# Patient Record
Sex: Female | Born: 1940
Health system: Southern US, Community
[De-identification: ages and names within clinical notes are randomized; demographics above are authoritative.]

## PROBLEM LIST (undated history)

## (undated) DIAGNOSIS — I499 Cardiac arrhythmia, unspecified: Secondary | ICD-10-CM

## (undated) DIAGNOSIS — R0600 Dyspnea, unspecified: Secondary | ICD-10-CM

## (undated) DIAGNOSIS — M109 Gout, unspecified: Secondary | ICD-10-CM

## (undated) DIAGNOSIS — D0510 Intraductal carcinoma in situ of unspecified breast: Secondary | ICD-10-CM

## (undated) DIAGNOSIS — Z923 Personal history of irradiation: Secondary | ICD-10-CM

## (undated) DIAGNOSIS — I2510 Atherosclerotic heart disease of native coronary artery without angina pectoris: Secondary | ICD-10-CM

## (undated) DIAGNOSIS — I358 Other nonrheumatic aortic valve disorders: Secondary | ICD-10-CM

## (undated) DIAGNOSIS — I4891 Unspecified atrial fibrillation: Secondary | ICD-10-CM

## (undated) DIAGNOSIS — I5030 Unspecified diastolic (congestive) heart failure: Secondary | ICD-10-CM

## (undated) DIAGNOSIS — E236 Other disorders of pituitary gland: Secondary | ICD-10-CM

## (undated) DIAGNOSIS — I10 Essential (primary) hypertension: Secondary | ICD-10-CM

## (undated) DIAGNOSIS — E785 Hyperlipidemia, unspecified: Secondary | ICD-10-CM

## (undated) DIAGNOSIS — I480 Paroxysmal atrial fibrillation: Secondary | ICD-10-CM

## (undated) DIAGNOSIS — K219 Gastro-esophageal reflux disease without esophagitis: Secondary | ICD-10-CM

## (undated) DIAGNOSIS — C50919 Malignant neoplasm of unspecified site of unspecified female breast: Secondary | ICD-10-CM

## (undated) DIAGNOSIS — R7303 Prediabetes: Secondary | ICD-10-CM

## (undated) DIAGNOSIS — C4490 Unspecified malignant neoplasm of skin, unspecified: Secondary | ICD-10-CM

## (undated) HISTORY — DX: Intraductal carcinoma in situ of unspecified breast: D05.10

## (undated) HISTORY — DX: Other disorders of pituitary gland: E23.6

## (undated) HISTORY — PX: CHOLECYSTECTOMY: SHX55

## (undated) HISTORY — PX: CATARACT EXTRACTION: SUR2

## (undated) HISTORY — DX: Other nonrheumatic aortic valve disorders: I35.8

## (undated) HISTORY — DX: Hyperlipidemia, unspecified: E78.5

## (undated) HISTORY — DX: Paroxysmal atrial fibrillation: I48.0

## (undated) HISTORY — DX: Atherosclerotic heart disease of native coronary artery without angina pectoris: I25.10

## (undated) HISTORY — PX: ELECTROPHYSIOLOGIC STUDY: SHX172A

## (undated) HISTORY — DX: Gout, unspecified: M10.9

## (undated) HISTORY — PX: OTHER SURGICAL HISTORY: SHX169

## (undated) HISTORY — DX: Prediabetes: R73.03

## (undated) HISTORY — DX: Essential (primary) hypertension: I10

## (undated) HISTORY — PX: ABDOMINAL HYSTERECTOMY: SHX81

## (undated) HISTORY — PX: GASTRIC FUNDOPLICATION: SHX226

---

## 2004-05-17 ENCOUNTER — Ambulatory Visit: Payer: Self-pay | Admitting: Unknown Physician Specialty

## 2004-06-14 ENCOUNTER — Ambulatory Visit: Payer: Self-pay | Admitting: Unknown Physician Specialty

## 2004-06-16 ENCOUNTER — Ambulatory Visit: Payer: Self-pay | Admitting: Unknown Physician Specialty

## 2004-07-28 ENCOUNTER — Ambulatory Visit: Payer: Self-pay | Admitting: Surgery

## 2005-03-28 ENCOUNTER — Ambulatory Visit: Payer: Self-pay | Admitting: Internal Medicine

## 2005-04-14 ENCOUNTER — Ambulatory Visit: Payer: Self-pay | Admitting: Surgery

## 2005-08-29 ENCOUNTER — Ambulatory Visit: Payer: Self-pay | Admitting: Rheumatology

## 2006-05-04 ENCOUNTER — Ambulatory Visit: Payer: Self-pay | Admitting: Internal Medicine

## 2007-05-09 ENCOUNTER — Ambulatory Visit: Payer: Self-pay | Admitting: Internal Medicine

## 2007-10-24 ENCOUNTER — Ambulatory Visit: Payer: Self-pay | Admitting: Cardiology

## 2008-05-21 ENCOUNTER — Ambulatory Visit: Payer: Self-pay | Admitting: Internal Medicine

## 2009-04-08 ENCOUNTER — Ambulatory Visit: Payer: Self-pay | Admitting: Podiatry

## 2009-07-15 ENCOUNTER — Ambulatory Visit: Payer: Self-pay | Admitting: Internal Medicine

## 2009-07-23 ENCOUNTER — Ambulatory Visit: Payer: Self-pay | Admitting: Internal Medicine

## 2009-08-01 DIAGNOSIS — C4490 Unspecified malignant neoplasm of skin, unspecified: Secondary | ICD-10-CM

## 2009-08-01 HISTORY — DX: Unspecified malignant neoplasm of skin, unspecified: C44.90

## 2009-08-18 ENCOUNTER — Ambulatory Visit: Payer: Self-pay | Admitting: Unknown Physician Specialty

## 2010-01-12 ENCOUNTER — Ambulatory Visit: Payer: Self-pay | Admitting: Internal Medicine

## 2010-07-19 ENCOUNTER — Ambulatory Visit: Payer: Self-pay | Admitting: Internal Medicine

## 2011-07-27 ENCOUNTER — Ambulatory Visit: Payer: Self-pay | Admitting: Family Medicine

## 2011-08-04 ENCOUNTER — Ambulatory Visit: Payer: Self-pay | Admitting: Family Medicine

## 2012-03-22 ENCOUNTER — Ambulatory Visit: Payer: Self-pay | Admitting: Family Medicine

## 2012-08-02 ENCOUNTER — Ambulatory Visit: Payer: Self-pay | Admitting: Family Medicine

## 2013-08-01 DIAGNOSIS — C50919 Malignant neoplasm of unspecified site of unspecified female breast: Secondary | ICD-10-CM

## 2013-08-01 HISTORY — DX: Malignant neoplasm of unspecified site of unspecified female breast: C50.919

## 2013-08-01 HISTORY — PX: BREAST BIOPSY: SHX20

## 2013-08-16 ENCOUNTER — Ambulatory Visit: Payer: Self-pay | Admitting: Family Medicine

## 2013-08-19 ENCOUNTER — Ambulatory Visit: Payer: Self-pay | Admitting: Family Medicine

## 2013-08-26 ENCOUNTER — Ambulatory Visit: Payer: Self-pay | Admitting: Surgery

## 2013-08-27 LAB — PATHOLOGY REPORT

## 2013-09-05 ENCOUNTER — Ambulatory Visit: Payer: Self-pay | Admitting: Surgery

## 2013-09-05 LAB — CBC
HCT: 40.7 % (ref 35.0–47.0)
HGB: 13.6 g/dL (ref 12.0–16.0)
MCH: 31.5 pg (ref 26.0–34.0)
MCHC: 33.4 g/dL (ref 32.0–36.0)
MCV: 94 fL (ref 80–100)
Platelet: 248 10*3/uL (ref 150–440)
RBC: 4.32 10*6/uL (ref 3.80–5.20)
RDW: 15.3 % — ABNORMAL HIGH (ref 11.5–14.5)
WBC: 7.9 10*3/uL (ref 3.6–11.0)

## 2013-09-12 ENCOUNTER — Ambulatory Visit: Payer: Self-pay | Admitting: Surgery

## 2013-09-23 HISTORY — PX: BREAST LUMPECTOMY: SHX2

## 2013-09-23 LAB — PATHOLOGY REPORT

## 2013-09-24 ENCOUNTER — Ambulatory Visit: Payer: Self-pay | Admitting: Oncology

## 2013-09-29 ENCOUNTER — Ambulatory Visit: Payer: Self-pay | Admitting: Oncology

## 2013-10-01 ENCOUNTER — Ambulatory Visit: Payer: Self-pay | Admitting: Oncology

## 2013-10-23 LAB — CBC CANCER CENTER
BASOS PCT: 0.8 %
Basophil #: 0 x10 3/mm (ref 0.0–0.1)
Eosinophil #: 0.2 x10 3/mm (ref 0.0–0.7)
Eosinophil %: 3.7 %
HCT: 40.6 % (ref 35.0–47.0)
HGB: 13.1 g/dL (ref 12.0–16.0)
LYMPHS ABS: 1.6 x10 3/mm (ref 1.0–3.6)
Lymphocyte %: 26.7 %
MCH: 30.3 pg (ref 26.0–34.0)
MCHC: 32.2 g/dL (ref 32.0–36.0)
MCV: 94 fL (ref 80–100)
MONOS PCT: 9.1 %
Monocyte #: 0.5 x10 3/mm (ref 0.2–0.9)
NEUTROS ABS: 3.6 x10 3/mm (ref 1.4–6.5)
NEUTROS PCT: 59.7 %
PLATELETS: 254 x10 3/mm (ref 150–440)
RBC: 4.31 10*6/uL (ref 3.80–5.20)
RDW: 15.4 % — ABNORMAL HIGH (ref 11.5–14.5)
WBC: 6 x10 3/mm (ref 3.6–11.0)

## 2013-10-30 ENCOUNTER — Ambulatory Visit: Payer: Self-pay | Admitting: Oncology

## 2013-10-30 LAB — CBC CANCER CENTER
BASOS PCT: 0.6 %
Basophil #: 0 x10 3/mm (ref 0.0–0.1)
EOS PCT: 3.7 %
Eosinophil #: 0.2 x10 3/mm (ref 0.0–0.7)
HCT: 39.6 % (ref 35.0–47.0)
HGB: 12.9 g/dL (ref 12.0–16.0)
LYMPHS PCT: 27.3 %
Lymphocyte #: 1.7 x10 3/mm (ref 1.0–3.6)
MCH: 30.8 pg (ref 26.0–34.0)
MCHC: 32.7 g/dL (ref 32.0–36.0)
MCV: 94 fL (ref 80–100)
MONO ABS: 0.6 x10 3/mm (ref 0.2–0.9)
MONOS PCT: 9.9 %
NEUTROS ABS: 3.7 x10 3/mm (ref 1.4–6.5)
Neutrophil %: 58.5 %
PLATELETS: 230 x10 3/mm (ref 150–440)
RBC: 4.2 10*6/uL (ref 3.80–5.20)
RDW: 15.7 % — ABNORMAL HIGH (ref 11.5–14.5)
WBC: 6.3 x10 3/mm (ref 3.6–11.0)

## 2013-11-06 LAB — CBC CANCER CENTER
Basophil #: 0 x10 3/mm (ref 0.0–0.1)
Basophil %: 0.8 %
EOS PCT: 4.2 %
Eosinophil #: 0.2 x10 3/mm (ref 0.0–0.7)
HCT: 39 % (ref 35.0–47.0)
HGB: 12.7 g/dL (ref 12.0–16.0)
Lymphocyte #: 1 x10 3/mm (ref 1.0–3.6)
Lymphocyte %: 19.9 %
MCH: 30.7 pg (ref 26.0–34.0)
MCHC: 32.6 g/dL (ref 32.0–36.0)
MCV: 94 fL (ref 80–100)
MONO ABS: 0.6 x10 3/mm (ref 0.2–0.9)
Monocyte %: 12.4 %
NEUTROS ABS: 3.3 x10 3/mm (ref 1.4–6.5)
Neutrophil %: 62.7 %
Platelet: 229 x10 3/mm (ref 150–440)
RBC: 4.15 10*6/uL (ref 3.80–5.20)
RDW: 15.1 % — AB (ref 11.5–14.5)
WBC: 5.2 x10 3/mm (ref 3.6–11.0)

## 2013-11-13 LAB — CBC CANCER CENTER
Basophil #: 0.1 x10 3/mm (ref 0.0–0.1)
Basophil %: 0.8 %
EOS ABS: 0.3 x10 3/mm (ref 0.0–0.7)
EOS PCT: 4.1 %
HCT: 39.4 % (ref 35.0–47.0)
HGB: 13 g/dL (ref 12.0–16.0)
LYMPHS PCT: 18.8 %
Lymphocyte #: 1.2 x10 3/mm (ref 1.0–3.6)
MCH: 31.1 pg (ref 26.0–34.0)
MCHC: 32.9 g/dL (ref 32.0–36.0)
MCV: 95 fL (ref 80–100)
MONOS PCT: 9 %
Monocyte #: 0.6 x10 3/mm (ref 0.2–0.9)
NEUTROS ABS: 4.5 x10 3/mm (ref 1.4–6.5)
NEUTROS PCT: 67.3 %
PLATELETS: 242 x10 3/mm (ref 150–440)
RBC: 4.17 10*6/uL (ref 3.80–5.20)
RDW: 15.3 % — ABNORMAL HIGH (ref 11.5–14.5)
WBC: 6.6 x10 3/mm (ref 3.6–11.0)

## 2013-11-20 LAB — CBC CANCER CENTER
Basophil #: 0.1 x10 3/mm (ref 0.0–0.1)
Basophil %: 1 %
EOS PCT: 3.3 %
Eosinophil #: 0.2 x10 3/mm (ref 0.0–0.7)
HCT: 40.4 % (ref 35.0–47.0)
HGB: 13.2 g/dL (ref 12.0–16.0)
LYMPHS PCT: 15.6 %
Lymphocyte #: 0.9 x10 3/mm — ABNORMAL LOW (ref 1.0–3.6)
MCH: 30.6 pg (ref 26.0–34.0)
MCHC: 32.6 g/dL (ref 32.0–36.0)
MCV: 94 fL (ref 80–100)
MONO ABS: 0.7 x10 3/mm (ref 0.2–0.9)
Monocyte %: 12.5 %
NEUTROS PCT: 67.6 %
Neutrophil #: 3.9 x10 3/mm (ref 1.4–6.5)
PLATELETS: 219 x10 3/mm (ref 150–440)
RBC: 4.3 10*6/uL (ref 3.80–5.20)
RDW: 15.5 % — ABNORMAL HIGH (ref 11.5–14.5)
WBC: 5.8 x10 3/mm (ref 3.6–11.0)

## 2013-11-27 LAB — CBC CANCER CENTER
Basophil #: 0 x10 3/mm (ref 0.0–0.1)
Basophil %: 0.9 %
Eosinophil #: 0.2 x10 3/mm (ref 0.0–0.7)
Eosinophil %: 4.1 %
HCT: 37.3 % (ref 35.0–47.0)
HGB: 12.7 g/dL (ref 12.0–16.0)
Lymphocyte #: 0.8 x10 3/mm — ABNORMAL LOW (ref 1.0–3.6)
Lymphocyte %: 14.8 %
MCH: 31.4 pg (ref 26.0–34.0)
MCHC: 34.1 g/dL (ref 32.0–36.0)
MCV: 92 fL (ref 80–100)
Monocyte #: 0.6 x10 3/mm (ref 0.2–0.9)
Monocyte %: 10.8 %
Neutrophil #: 3.9 x10 3/mm (ref 1.4–6.5)
Neutrophil %: 69.4 %
Platelet: 221 x10 3/mm (ref 150–440)
RBC: 4.06 10*6/uL (ref 3.80–5.20)
RDW: 14.9 % — AB (ref 11.5–14.5)
WBC: 5.6 x10 3/mm (ref 3.6–11.0)

## 2013-11-29 ENCOUNTER — Ambulatory Visit: Payer: Self-pay | Admitting: Oncology

## 2013-12-30 ENCOUNTER — Ambulatory Visit: Payer: Self-pay | Admitting: Oncology

## 2014-03-04 ENCOUNTER — Ambulatory Visit: Payer: Self-pay | Admitting: Oncology

## 2014-03-14 DIAGNOSIS — Z9889 Other specified postprocedural states: Secondary | ICD-10-CM | POA: Insufficient documentation

## 2014-03-14 DIAGNOSIS — C50919 Malignant neoplasm of unspecified site of unspecified female breast: Secondary | ICD-10-CM | POA: Insufficient documentation

## 2014-03-14 DIAGNOSIS — K219 Gastro-esophageal reflux disease without esophagitis: Secondary | ICD-10-CM | POA: Insufficient documentation

## 2014-04-01 ENCOUNTER — Ambulatory Visit: Payer: Self-pay | Admitting: Oncology

## 2014-06-06 ENCOUNTER — Ambulatory Visit: Payer: Self-pay | Admitting: Oncology

## 2014-07-01 ENCOUNTER — Ambulatory Visit: Payer: Self-pay | Admitting: Oncology

## 2014-07-16 DIAGNOSIS — R1310 Dysphagia, unspecified: Secondary | ICD-10-CM | POA: Insufficient documentation

## 2014-07-16 DIAGNOSIS — Z8371 Family history of colonic polyps: Secondary | ICD-10-CM | POA: Insufficient documentation

## 2014-08-18 ENCOUNTER — Ambulatory Visit: Payer: Self-pay | Admitting: Oncology

## 2014-08-29 ENCOUNTER — Ambulatory Visit: Payer: Self-pay | Admitting: Unknown Physician Specialty

## 2014-11-22 NOTE — Consult Note (Signed)
Reason for Visit: This 74 year old Female patient presents to the clinic for initial evaluation of  breast cancer .   Referred by Dr. Tamala Julian.  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 to receive whole breast radiation followed by tamoxifen therapy  Pathology Report pathology report reviewed   Imaging Report mammograms reviewed   Referral Report clinical notes reviewed   Planned Treatment Regimen whole breast radiation followed by tamoxifen therapy   HPI   patient is a pleasant 74 year old female who presents with an abnormal mammogram of left breast showing approximately 3 mm focus of slightly heterogeneous calcifications in the area quadrant of left breast for which stereotactic guided biopsy was recommended.she underwent biopsy which was positive for ductal carcinoma in situ. One aunt had a wide local excision for a 1.9 cm region of grade 3 ductal carcinoma in situ ER PR positive. Margins were clear at less than 1 mm. Patient tolerated her surgery well. She's been seen by medical oncology and recommendation for tamoxifen therapy has been made. She specifically denies breast tenderness cough or bone pain.  Past Hx:    HTN:    Breast Cancer:   Past, Family and Social History:  Past Medical History positive   Cardiovascular coronary artery disease; hyperlipidemia; hypertension   Endocrine borderline diabetes   Past Surgical History cholecystectomy; hysterectomy, fundoplication   Family History noncontributory   Social History noncontributory   Additional Past Medical and Surgical History accompanied by husband today   Allergies:   No Known Allergies:   Home Meds:  Home Medications: Medication Instructions Status  hydrochlorothiazide 25 mg oral tablet 1 tab(s) orally once a day Active  losartan 50 mg oral tablet 1 tab(s) orally once a day (in the morning) Active   allopurinol 300 mg oral tablet 1 tab(s) orally once a day Active  atorvastatin 20 mg oral tablet 1 tab(s) orally once a day (at bedtime) Active  Metoprolol Succinate ER 50 mg oral tablet, extended release 1 tab(s) orally once a day (in the morning) Active  Aspir 81 81 mg oral tablet 1 tab(s) orally once a day Active   Review of Systems:  General negative   Performance Status (ECOG) 0   Skin negative   Breast see HPI   Ophthalmologic negative   ENMT negative   Respiratory and Thorax negative   Cardiovascular negative   Gastrointestinal negative   Genitourinary negative   Musculoskeletal negative   Neurological negative   Psychiatric negative   Hematology/Lymphatics negative   Endocrine negative   Allergic/Immunologic negative   Review of Systems   review of systems obtained from nurses notes.  Nursing Notes:  Nursing Vital Signs and Chemo Nursing Nursing Notes: *CC Vital Signs Flowsheet:   04-Mar-15 13:11  Temp Temperature 96.5  Pulse Pulse 59  Respirations Respirations 20  SBP SBP 162  DBP DBP 92  Pain Scale (0-10)  0  Current Weight (kg) (kg) 84.9  Height (cm) centimeters 156  BSA (m2) 1.8   Physical Exam:  General/Skin/HEENT:  General normal   Skin normal   Eyes normal   ENMT normal   Head and Neck normal   Additional PE well-developed slightly obese female in NAD. She status post wide local excision of left breast in the lateral portion of the breast which is well healed. No dominant mass or nodularity is noted in either breast into position examined. No axillary or  supraclavicular adenopathy is appreciated. Lungs are clear to A&P cardiac examination shows regular rate and rhythm.   Breasts/Resp/CV/GI/GU:  Respiratory and Thorax normal   Cardiovascular normal   Gastrointestinal normal   Genitourinary normal   MS/Neuro/Psych/Lymph:  Musculoskeletal normal   Neurological normal   Lymphatics normal   Other Results:  Radiology  Results: LabUnknown:    19-Jan-15 08:42, Digital Additional Views Lt Breast [facility])  PACS Image   [facility]:  Digital Additional Views Lt Breast (SCR)   REASON FOR EXAM:    AV LT CALCIFICATION  COMMENTS:       PROCEDURE: MAM - MAM DGTL ADD VW LT  SCR  - Aug 19 2013  8:42AM     CLINICAL DATA:  74 year old female with developing left breast  calcifications on screening mammogram.    EXAM:  DIGITAL DIAGNOSTIC  LEFT MAMMOGRAM    COMPARISON:  08/16/2013 and prior mammograms dating back to  02/12/2004    ACR Breast Density Category c: The breast tissue is heterogeneously  dense, which may obscure small masses.    FINDINGS:  Magnification views of the left breast demonstrate a 3 x 6 x 4 mm  group of slightly heterogeneous calcifications within the outer left  breast, middle third. There are a few linear forms present. No  associated mass or distortion identified.     IMPRESSION:  Suspicious left breast calcifications - stereotactic guided biopsy  is recommended. Marland Kitchen    RECOMMENDATION:  Stereotactic guided left breast biopsy, which will be arranged by  our department and the patient informed.  I have discussed the findings and recommendations with the patient.  Results were also provided in writing at the conclusion of the  visit. If applicable, a reminder letter will be sent to the patient  regarding the next appointment.    BI-RADS CATEGORY  4: Suspicious abnormality - biopsy should be  considered.      Electronically Signed    By: Hassan Rowan M.D.    On: 08/19/2013 15:18         Verified By: Lura Em, M.D.,   Relevent Results:   Relevant Scans and Labs mammograms reviewed.   Assessment and Plan: Impression:   ductal carcinoma in situ high-grade status post wide local excision of the left breast in 74 year old female tumor is ER/PR positive. Plan:   at this time I recommended whole breast radiation therapy. We'll plan on delivering 5000 cGy over 5 weeks and  boosting her scar another 1600 cGy based on the close margin. Risks and benefits of treatment including skin reaction, fatigue, inclusion of possible superficial lung, possible alteration of blood counts all were explained in detail to the patient. She seems to comprehend my treatment plan well. I have set her up for CT simulation later this week. Patient will also be candidate for tamoxifen therapy after completion of radiation.  I would like to take this opportunity for allowing me to participate in the care of your patient..  CC Referral:  cc: Dr. Sammie Bench, Richarda Overlie   Electronic Signatures: Baruch Gouty, Roda Shutters (MD)  (Signed 04-Mar-15 14:44)  Authored: HPI, Diagnosis, Past Hx, PFSH, Allergies, Home Meds, ROS, Nursing Notes, Physical Exam, Other Results, Relevent Results, Encounter Assessment and Plan, CC Referring Physician   Last Updated: 04-Mar-15 14:44 by Armstead Peaks (MD)

## 2014-11-22 NOTE — Op Note (Signed)
PATIENT NAME:  Charlene Coleman, Charlene Coleman MR#:  [id number] DATE OF BIRTH:  [birth year]  DATE OF PROCEDURE:  09/12/2013  PREOPERATIVE DIAGNOSIS: Ductal carcinoma in situ of the left breast.   POSTOPERATIVE DIAGNOSIS: Ductal carcinoma in situ of the left breast.   PROCEDURE: Left partial mastectomy.   SURGEON: Loreli Dollar, MD   ANESTHESIA: General.   INDICATIONS: This 74 year old female recently had a mammogram depicting a 6 mm cluster of microcalcifications in the lateral aspect of the left breast. Stereotactic needle biopsy demonstrated ductal carcinoma in situ, and surgery was recommended for definitive treatment.   DESCRIPTION OF PROCEDURE: The patient was placed on the operating table in the supine position under general anesthesia. The dressing was removed from the left breast, exposing the Kopans wire, which entered the breast at approximately 4 o'clock position. The wire was cut 2 cm from the skin. The breast was prepared with ChloraPrep and draped in a sterile manner.   I reviewed the mammogram images, seeing the location of the biopsy marker and proximity to the wire. A curvilinear incision was made from approximately 2 o'clock to 5 o'clock position and dissected down. Did remove a narrow ellipse of skin and dissected down to the Kopans wire. Next, a sample of tissue surrounding the Kopans wire was resected and removed the Kopans wire with the specimen. There was a mild degree of firmness within the specimen. This was submitted for specimen mammogram and pathology.   The wound was inspected. There was no remaining mass within the wound. Several small bleeding points were cauterized. Hemostasis was subsequently intact. Subcutaneous tissues were infiltrated with 0.5% Sensorcaine with epinephrine. The subcutaneous tissues were approximated with interrupted 4-0 chromic. The skin was closed with running 4-0 Monocryl subcuticular suture.   The radiologist called to report that the biopsy marker was seen  in the specimen, and later the pathologist called back to report that the margins appeared to be satisfactory. It is noted that we did mark the specimen with the margin map to mark the medial, lateral, cranial, caudal and deep margins, and also the 5 o'clock end of the skin ellipse was tagged with a nylon stitch, and this was for the pathologist's orientation, and specimen was submitted for routine studies as well.   The wound was treated with Dermabond. The patient tolerated surgery satisfactorily and was then prepared for transfer to the recovery room.   ____________________________ Lenna Sciara. Rochel Brome, MD jws:lb D: 09/12/2013 11:43:32 ET T: 09/12/2013 12:12:01 ET JOB#: [id number]  cc: Loreli Dollar, MD, <Dictator> Loreli Dollar MD ELECTRONICALLY SIGNED 09/13/2013 20:11

## 2014-11-24 LAB — SURGICAL PATHOLOGY

## 2015-01-13 ENCOUNTER — Inpatient Hospital Stay: Payer: Medicare Other | Attending: Oncology | Admitting: Oncology

## 2015-01-13 ENCOUNTER — Encounter: Payer: Self-pay | Admitting: Oncology

## 2015-01-13 VITALS — BP 135/83 | HR 61 | Temp 97.6°F | Resp 18 | Wt 180.1 lb

## 2015-01-13 DIAGNOSIS — Z7982 Long term (current) use of aspirin: Secondary | ICD-10-CM | POA: Insufficient documentation

## 2015-01-13 DIAGNOSIS — I2510 Atherosclerotic heart disease of native coronary artery without angina pectoris: Secondary | ICD-10-CM | POA: Diagnosis not present

## 2015-01-13 DIAGNOSIS — Z79899 Other long term (current) drug therapy: Secondary | ICD-10-CM | POA: Insufficient documentation

## 2015-01-13 DIAGNOSIS — Z170 Estrogen receptor positive status [ER+]: Secondary | ICD-10-CM | POA: Insufficient documentation

## 2015-01-13 DIAGNOSIS — D0512 Intraductal carcinoma in situ of left breast: Secondary | ICD-10-CM

## 2015-01-13 DIAGNOSIS — I10 Essential (primary) hypertension: Secondary | ICD-10-CM | POA: Diagnosis not present

## 2015-01-13 DIAGNOSIS — E785 Hyperlipidemia, unspecified: Secondary | ICD-10-CM

## 2015-01-13 DIAGNOSIS — Z79810 Long term (current) use of selective estrogen receptor modulators (SERMs): Secondary | ICD-10-CM

## 2015-01-13 DIAGNOSIS — Z923 Personal history of irradiation: Secondary | ICD-10-CM

## 2015-01-13 DIAGNOSIS — R7309 Other abnormal glucose: Secondary | ICD-10-CM | POA: Diagnosis not present

## 2015-01-13 DIAGNOSIS — Z90710 Acquired absence of both cervix and uterus: Secondary | ICD-10-CM | POA: Diagnosis not present

## 2015-01-13 NOTE — Progress Notes (Signed)
Patient had a recent breast exam at her f/u with Dr. Tamala Julian.  Her last mammogram was 08/2014 and was advised to have mammogram in 01/2015.

## 2015-01-26 ENCOUNTER — Other Ambulatory Visit: Payer: Self-pay

## 2015-02-01 DIAGNOSIS — D0512 Intraductal carcinoma in situ of left breast: Secondary | ICD-10-CM | POA: Insufficient documentation

## 2015-02-01 NOTE — Progress Notes (Signed)
[place]  [phone] Fax:([phone]) [phone]  ID: Charlene Coleman OB: Nov 06, 1940  MR#: [id number]  TIR#:[id number]  Patient Care Team: Dion Body, MD as PCP - General (Family Medicine)  CHIEF COMPLAINT:  Chief Complaint  Patient presents with  . Follow-up    DCIS    INTERVAL HISTORY: Patient returns to clinic today for routine 6 month evaluation. She is tolerating tamoxifen well without significant side effects. She has no neurologic complaints.  She denies any recent fevers or illnesses.  She has a good appetite and denies weight loss. She denies any chest pain or shortness of breath.  She denies any nausea, vomiting, constipation, or diarrhea.  She has no urinary complaints.  Patient offers no specific complaints today.   REVIEW OF SYSTEMS:   Review of Systems  Constitutional: Negative.   Respiratory: Negative.   Cardiovascular: Negative.   Musculoskeletal: Negative.     As per HPI. Otherwise, a complete review of systems is negatve.  PAST MEDICAL HISTORY: Past Medical History  Diagnosis Date  . DCIS (ductal carcinoma in situ)   . Hypertension   . Hyperlipidemia   . Empty sella syndrome   . Gout   . CAD (coronary artery disease)   . Borderline diabetes     PAST SURGICAL HISTORY: Past Surgical History  Procedure Laterality Date  . Cholecystectomy    . Abdominal hysterectomy    . Left lumpectomy    . Gastric fundoplication      FAMILY HISTORY: Reviewed and unchanged. No reported history of malignancy or chronic disease.     ADVANCED DIRECTIVES:    HEALTH MAINTENANCE: History  Substance Use Topics  . Smoking status: Never Smoker   . Smokeless tobacco: Never Used  . Alcohol Use: No     Colonoscopy:  PAP:  Bone density:  Lipid panel:  Allergies  Allergen Reactions  . Vasotec  [Enalapril] Cough    Current Outpatient Prescriptions  Medication Sig Dispense Refill  . allopurinol (ZYLOPRIM) 300 MG tablet     .  aspirin EC 81 MG tablet Take by mouth.    Marland Kitchen atorvastatin (LIPITOR) 20 MG tablet     . Calcium Carbonate-Vitamin D 600-400 MG-UNIT per tablet Take by mouth.    . hydrochlorothiazide (HYDRODIURIL) 25 MG tablet     . losartan (COZAAR) 100 MG tablet     . metoprolol succinate (TOPROL-XL) 50 MG 24 hr tablet     . tamoxifen (NOLVADEX) 20 MG tablet Take by mouth.     No current facility-administered medications for this visit.    OBJECTIVE: Filed Vitals:   01/13/15 1048  BP: 135/83  Pulse: 61  Temp: 97.6 F (36.4 C)  Resp: 18     There is no height on file to calculate BMI.    ECOG FS:0 - Asymptomatic  General: Well-developed, well-nourished, no acute distress. Eyes: anicteric sclera. Breasts: Bilateral breasts and axilla without lumps or masses. Lungs: Clear to auscultation bilaterally. Heart: Regular rate and rhythm. No rubs, murmurs, or gallops. Abdomen: Soft, nontender, nondistended. No organomegaly noted, normoactive bowel sounds. Musculoskeletal: No edema, cyanosis, or clubbing. Neuro: Alert, answering all questions appropriately. Cranial nerves grossly intact. Skin: No rashes or petechiae noted. Psych: Normal affect.   LAB RESULTS:  No results found for: NA, K, CL, CO2, GLUCOSE, BUN, CREATININE, CALCIUM, PROT, ALBUMIN, AST, ALT, ALKPHOS, BILITOT, GFRNONAA, GFRAA  Lab Results  Component Value Date   WBC 5.6 11/27/2013   NEUTROABS 3.9 11/27/2013   HGB 12.7 11/27/2013  HCT 37.3 11/27/2013   MCV 92 11/27/2013   PLT 221 11/27/2013     STUDIES: No results found.  ASSESSMENT: DCIS.  PLAN:    1.  DCIS: No evidence of invasive component.  Patient completed XRT in May of 2015. Continue tamoxifen for 5 years completing in May of 2020. Patient's most recent mammogram of the left breast revealed BI-RADS 3, repeat in the next 1-2 weeks. Return to clinic in 6 months for routine evaluation.    Patient expressed understanding and was in agreement with this plan. She also  understands that She can call clinic at any time with any questions, concerns, or complaints.   Lloyd Huger, MD   02/01/2015 5:09 PM

## 2015-02-17 ENCOUNTER — Ambulatory Visit
Admission: RE | Admit: 2015-02-17 | Discharge: 2015-02-17 | Disposition: A | Discharge status: Home / Self Care | Payer: Medicare Other | Source: Ambulatory Visit | Attending: Oncology | Admitting: Oncology

## 2015-02-17 ENCOUNTER — Other Ambulatory Visit: Payer: Self-pay | Admitting: Oncology

## 2015-02-17 ENCOUNTER — Ambulatory Visit: Payer: Medicare Other

## 2015-02-17 DIAGNOSIS — D0512 Intraductal carcinoma in situ of left breast: Secondary | ICD-10-CM

## 2015-02-17 HISTORY — DX: Unspecified malignant neoplasm of skin, unspecified: C44.90

## 2015-02-17 HISTORY — DX: Malignant neoplasm of unspecified site of unspecified female breast: C50.919

## 2015-02-21 ENCOUNTER — Other Ambulatory Visit: Payer: Self-pay | Admitting: Family Medicine

## 2015-06-12 ENCOUNTER — Ambulatory Visit
Admission: RE | Admit: 2015-06-12 | Discharge: 2015-06-12 | Disposition: A | Discharge status: Home / Self Care | Payer: Medicare Other | Source: Ambulatory Visit | Attending: Radiation Oncology | Admitting: Radiation Oncology

## 2015-06-12 ENCOUNTER — Encounter: Payer: Self-pay | Admitting: Radiation Oncology

## 2015-06-12 VITALS — BP 142/76 | HR 52 | Temp 98.0°F | Wt 182.1 lb

## 2015-06-12 DIAGNOSIS — Z853 Personal history of malignant neoplasm of breast: Secondary | ICD-10-CM

## 2015-06-12 NOTE — Progress Notes (Signed)
Radiation Oncology Follow up Note  Name: Charlene Coleman   Date:   06/12/2015 MRN:  [id number] DOB: 1941/04/12    This 74 y.o. female presents to the clinic today for follow-up of ductal carcinoma in situ status post wide local excision and whole breast radiation to her left breast now 18 months out.  REFERRING PROVIDER: No ref. provider found  HPI: Patient is a 74 year old female now 18 months out having completed whole breast radiation to her right breast for ER/PR positive ductal carcinoma in situ status post wide local excision. Seen today in routine follow-up she is doing well.Marland Kitchen Her follow-up mammograms have been BI-RADS 3 with probable benign left breast calcifications. She's on a 6 month follow-up for mammograms and then I think they will switch her once a year. Currently on tamoxifen tolerating that well without side effect. She specifically denies breast tenderness cough or bone pain.  COMPLICATIONS OF TREATMENT: none  FOLLOW UP COMPLIANCE: keeps appointments   PHYSICAL EXAM:  BP 142/76 mmHg  Pulse 52  Temp(Src) 98 F (36.7 C)  Wt 182 lb 1.6 oz (82.6 kg) Lungs are clear to A&P cardiac examination essentially unremarkable with regular rate and rhythm. No dominant mass or nodularity is noted in either breast in 2 positions examined. Incision is well-healed. No axillary or supraclavicular adenopathy is appreciated. Cosmetic result is excellent. Well-developed well-nourished patient in NAD. HEENT reveals PERLA, EOMI, discs not visualized.  Oral cavity is clear. No oral mucosal lesions are identified. Neck is clear without evidence of cervical or supraclavicular adenopathy. Lungs are clear to A&P. Cardiac examination is essentially unremarkable with regular rate and rhythm without murmur rub or thrill. Abdomen is benign with no organomegaly or masses noted. Motor sensory and DTR levels are equal and symmetric in the upper and lower extremities. Cranial nerves II through XII are grossly  intact. Proprioception is intact. No peripheral adenopathy or edema is identified. No motor or sensory levels are noted. Crude visual fields are within normal range.  RADIOLOGY RESULTS: Mammograms are reviewed compatible above-stated findings  PLAN: Present time she is doing well with no evidence of disease now close to 2 years out. I'm please were overall progress. I've asked to see her back in 1 year for follow-up. She knows to call sooner with any concerns. She continues on tamoxifen without side effect. Follow-up mammograms at six-month intervals until the release her to once your follow-up.  I would like to take this opportunity for allowing me to participate in the care of your patient.Armstead Peaks., MD

## 2015-07-12 ENCOUNTER — Emergency Department: Payer: Medicare Other

## 2015-07-12 ENCOUNTER — Emergency Department
Admission: EM | Admit: 2015-07-12 | Discharge: 2015-07-12 | Disposition: A | Discharge status: Home / Self Care | Payer: Medicare Other | Attending: Emergency Medicine | Admitting: Emergency Medicine

## 2015-07-12 DIAGNOSIS — Z7982 Long term (current) use of aspirin: Secondary | ICD-10-CM | POA: Insufficient documentation

## 2015-07-12 DIAGNOSIS — I159 Secondary hypertension, unspecified: Secondary | ICD-10-CM

## 2015-07-12 DIAGNOSIS — R0602 Shortness of breath: Secondary | ICD-10-CM | POA: Diagnosis not present

## 2015-07-12 DIAGNOSIS — Z79899 Other long term (current) drug therapy: Secondary | ICD-10-CM | POA: Insufficient documentation

## 2015-07-12 DIAGNOSIS — I10 Essential (primary) hypertension: Secondary | ICD-10-CM | POA: Diagnosis present

## 2015-07-12 LAB — BASIC METABOLIC PANEL
Anion gap: 8 (ref 5–15)
BUN: 15 mg/dL (ref 6–20)
CHLORIDE: 104 mmol/L (ref 101–111)
CO2: 27 mmol/L (ref 22–32)
CREATININE: 0.89 mg/dL (ref 0.44–1.00)
Calcium: 9.8 mg/dL (ref 8.9–10.3)
GFR calc non Af Amer: >60 mL/min (ref >60)
Glucose, Bld: 92 mg/dL (ref 65–99)
POTASSIUM: 3.6 mmol/L (ref 3.5–5.1)
SODIUM: 139 mmol/L (ref 135–145)

## 2015-07-12 LAB — CBC
HCT: 39 % (ref 35.0–47.0)
Hemoglobin: 13.2 g/dL (ref 12.0–16.0)
MCH: 32.6 pg (ref 26.0–34.0)
MCHC: 33.8 g/dL (ref 32.0–36.0)
MCV: 96.6 fL (ref 80.0–100.0)
PLATELETS: 241 10*3/uL (ref 150–440)
RBC: 4.04 MIL/uL (ref 3.80–5.20)
RDW: 15.3 % — AB (ref 11.5–14.5)
WBC: 6.7 10*3/uL (ref 3.6–11.0)

## 2015-07-12 LAB — TROPONIN I

## 2015-07-12 NOTE — ED Provider Notes (Signed)
[facility] Emergency Department Provider Note   ____________________________________________  Time seen: 1110  I have reviewed the triage vital signs and the nursing notes.   HISTORY  Chief Complaint Hypertension and Shortness of Breath   History limited by: Not Limited   HPI Charlene Coleman is a 74 y.o. female who presents to the emergency department today because of concerns for high blood pressure. Patient states that she did have medication adjustments with her primary care doctor recently. She had been taken off her hydrochlorothiazide. She states that since that time she has been monitoring her blood pressure. Days ago she noted her blood pressure to be getting elevated again so restarted herself on her hydrochlorothiazide. She states that with the high blood pressure she has been having a little bit of chest tightness and shortness of breath. She denies any headache.   Past Medical History  Diagnosis Date  . DCIS (ductal carcinoma in situ)   . Hypertension   . Hyperlipidemia   . Empty sella syndrome ([place])   . Gout   . CAD (coronary artery disease)   . Borderline diabetes   . Breast cancer ([place]) 2015    Left side - radiation - lumpectomy  . Skin cancer 2011    Patient Active Problem List   Diagnosis Date Noted  . Ductal carcinoma in situ (DCIS) of left breast 02/01/2015    Past Surgical History  Procedure Laterality Date  . Cholecystectomy    . Abdominal hysterectomy    . Left lumpectomy    . Gastric fundoplication    . Breast biopsy Left 2015    positive    Current Outpatient Rx  Name  Route  Sig  Dispense  Refill  . hydrochlorothiazide (HYDRODIURIL) 25 MG tablet               . losartan (COZAAR) 100 MG tablet               . metoprolol succinate (TOPROL-XL) 50 MG 24 hr tablet               . allopurinol (ZYLOPRIM) 300 MG tablet               . aspirin EC 81 MG tablet   Oral   Take by mouth.         Marland Kitchen  atorvastatin (LIPITOR) 20 MG tablet               . Calcium Carbonate-Vitamin D 600-400 MG-UNIT per tablet   Oral   Take by mouth.         Marland Kitchen FLUZONE HIGH-DOSE 0.5 ML SUSY      ADM 0.5ML IM UTD      0     Dispense as written.   . tamoxifen (NOLVADEX) 20 MG tablet      TAKE 1 TABLET ONE TIME DAILY   90 tablet   4     Allergies Vasotec   Family History  Problem Relation Age of Onset  . Breast cancer Maternal Aunt 71  . Breast cancer Cousin     Social History Social History  Substance Use Topics  . Smoking status: Never Smoker   . Smokeless tobacco: Never Used  . Alcohol Use: No    Review of Systems  Constitutional: Negative for fever. Cardiovascular: Negative for chest pain. Respiratory: Positive for shortness of breath. Gastrointestinal: Negative for abdominal pain, vomiting and diarrhea. Genitourinary: Negative for dysuria. Musculoskeletal: Negative for back pain. Skin:  Negative for rash. Neurological: Negative for headaches, focal weakness or numbness.  10-point ROS otherwise negative.  ____________________________________________   PHYSICAL EXAM:  VITAL SIGNS: ED Triage Vitals  Enc Vitals Group     BP 07/12/15 0941 198/75 mmHg     Pulse Rate 07/12/15 0940 62     Resp 07/12/15 0940 18     Temp 07/12/15 0940 97.9 F (36.6 C)     Temp Source 07/12/15 0940 Oral     SpO2 07/12/15 0940 97 %     Weight 07/12/15 0941 180 lb (81.647 kg)     Height 07/12/15 0941 5\' 3"  (1.6 m)   Constitutional: Alert and oriented. Well appearing and in no distress. Eyes: Conjunctivae are normal. PERRL. Normal extraocular movements. ENT   Head: Normocephalic and atraumatic.   Nose: No congestion/rhinnorhea.   Mouth/Throat: Mucous membranes are moist.   Neck: No stridor. Hematological/Lymphatic/Immunilogical: No cervical lymphadenopathy. Cardiovascular: Normal rate, regular rhythm.  No murmurs, rubs, or gallops. Respiratory: Normal respiratory  effort without tachypnea nor retractions. Breath sounds are clear and equal bilaterally. No wheezes/rales/rhonchi. Gastrointestinal: Soft and nontender. No distention. There is no CVA tenderness. Genitourinary: Deferred Musculoskeletal: Normal range of motion in all extremities. No joint effusions.  No lower extremity tenderness nor edema. Neurologic:  Normal speech and language. No gross focal neurologic deficits are appreciated.  Skin:  Skin is warm, dry and intact. No rash noted. Psychiatric: Mood and affect are normal. Speech and behavior are normal. Patient exhibits appropriate insight and judgment.  ____________________________________________    LABS (pertinent positives/negatives)  Labs Reviewed  CBC - Abnormal; Notable for the following:    RDW 15.3 (*)    All other components within normal limits  BASIC METABOLIC PANEL  TROPONIN I     ____________________________________________   EKG  I, Nance Pear, attending physician, personally viewed and interpreted this EKG  EKG Time: 0935 Rate: 62 Rhythm: normal sinus rhythm Axis: normal Intervals: qtc 426 QRS: narrow ST changes: no st elevation, t wave inversion V1, V2, V3 Impression: abnormal ekg ____________________________________________    RADIOLOGY  CXR IMPRESSION: No active cardiopulmonary disease.  ____________________________________________   PROCEDURES  Procedure(s) performed: None  Critical Care performed: No  ____________________________________________   INITIAL IMPRESSION / ASSESSMENT AND PLAN / ED COURSE  Pertinent labs & imaging results that were available during my care of the patient were reviewed by me and considered in my medical decision making (see chart for details).  Patient presented to the emergency department today with concerns for elevated blood pressure. Blood pressure did improve on its own here in the emergency department. At this point no signs of acute end organ  damage. I discussed with the patient need to follow up with primary care physician. Patient verbalizes understanding.  ____________________________________________   FINAL CLINICAL IMPRESSION(S) / ED DIAGNOSES  Final diagnoses:  Secondary hypertension, unspecified     Nance Pear, MD 07/12/15 1338

## 2015-07-12 NOTE — ED Notes (Addendum)
Pt here for concerns of high blood pressure.  Denies CP.  Endorses some SOB.  Reports feeling like it is hard to take a breath.

## 2015-07-12 NOTE — Discharge Instructions (Signed)
Please seek medical attention for any high fevers, chest pain, shortness of breath, change in behavior, persistent vomiting, bloody stool or any other new or concerning symptoms.   Hypertension Hypertension, commonly called high blood pressure, is when the force of blood pumping through your arteries is too strong. Your arteries are the blood vessels that carry blood from your heart throughout your body. A blood pressure reading consists of a higher number over a lower number, such as 110/72. The higher number (systolic) is the pressure inside your arteries when your heart pumps. The lower number (diastolic) is the pressure inside your arteries when your heart relaxes. Ideally you want your blood pressure below 120/80. Hypertension forces your heart to work harder to pump blood. Your arteries may become narrow or stiff. Having untreated or uncontrolled hypertension can cause heart attack, stroke, kidney disease, and other problems. RISK FACTORS Some risk factors for high blood pressure are controllable. Others are not.  Risk factors you cannot control include:   Race. You may be at higher risk if you are African American.  Age. Risk increases with age.  Gender. Men are at higher risk than women before age 70 years. After age 59, women are at higher risk than men. Risk factors you can control include:  Not getting enough exercise or physical activity.  Being overweight.  Getting too much fat, sugar, calories, or salt in your diet.  Drinking too much alcohol. SIGNS AND SYMPTOMS Hypertension does not usually cause signs or symptoms. Extremely high blood pressure (hypertensive crisis) may cause headache, anxiety, shortness of breath, and nosebleed. DIAGNOSIS To check if you have hypertension, your health care provider will measure your blood pressure while you are seated, with your arm held at the level of your heart. It should be measured at least twice using the same arm. Certain conditions  can cause a difference in blood pressure between your right and left arms. A blood pressure reading that is higher than normal on one occasion does not mean that you need treatment. If it is not clear whether you have high blood pressure, you may be asked to return on a different day to have your blood pressure checked again. Or, you may be asked to monitor your blood pressure at home for 1 or more weeks. TREATMENT Treating high blood pressure includes making lifestyle changes and possibly taking medicine. Living a healthy lifestyle can help lower high blood pressure. You may need to change some of your habits. Lifestyle changes may include:  Following the DASH diet. This diet is high in fruits, vegetables, and whole grains. It is low in salt, red meat, and added sugars.  Keep your sodium intake below 2,300 mg per day.  Getting at least 30-45 minutes of aerobic exercise at least 4 times per week.  Losing weight if necessary.  Not smoking.  Limiting alcoholic beverages.  Learning ways to reduce stress. Your health care provider may prescribe medicine if lifestyle changes are not enough to get your blood pressure under control, and if one of the following is true:  You are 47-30 years of age and your systolic blood pressure is above 140.  You are 8 years of age or older, and your systolic blood pressure is above 150.  Your diastolic blood pressure is above 90.  You have diabetes, and your systolic blood pressure is over [id number] or your diastolic blood pressure is over 90.  You have kidney disease and your blood pressure is above 140/90.  You have  heart disease and your blood pressure is above 140/90. Your personal target blood pressure may vary depending on your medical conditions, your age, and other factors. HOME CARE INSTRUCTIONS  Have your blood pressure rechecked as directed by your health care provider.   Take medicines only as directed by your health care provider. Follow the  directions carefully. Blood pressure medicines must be taken as prescribed. The medicine does not work as well when you skip doses. Skipping doses also puts you at risk for problems.  Do not smoke.   Monitor your blood pressure at home as directed by your health care provider. SEEK MEDICAL CARE IF:   You think you are having a reaction to medicines taken.  You have recurrent headaches or feel dizzy.  You have swelling in your ankles.  You have trouble with your vision. SEEK IMMEDIATE MEDICAL CARE IF:  You develop a severe headache or confusion.  You have unusual weakness, numbness, or feel faint.  You have severe chest or abdominal pain.  You vomit repeatedly.  You have trouble breathing. MAKE SURE YOU:   Understand these instructions.  Will watch your condition.  Will get help right away if you are not doing well or get worse.   This information is not intended to replace advice given to you by your health care provider. Make sure you discuss any questions you have with your health care provider.   Document Released: 07/18/2005 Document Revised: 12/02/2014 Document Reviewed: 05/10/2013 Elsevier Interactive Patient Education [employer].

## 2015-07-12 NOTE — ED Notes (Signed)
NAD. Family at bedside. Shortness of breath improved. No pain. Respirations unlabored. VSS. Bed low position.  Alert and oriented. Skin warm dry and pink

## 2015-07-16 ENCOUNTER — Inpatient Hospital Stay: Payer: Medicare Other | Attending: Oncology | Admitting: Oncology

## 2015-07-16 VITALS — Temp 98.7°F | Resp 18 | Wt 183.9 lb

## 2015-07-16 DIAGNOSIS — E785 Hyperlipidemia, unspecified: Secondary | ICD-10-CM | POA: Insufficient documentation

## 2015-07-16 DIAGNOSIS — Z7982 Long term (current) use of aspirin: Secondary | ICD-10-CM | POA: Insufficient documentation

## 2015-07-16 DIAGNOSIS — Z79899 Other long term (current) drug therapy: Secondary | ICD-10-CM | POA: Diagnosis not present

## 2015-07-16 DIAGNOSIS — Z85828 Personal history of other malignant neoplasm of skin: Secondary | ICD-10-CM | POA: Insufficient documentation

## 2015-07-16 DIAGNOSIS — I2510 Atherosclerotic heart disease of native coronary artery without angina pectoris: Secondary | ICD-10-CM | POA: Diagnosis not present

## 2015-07-16 DIAGNOSIS — D0512 Intraductal carcinoma in situ of left breast: Secondary | ICD-10-CM | POA: Diagnosis not present

## 2015-07-16 DIAGNOSIS — I10 Essential (primary) hypertension: Secondary | ICD-10-CM

## 2015-07-16 DIAGNOSIS — Z79810 Long term (current) use of selective estrogen receptor modulators (SERMs): Secondary | ICD-10-CM | POA: Insufficient documentation

## 2015-07-16 DIAGNOSIS — M109 Gout, unspecified: Secondary | ICD-10-CM | POA: Insufficient documentation

## 2015-07-16 DIAGNOSIS — Z170 Estrogen receptor positive status [ER+]: Secondary | ICD-10-CM | POA: Insufficient documentation

## 2015-07-16 DIAGNOSIS — Z923 Personal history of irradiation: Secondary | ICD-10-CM | POA: Diagnosis not present

## 2015-07-16 NOTE — Progress Notes (Signed)
Patient has been having trouble getting blood pressure controlled and is being followed by PCP

## 2015-08-01 NOTE — Progress Notes (Signed)
[facility]  [phone] Fax:(336) [phone]  ID: Charlene Coleman OB: 07-Dec-1940  MR#: [id number]  [id number]  Patient Care Team: Dion Body, MD as PCP - General (Family Medicine)  CHIEF COMPLAINT:  Chief Complaint  Patient presents with  . DCIS    INTERVAL HISTORY: Patient returns to clinic today for routine 6 month evaluation. She is tolerating tamoxifen well without significant side effects. She has no neurologic complaints.  She denies any recent fevers or illnesses.  She has a good appetite and denies weight loss. She denies any chest pain or shortness of breath.  She denies any nausea, vomiting, constipation, or diarrhea.  She has no urinary complaints.  Patient offers no specific complaints today.   REVIEW OF SYSTEMS:   Review of Systems  Constitutional: Negative.   Respiratory: Negative.   Cardiovascular: Negative.   Gastrointestinal: Negative.   Musculoskeletal: Negative.   Neurological: Negative.     As per HPI. Otherwise, a complete review of systems is negatve.  PAST MEDICAL HISTORY: Past Medical History  Diagnosis Date  . DCIS (ductal carcinoma in situ)   . Hypertension   . Hyperlipidemia   . Empty sella syndrome ([place])   . Gout   . CAD (coronary artery disease)   . Borderline diabetes   . Breast cancer ([place]) 2015    Left side - radiation - lumpectomy  . Skin cancer 2011    PAST SURGICAL HISTORY: Past Surgical History  Procedure Laterality Date  . Cholecystectomy    . Abdominal hysterectomy    . Left lumpectomy    . Gastric fundoplication    . Breast biopsy Left 2015    positive    FAMILY HISTORY: Reviewed and unchanged. No reported history of malignancy or chronic disease.     ADVANCED DIRECTIVES:    HEALTH MAINTENANCE: Social History  Substance Use Topics  . Smoking status: Never Smoker   . Smokeless tobacco: Never Used  . Alcohol Use: No     Colonoscopy:  PAP:  Bone density:  Lipid  panel:  Allergies  Allergen Reactions  . Vasotec  [Enalapril] Cough    Current Outpatient Prescriptions  Medication Sig Dispense Refill  . allopurinol (ZYLOPRIM) 300 MG tablet     . amLODipine (NORVASC) 2.5 MG tablet Take by mouth.    Marland Kitchen aspirin EC 81 MG tablet Take by mouth.    Marland Kitchen atorvastatin (LIPITOR) 20 MG tablet     . Calcium Carbonate-Vitamin D 600-400 MG-UNIT per tablet Take by mouth.    . losartan (COZAAR) 100 MG tablet     . meloxicam (MOBIC) 15 MG tablet TAKE 1 TABLET (15 MG TOTAL) BY MOUTH DAILY WITH BREAKFAST.  0  . metoprolol succinate (TOPROL-XL) 50 MG 24 hr tablet     . tamoxifen (NOLVADEX) 20 MG tablet TAKE 1 TABLET ONE TIME DAILY 90 tablet 4  . FLUZONE HIGH-DOSE 0.5 ML SUSY Reported on 07/16/2015  0  . hydrochlorothiazide (HYDRODIURIL) 25 MG tablet Reported on 07/16/2015     No current facility-administered medications for this visit.    OBJECTIVE: Filed Vitals:   07/16/15 1104  Temp: 98.7 F (37.1 C)  Resp: 18     Body mass index is 32.58 kg/(m^2).    ECOG FS:0 - Asymptomatic  General: Well-developed, well-nourished, no acute distress. Eyes: anicteric sclera. Breasts: Bilateral breasts and axilla without lumps or masses. Lungs: Clear to auscultation bilaterally. Heart: Regular rate and rhythm. No rubs, murmurs, or gallops. Abdomen: Soft, nontender, nondistended. No  organomegaly noted, normoactive bowel sounds. Musculoskeletal: No edema, cyanosis, or clubbing. Neuro: Alert, answering all questions appropriately. Cranial nerves grossly intact. Skin: No rashes or petechiae noted. Psych: Normal affect.   LAB RESULTS:  Lab Results  Component Value Date   NA 139 07/12/2015   K 3.6 07/12/2015   CL 104 07/12/2015   CO2 27 07/12/2015   GLUCOSE 92 07/12/2015   BUN 15 07/12/2015   CREATININE 0.89 07/12/2015   CALCIUM 9.8 07/12/2015   GFRNONAA >60 07/12/2015   GFRAA >60 07/12/2015    Lab Results  Component Value Date   WBC 6.7 07/12/2015   NEUTROABS  3.9 11/27/2013   HGB 13.2 07/12/2015   HCT 39.0 07/12/2015   MCV 96.6 07/12/2015   PLT 241 07/12/2015     STUDIES: Dg Chest 2 View  07/12/2015  CLINICAL DATA:  Hypertension, chest pain EXAM: CHEST  2 VIEW COMPARISON:  None. FINDINGS: Cardiomediastinal silhouette is unremarkable. No acute infiltrate or pleural effusion. No pulmonary edema. Old fracture of the right sixth rib. Mild elevation of the right hemidiaphragm. Mild degenerative changes mid thoracic spine. IMPRESSION: No active cardiopulmonary disease. Electronically Signed   By: Lahoma Crocker M.D.   On: 07/12/2015 10:27    ASSESSMENT: DCIS.  PLAN:    1.  DCIS: No evidence of invasive component.  Patient completed XRT in May of 2015. Continue tamoxifen for 5 years completing in May of 2020. Patient's most recent mammogram of the left breast on February 17, 2015 revealed BI-RADS 3, repeat in the next 1-2 weeks. Return to clinic in 6 months for routine evaluation.  2. Hypertension: Patient reports difficulty controlling her blood pressure which is being managed by her PCP. Continue current medications as directed.  Patient expressed understanding and was in agreement with this plan. She also understands that She can call clinic at any time with any questions, concerns, or complaints.   Lloyd Huger, MD   08/01/2015 12:45 PM

## 2015-08-20 ENCOUNTER — Ambulatory Visit
Admission: RE | Admit: 2015-08-20 | Discharge: 2015-08-20 | Disposition: A | Discharge status: Home / Self Care | Payer: Medicare Other | Source: Ambulatory Visit | Attending: Oncology | Admitting: Oncology

## 2015-08-20 ENCOUNTER — Other Ambulatory Visit: Payer: Self-pay | Admitting: Oncology

## 2015-08-20 DIAGNOSIS — D0512 Intraductal carcinoma in situ of left breast: Secondary | ICD-10-CM

## 2015-08-20 DIAGNOSIS — Z853 Personal history of malignant neoplasm of breast: Secondary | ICD-10-CM | POA: Diagnosis present

## 2015-08-20 DIAGNOSIS — Z9889 Other specified postprocedural states: Secondary | ICD-10-CM | POA: Insufficient documentation

## 2015-08-20 DIAGNOSIS — Z923 Personal history of irradiation: Secondary | ICD-10-CM | POA: Insufficient documentation

## 2015-11-11 DIAGNOSIS — Z85828 Personal history of other malignant neoplasm of skin: Secondary | ICD-10-CM | POA: Insufficient documentation

## 2016-01-14 ENCOUNTER — Inpatient Hospital Stay: Payer: Medicare Other | Attending: Oncology | Admitting: Oncology

## 2016-01-14 VITALS — BP 173/79 | HR 58 | Temp 95.9°F | Resp 18 | Wt 176.4 lb

## 2016-01-14 DIAGNOSIS — I10 Essential (primary) hypertension: Secondary | ICD-10-CM

## 2016-01-14 DIAGNOSIS — Z79810 Long term (current) use of selective estrogen receptor modulators (SERMs): Secondary | ICD-10-CM | POA: Insufficient documentation

## 2016-01-14 DIAGNOSIS — M109 Gout, unspecified: Secondary | ICD-10-CM

## 2016-01-14 DIAGNOSIS — Z923 Personal history of irradiation: Secondary | ICD-10-CM | POA: Diagnosis not present

## 2016-01-14 DIAGNOSIS — D0512 Intraductal carcinoma in situ of left breast: Secondary | ICD-10-CM | POA: Diagnosis present

## 2016-01-14 DIAGNOSIS — E785 Hyperlipidemia, unspecified: Secondary | ICD-10-CM

## 2016-01-14 DIAGNOSIS — Z170 Estrogen receptor positive status [ER+]: Secondary | ICD-10-CM | POA: Insufficient documentation

## 2016-01-14 DIAGNOSIS — I2510 Atherosclerotic heart disease of native coronary artery without angina pectoris: Secondary | ICD-10-CM

## 2016-01-14 DIAGNOSIS — Z7982 Long term (current) use of aspirin: Secondary | ICD-10-CM | POA: Diagnosis not present

## 2016-01-14 DIAGNOSIS — Z79899 Other long term (current) drug therapy: Secondary | ICD-10-CM | POA: Diagnosis not present

## 2016-01-14 DIAGNOSIS — Z85828 Personal history of other malignant neoplasm of skin: Secondary | ICD-10-CM

## 2016-01-14 NOTE — Progress Notes (Signed)
States is feeling well. Having hot flashes with tamoxifen.

## 2016-01-24 NOTE — Progress Notes (Signed)
[place] [facility]  Telephone:(336) [phone] Fax:(336) [phone]  ID: Charlene Coleman OB: 1941/06/11  MR#: [id number]  [id number]  Patient Care Team: Dion Body, MD as PCP - General (Family Medicine)  CHIEF COMPLAINT:  Chief Complaint  Patient presents with  . DCIS    INTERVAL HISTORY: Patient returns to clinic today for routine 6 month evaluation. She is tolerating tamoxifen, but admit to hot flashes that do not interrupt her day-to-day activity.  She otherwise feels well. She has no neurologic complaints.  She denies any recent fevers or illnesses.  She has a good appetite and denies weight loss. She denies any chest pain or shortness of breath.  She denies any nausea, vomiting, constipation, or diarrhea.  She has no urinary complaints.  Patient offers no further specific complaints today.   REVIEW OF SYSTEMS:   Review of Systems  Constitutional: Negative.  Negative for fever, weight loss and malaise/fatigue.  Respiratory: Negative.  Negative for cough and shortness of breath.   Cardiovascular: Negative.  Negative for chest pain.  Gastrointestinal: Negative.  Negative for abdominal pain.  Genitourinary: Negative.   Musculoskeletal: Negative.   Neurological: Positive for sensory change. Negative for weakness.  Psychiatric/Behavioral: Negative.     As per HPI. Otherwise, a complete review of systems is negatve.  PAST MEDICAL HISTORY: Past Medical History  Diagnosis Date  . DCIS (ductal carcinoma in situ)   . Hypertension   . Hyperlipidemia   . Empty sella syndrome ([place])   . Gout   . CAD (coronary artery disease)   . Borderline diabetes   . Breast cancer ([place]) 2015    Left side - radiation - lumpectomy  . Skin cancer 2011    PAST SURGICAL HISTORY: Past Surgical History  Procedure Laterality Date  . Cholecystectomy    . Abdominal hysterectomy    . Left lumpectomy    . Gastric fundoplication    . Breast biopsy Left 2015    positive  . Breast  lumpectomy Left 09/23/13    FAMILY HISTORY: Reviewed and unchanged. No reported history of malignancy or chronic disease.     ADVANCED DIRECTIVES:    HEALTH MAINTENANCE: Social History  Substance Use Topics  . Smoking status: Never Smoker   . Smokeless tobacco: Never Used  . Alcohol Use: No     Colonoscopy:  PAP:  Bone density:  Lipid panel:  Allergies  Allergen Reactions  . Vasotec  [Enalapril] Cough    Current Outpatient Prescriptions  Medication Sig Dispense Refill  . allopurinol (ZYLOPRIM) 300 MG tablet Take 300 mg by mouth daily.     Marland Kitchen amLODipine (NORVASC) 2.5 MG tablet Take 2.5 mg by mouth daily.     Marland Kitchen aspirin EC 81 MG tablet Take 81 mg by mouth daily.     Marland Kitchen atorvastatin (LIPITOR) 20 MG tablet Take 20 mg by mouth daily at 6 PM.     . Calcium Carbonate-Vitamin D 600-400 MG-UNIT per tablet Take 1 tablet by mouth 3 (three) times a week.     . hydrochlorothiazide (HYDRODIURIL) 25 MG tablet Take 25 mg by mouth daily. Reported on 07/16/2015    . losartan (COZAAR) 100 MG tablet Take 100 mg by mouth daily.     . meloxicam (MOBIC) 15 MG tablet TAKE 1 TABLET (15 MG TOTAL) BY MOUTH DAILY WITH BREAKFAST.  0  . metoprolol succinate (TOPROL-XL) 50 MG 24 hr tablet Take 50 mg by mouth daily.     . tamoxifen (NOLVADEX) 20 MG tablet TAKE  1 TABLET ONE TIME DAILY 90 tablet 4   No current facility-administered medications for this visit.    OBJECTIVE: Filed Vitals:   01/14/16 1008  BP: 173/79  Pulse: 58  Temp: 95.9 F (35.5 C)  Resp: 18     Body mass index is 31.25 kg/(m^2).    ECOG FS:0 - Asymptomatic  General: Well-developed, well-nourished, no acute distress. Eyes: anicteric sclera. Breasts: Bilateral breasts and axilla without lumps or masses. Lungs: Clear to auscultation bilaterally. Heart: Regular rate and rhythm. No rubs, murmurs, or gallops. Abdomen: Soft, nontender, nondistended. No organomegaly noted, normoactive bowel sounds. Musculoskeletal: No edema, cyanosis,  or clubbing. Neuro: Alert, answering all questions appropriately. Cranial nerves grossly intact. Skin: No rashes or petechiae noted. Psych: Normal affect.   LAB RESULTS:  Lab Results  Component Value Date   NA 139 07/12/2015   K 3.6 07/12/2015   CL 104 07/12/2015   CO2 27 07/12/2015   GLUCOSE 92 07/12/2015   BUN 15 07/12/2015   CREATININE 0.89 07/12/2015   CALCIUM 9.8 07/12/2015   GFRNONAA >60 07/12/2015   GFRAA >60 07/12/2015    Lab Results  Component Value Date   WBC 6.7 07/12/2015   NEUTROABS 3.9 11/27/2013   HGB 13.2 07/12/2015   HCT 39.0 07/12/2015   MCV 96.6 07/12/2015   PLT 241 07/12/2015     STUDIES: No results found.  ASSESSMENT: DCIS.  PLAN:    1.  DCIS: No evidence of invasive component.  Patient completed XRT in May of 2015. Continue tamoxifen for 5 years completing in May of 2020. Patient's most recent mammogram of the left breast on August 20, 2015 revealed BI-RADS 3, repeat in July 2017. Return to clinic in 6 months for routine evaluation.  2. Hypertension: Patient reports difficulty controlling her blood pressure which is being managed by her PCP. Continue current medications as directed.  Patient expressed understanding and was in agreement with this plan. She also understands that She can call clinic at any time with any questions, concerns, or complaints.   Lloyd Huger, MD   01/24/2016 9:11 AM

## 2016-02-03 ENCOUNTER — Other Ambulatory Visit: Payer: Self-pay | Admitting: *Deleted

## 2016-02-03 DIAGNOSIS — D0512 Intraductal carcinoma in situ of left breast: Secondary | ICD-10-CM

## 2016-04-28 ENCOUNTER — Other Ambulatory Visit: Payer: Self-pay | Admitting: *Deleted

## 2016-04-28 MED ORDER — TAMOXIFEN CITRATE 20 MG PO TABS: ORAL_TABLET | ORAL | 1 refills | Status: DC

## 2016-05-13 ENCOUNTER — Encounter: Payer: Self-pay | Admitting: *Deleted

## 2016-06-14 ENCOUNTER — Ambulatory Visit
Admission: RE | Admit: 2016-06-14 | Discharge: 2016-06-14 | Disposition: A | Discharge status: Home / Self Care | Payer: Medicare Other | Source: Ambulatory Visit | Attending: Radiation Oncology | Admitting: Radiation Oncology

## 2016-06-14 ENCOUNTER — Encounter: Payer: Self-pay | Admitting: Radiation Oncology

## 2016-06-14 VITALS — BP 163/74 | HR 56 | Temp 96.8°F | Resp 18 | Wt 180.4 lb

## 2016-06-14 DIAGNOSIS — Z79810 Long term (current) use of selective estrogen receptor modulators (SERMs): Secondary | ICD-10-CM | POA: Diagnosis not present

## 2016-06-14 DIAGNOSIS — D0512 Intraductal carcinoma in situ of left breast: Secondary | ICD-10-CM

## 2016-06-14 DIAGNOSIS — Z923 Personal history of irradiation: Secondary | ICD-10-CM | POA: Insufficient documentation

## 2016-06-14 DIAGNOSIS — Z170 Estrogen receptor positive status [ER+]: Secondary | ICD-10-CM | POA: Insufficient documentation

## 2016-06-14 NOTE — Progress Notes (Signed)
Radiation Oncology Follow up Note  Name: Charlene Coleman   Date:   06/14/2016 MRN:  [id number] DOB: 1941/06/20    This 75 y.o. female presents to the clinic today for 2-1/2 year follow-up for whole breast radiation to her left breast for ductal carcinoma in situ.  REFERRING PROVIDER: Dion Body, MD  HPI: Patient is a 75 year old female now out 2 and half years having pleaded whole breast radiation to her left breast status post wide local excision for ductal carcinoma in situ ER/PR positive. She seen today in routine follow-up is doing well. She specifically denies breast tenderness cough or bone pain.. She is currently on tamoxifen tolerating that well without side effect. Her last mammograms were benign with recognition for 6 month follow-up which is been ordering ordered.  COMPLICATIONS OF TREATMENT: none  FOLLOW UP COMPLIANCE: keeps appointments   PHYSICAL EXAM:  BP (!) 163/74   Pulse (!) 56   Temp (!) 96.8 F (36 C)   Resp 18   Wt 180 lb 7.1 oz (81.8 kg)   BMI 31.96 kg/m  Lungs are clear to A&P cardiac examination essentially unremarkable with regular rate and rhythm. No dominant mass or nodularity is noted in either breast in 2 positions examined. Incision is well-healed. No axillary or supraclavicular adenopathy is appreciated. Cosmetic result is excellent. Well-developed well-nourished patient in NAD. HEENT reveals PERLA, EOMI, discs not visualized.  Oral cavity is clear. No oral mucosal lesions are identified. Neck is clear without evidence of cervical or supraclavicular adenopathy. Lungs are clear to A&P. Cardiac examination is essentially unremarkable with regular rate and rhythm without murmur rub or thrill. Abdomen is benign with no organomegaly or masses noted. Motor sensory and DTR levels are equal and symmetric in the upper and lower extremities. Cranial nerves II through XII are grossly intact. Proprioception is intact. No peripheral adenopathy or edema is  identified. No motor or sensory levels are noted. Crude visual fields are within normal range.  RADIOLOGY RESULTS: Most recent mammograms are reviewed and compatible with the above-stated findings  PLAN: Present time patient is doing well with no evidence of disease. She has a 6 month follow-up on her mammogram which I will review when it becomes available. Otherwise she is tolerating her treatments well she continues on tamoxifen without side effect. I have asked to see her back in 1 year for follow-up. Patient is to call sooner with any concerns.  I would like to take this opportunity to thank you for allowing me to participate in the care of your patient.Armstead Peaks., MD

## 2016-06-17 ENCOUNTER — Ambulatory Visit: Payer: Medicare Other | Admitting: Radiation Oncology

## 2016-07-18 ENCOUNTER — Ambulatory Visit: Payer: Medicare Other | Admitting: Oncology

## 2016-08-22 ENCOUNTER — Ambulatory Visit
Admission: RE | Admit: 2016-08-22 | Discharge: 2016-08-22 | Disposition: A | Discharge status: Home / Self Care | Payer: Medicare Other | Source: Ambulatory Visit | Attending: Oncology | Admitting: Oncology

## 2016-08-22 DIAGNOSIS — D0512 Intraductal carcinoma in situ of left breast: Secondary | ICD-10-CM

## 2016-08-22 DIAGNOSIS — Z923 Personal history of irradiation: Secondary | ICD-10-CM | POA: Insufficient documentation

## 2016-08-22 DIAGNOSIS — Z853 Personal history of malignant neoplasm of breast: Secondary | ICD-10-CM | POA: Diagnosis not present

## 2016-08-28 NOTE — Progress Notes (Signed)
[place]  Telephone:(336) [phone] Fax:(336) [phone]  ID: JEANNY MADARIS OB: 10-Nov-1940  MR#: [id number]  [id number]  Patient Care Team: Dion Body, MD as PCP - General (Family Medicine)  CHIEF COMPLAINT: DCIS of left breast.  INTERVAL HISTORY: Patient returns to clinic today for routine 6 month evaluation. She is tolerating tamoxifen, but admit to hot flashes that do not interrupt her day-to-day activity.  She otherwise feels well, and reports exercising regularly with the [employer] program.  She reports a cough she had in December has resolved.  She has no neurologic complaints.  She denies any recent fevers or illnesses.  She has a good appetite and denies weight loss. She denies any chest pain or shortness of breath.  She denies any nausea, vomiting, constipation, or diarrhea.  She reports urinary frequency, no urinary complaints.  Patient offers no further specific complaints today.   REVIEW OF SYSTEMS:   Review of Systems  Constitutional: Negative.  Negative for fever, malaise/fatigue and weight loss.  Respiratory: Negative.  Negative for cough and shortness of breath.   Cardiovascular: Negative.  Negative for chest pain.  Gastrointestinal: Negative.  Negative for abdominal pain.  Genitourinary: Positive for frequency. Negative for hematuria.  Musculoskeletal: Negative.  Negative for falls.  Neurological: Positive for sensory change. Negative for weakness.  Psychiatric/Behavioral: Negative.     As per HPI. Otherwise, a complete review of systems is negative.  PAST MEDICAL HISTORY: Past Medical History:  Diagnosis Date  . Borderline diabetes   . Breast cancer ([place]) 2015   Left side - radiation - lumpectomy  . CAD (coronary artery disease)   . DCIS (ductal carcinoma in situ)   . Empty sella syndrome ([place])   . Gout   . Hyperlipidemia   . Hypertension   . Skin cancer 2011    PAST SURGICAL HISTORY: Past Surgical History:  Procedure  Laterality Date  . ABDOMINAL HYSTERECTOMY    . BREAST BIOPSY Left 2015   positive  . BREAST LUMPECTOMY Left 09/23/13  . CHOLECYSTECTOMY    . GASTRIC FUNDOPLICATION    . left lumpectomy      FAMILY HISTORY: Reviewed and unchanged. No reported history of malignancy or chronic disease.     ADVANCED DIRECTIVES:    HEALTH MAINTENANCE: Social History  Substance Use Topics  . Smoking status: Never Smoker  . Smokeless tobacco: Never Used  . Alcohol use No     Colonoscopy:  PAP:  Bone density:  Lipid panel:  Allergies  Allergen Reactions  . Vasotec  [Enalapril] Cough    Current Outpatient Prescriptions  Medication Sig Dispense Refill  . allopurinol (ZYLOPRIM) 300 MG tablet Take 300 mg by mouth daily.     Marland Kitchen amLODipine (NORVASC) 2.5 MG tablet Take 2.5 mg by mouth daily.     Marland Kitchen amLODipine (NORVASC) 2.5 MG tablet Take 2.5 mg by mouth 2 (two) times daily.    Marland Kitchen aspirin EC 81 MG tablet Take 81 mg by mouth daily.     Marland Kitchen atorvastatin (LIPITOR) 20 MG tablet Take 20 mg by mouth daily at 6 PM.     . Calcium Carbonate-Vitamin D 600-400 MG-UNIT per tablet Take 1 tablet by mouth 3 (three) times a week.     . losartan (COZAAR) 100 MG tablet Take 100 mg by mouth daily.     . metoprolol succinate (TOPROL-XL) 50 MG 24 hr tablet Take 50 mg by mouth daily.     . tamoxifen (NOLVADEX) 20 MG tablet TAKE 1  TABLET ONE TIME DAILY 90 tablet 1  . hydrochlorothiazide (HYDRODIURIL) 25 MG tablet Take 25 mg by mouth daily. Reported on 07/16/2015    . meloxicam (MOBIC) 15 MG tablet TAKE 1 TABLET (15 MG TOTAL) BY MOUTH DAILY WITH BREAKFAST.  0   No current facility-administered medications for this visit.     OBJECTIVE: Vitals:   08/29/16 1058  BP: (!) 157/77  Pulse: (!) 55  Temp: 99 F (37.2 C)     Body mass index is 31.98 kg/m.    ECOG FS:0 - Asymptomatic  General: Well-developed, well-nourished, no acute distress. Eyes: anicteric sclera. Breasts: Bilateral breasts and axilla without lumps or  masses. Lungs: Clear to auscultation bilaterally. Heart: Regular rate and rhythm. No rubs, murmurs, or gallops. Abdomen: Soft, nontender, nondistended. No organomegaly noted, normoactive bowel sounds. Musculoskeletal: No edema, cyanosis, or clubbing. Neuro: Alert, answering all questions appropriately. Cranial nerves grossly intact. Skin: No rashes or petechiae noted. Psych: Normal affect.   LAB RESULTS:  Lab Results  Component Value Date   NA 139 07/12/2015   K 3.6 07/12/2015   CL 104 07/12/2015   CO2 27 07/12/2015   GLUCOSE 92 07/12/2015   BUN 15 07/12/2015   CREATININE 0.89 07/12/2015   CALCIUM 9.8 07/12/2015   GFRNONAA >60 07/12/2015   GFRAA >60 07/12/2015    Lab Results  Component Value Date   WBC 6.7 07/12/2015   NEUTROABS 3.9 11/27/2013   HGB 13.2 07/12/2015   HCT 39.0 07/12/2015   MCV 96.6 07/12/2015   PLT 241 07/12/2015     STUDIES: Mm Diag Breast Tomo Bilateral  Result Date: 08/22/2016 CLINICAL DATA:  History of treated left breast cancer, status post lumpectomy and radiation therapy 2015.Patient is currently asymptomatic. EXAM: 2D DIGITAL DIAGNOSTIC BILATERAL MAMMOGRAM WITH CAD AND ADJUNCT TOMO COMPARISON:  Previous exam(s). ACR Breast Density Category c: The breast tissue is heterogeneously dense, which may obscure small masses. FINDINGS: Mammographically, there are no suspicious masses, areas of nonsurgical architectural distortion or clustered microcalcifications in either breast. There are stable post treatment and postsurgical changes in the left breast. Mammographic images were processed with CAD. IMPRESSION: No mammographic evidence of malignancy, status post left lumpectomy. RECOMMENDATION: Diagnostic mammogram is suggested in 1 year. (Code:[id number]-B-01Y) I have discussed the findings and recommendations with the patient. Results were also provided in writing at the conclusion of the visit. If applicable, a reminder letter will be sent to the patient regarding  the next appointment. BI-RADS CATEGORY  2: Benign. Electronically Signed   By: Fidela Salisbury M.D.   On: 08/22/2016 10:59    ASSESSMENT: DCIS of left breast.  PLAN:    1. DCIS of left breast: No evidence of invasive component.  Patient completed XRT in May of 2015. Continue tamoxifen for 5 years completing in May of 2020. Patient's most recent mammogram of the left breast on August 22, 2016 revealed BI-RADS 2, repeat in January 2019.  Return to clinic in 6 months for routine evaluation.  2. Hypertension: Patient reports difficulty controlling her blood pressure which is being managed by her PCP. Continue current medications as directed. 3. Postmenopausal: Will get bone mineral density in the next several weeks.   Patient expressed understanding and was in agreement with this plan. She also understands that She can call clinic at any time with any questions, concerns, or complaints.   Lucendia Herrlich, NP  08/29/16 3:44 PM   Patient was seen and evaluated independently and I agree with the assessment and plan  as dictated above.  Lloyd Huger, MD 08/31/16 2:09 PM

## 2016-08-29 ENCOUNTER — Inpatient Hospital Stay: Payer: Medicare Other | Attending: Oncology | Admitting: Oncology

## 2016-08-29 VITALS — BP 157/77 | HR 55 | Temp 99.0°F | Wt 180.6 lb

## 2016-08-29 DIAGNOSIS — E785 Hyperlipidemia, unspecified: Secondary | ICD-10-CM | POA: Diagnosis not present

## 2016-08-29 DIAGNOSIS — R232 Flushing: Secondary | ICD-10-CM | POA: Insufficient documentation

## 2016-08-29 DIAGNOSIS — R05 Cough: Secondary | ICD-10-CM | POA: Diagnosis not present

## 2016-08-29 DIAGNOSIS — I2510 Atherosclerotic heart disease of native coronary artery without angina pectoris: Secondary | ICD-10-CM

## 2016-08-29 DIAGNOSIS — R350 Frequency of micturition: Secondary | ICD-10-CM

## 2016-08-29 DIAGNOSIS — Z85828 Personal history of other malignant neoplasm of skin: Secondary | ICD-10-CM

## 2016-08-29 DIAGNOSIS — Z7982 Long term (current) use of aspirin: Secondary | ICD-10-CM | POA: Insufficient documentation

## 2016-08-29 DIAGNOSIS — M109 Gout, unspecified: Secondary | ICD-10-CM

## 2016-08-29 DIAGNOSIS — I10 Essential (primary) hypertension: Secondary | ICD-10-CM | POA: Insufficient documentation

## 2016-08-29 DIAGNOSIS — Z170 Estrogen receptor positive status [ER+]: Secondary | ICD-10-CM

## 2016-08-29 DIAGNOSIS — Z780 Asymptomatic menopausal state: Secondary | ICD-10-CM | POA: Diagnosis not present

## 2016-08-29 DIAGNOSIS — R7303 Prediabetes: Secondary | ICD-10-CM | POA: Diagnosis not present

## 2016-08-29 DIAGNOSIS — Z79810 Long term (current) use of selective estrogen receptor modulators (SERMs): Secondary | ICD-10-CM | POA: Diagnosis not present

## 2016-08-29 DIAGNOSIS — Z79899 Other long term (current) drug therapy: Secondary | ICD-10-CM

## 2016-08-29 DIAGNOSIS — D0512 Intraductal carcinoma in situ of left breast: Secondary | ICD-10-CM | POA: Insufficient documentation

## 2016-08-29 DIAGNOSIS — Z923 Personal history of irradiation: Secondary | ICD-10-CM | POA: Diagnosis not present

## 2016-08-29 NOTE — Progress Notes (Signed)
Patient recently taken off of HCTZ. Added amlodipine 2.5 mg twice a day.  C/o hot flashes with tamoxifen.  Otherwise, no complaints.

## 2016-09-12 ENCOUNTER — Ambulatory Visit
Admission: RE | Admit: 2016-09-12 | Discharge: 2016-09-12 | Disposition: A | Discharge status: Home / Self Care | Payer: Medicare Other | Source: Ambulatory Visit | Attending: Oncology | Admitting: Oncology

## 2016-09-12 DIAGNOSIS — M85851 Other specified disorders of bone density and structure, right thigh: Secondary | ICD-10-CM | POA: Insufficient documentation

## 2016-09-12 DIAGNOSIS — M8588 Other specified disorders of bone density and structure, other site: Secondary | ICD-10-CM | POA: Insufficient documentation

## 2016-09-12 DIAGNOSIS — Z780 Asymptomatic menopausal state: Secondary | ICD-10-CM | POA: Diagnosis not present

## 2016-10-30 ENCOUNTER — Other Ambulatory Visit: Payer: Self-pay | Admitting: Oncology

## 2017-01-09 DIAGNOSIS — M8589 Other specified disorders of bone density and structure, multiple sites: Secondary | ICD-10-CM | POA: Insufficient documentation

## 2017-01-09 DIAGNOSIS — Z7185 Encounter for immunization safety counseling: Secondary | ICD-10-CM | POA: Insufficient documentation

## 2017-01-09 DIAGNOSIS — Z7189 Other specified counseling: Secondary | ICD-10-CM | POA: Insufficient documentation

## 2017-01-09 DIAGNOSIS — Z0000 Encounter for general adult medical examination without abnormal findings: Secondary | ICD-10-CM | POA: Insufficient documentation

## 2017-02-28 ENCOUNTER — Ambulatory Visit: Payer: Medicare Other | Admitting: Oncology

## 2017-03-06 NOTE — Progress Notes (Signed)
[place]  Telephone:(336) [phone] Fax:(336) [phone]  ID: Charlene Coleman OB: November 19, 1940  MR#: [id number]  SWF#:[id number]  Patient Care Team: Dion Body, MD as PCP - General (Family Medicine)  CHIEF COMPLAINT: DCIS of left breast.  INTERVAL HISTORY: Patient returns to clinic today for routine 6 month evaluation. She has intermittent hot flashes with tamoxifen, but otherwise is tolerating treatment well. She currently feels well and is asymptomatic. She has no neurologic complaints.  She denies any recent fevers or illnesses.  She has a good appetite and denies weight loss. She denies any chest pain or shortness of breath.  She denies any nausea, vomiting, constipation, or diarrhea.  She reports urinary frequency, no urinary complaints.  Patient offers no further specific complaints today.   REVIEW OF SYSTEMS:   Review of Systems  Constitutional: Negative.  Negative for fever, malaise/fatigue and weight loss.  Respiratory: Negative.  Negative for cough and shortness of breath.   Cardiovascular: Negative.  Negative for chest pain.  Gastrointestinal: Negative.  Negative for abdominal pain.  Genitourinary: Negative for frequency and hematuria.  Musculoskeletal: Negative.  Negative for falls.  Skin: Negative.  Negative for rash.  Neurological: Positive for sensory change. Negative for weakness.  Psychiatric/Behavioral: Negative.  The patient is not nervous/anxious.     As per HPI. Otherwise, a complete review of systems is negative.  PAST MEDICAL HISTORY: Past Medical History:  Diagnosis Date  . Borderline diabetes   . Breast cancer ([place]) 2015   Left side - radiation - lumpectomy  . CAD (coronary artery disease)   . DCIS (ductal carcinoma in situ)   . Empty sella syndrome ([place])   . Gout   . Hyperlipidemia   . Hypertension   . Skin cancer 2011    PAST SURGICAL HISTORY: Past Surgical History:  Procedure Laterality Date  . ABDOMINAL HYSTERECTOMY      . BREAST BIOPSY Left 2015   positive  . BREAST LUMPECTOMY Left 09/23/13  . CHOLECYSTECTOMY    . GASTRIC FUNDOPLICATION    . left lumpectomy      FAMILY HISTORY: Reviewed and unchanged. No reported history of malignancy or chronic disease.     ADVANCED DIRECTIVES:    HEALTH MAINTENANCE: Social History  Substance Use Topics  . Smoking status: Never Smoker  . Smokeless tobacco: Never Used  . Alcohol use No     Colonoscopy:  PAP:  Bone density:  Lipid panel:  Allergies  Allergen Reactions  . Vasotec  [Enalapril] Cough    Current Outpatient Prescriptions  Medication Sig Dispense Refill  . allopurinol (ZYLOPRIM) 300 MG tablet Take 300 mg by mouth daily.     Marland Kitchen amLODipine (NORVASC) 2.5 MG tablet Take 2.5 mg by mouth daily.     Marland Kitchen amLODipine (NORVASC) 2.5 MG tablet Take 2.5 mg by mouth 2 (two) times daily.    Marland Kitchen aspirin EC 81 MG tablet Take 81 mg by mouth daily.     Marland Kitchen atorvastatin (LIPITOR) 20 MG tablet Take 20 mg by mouth daily at 6 PM.     . Calcium Carbonate-Vitamin D 600-400 MG-UNIT per tablet Take 1 tablet by mouth 3 (three) times a week.     . hydrochlorothiazide (HYDRODIURIL) 25 MG tablet Take 25 mg by mouth daily. Reported on 07/16/2015    . losartan (COZAAR) 100 MG tablet Take 100 mg by mouth daily.     . meloxicam (MOBIC) 15 MG tablet TAKE 1 TABLET (15 MG TOTAL) BY MOUTH DAILY WITH BREAKFAST.  0  . metoprolol succinate (TOPROL-XL) 50 MG 24 hr tablet Take 50 mg by mouth daily.     . naproxen sodium (ANAPROX) 220 MG tablet Take by mouth.    . tamoxifen (NOLVADEX) 20 MG tablet TAKE 1 TABLET EVERY DAY 90 tablet 1   No current facility-administered medications for this visit.     OBJECTIVE: Vitals:   03/07/17 1409  BP: (!) 150/86  Pulse: (!) 58  Temp: (!) 97 F (36.1 C)     Body mass index is 31.35 kg/m.    ECOG FS:0 - Asymptomatic  General: Well-developed, well-nourished, no acute distress. Eyes: anicteric sclera. Breasts: Bilateral breasts and axilla  without lumps or masses. Lungs: Clear to auscultation bilaterally. Heart: Regular rate and rhythm. No rubs, murmurs, or gallops. Abdomen: Soft, nontender, nondistended. No organomegaly noted, normoactive bowel sounds. Musculoskeletal: No edema, cyanosis, or clubbing. Neuro: Alert, answering all questions appropriately. Cranial nerves grossly intact. Skin: No rashes or petechiae noted. Psych: Normal affect.   LAB RESULTS:  Lab Results  Component Value Date   NA 139 07/12/2015   K 3.6 07/12/2015   CL 104 07/12/2015   CO2 27 07/12/2015   GLUCOSE 92 07/12/2015   BUN 15 07/12/2015   CREATININE 0.89 07/12/2015   CALCIUM 9.8 07/12/2015   GFRNONAA >60 07/12/2015   GFRAA >60 07/12/2015    Lab Results  Component Value Date   WBC 6.7 07/12/2015   NEUTROABS 3.9 11/27/2013   HGB 13.2 07/12/2015   HCT 39.0 07/12/2015   MCV 96.6 07/12/2015   PLT 241 07/12/2015     STUDIES: No results found.  ASSESSMENT: DCIS of left breast.  PLAN:    1. DCIS of left breast: No evidence of invasive component.  Patient completed XRT in May of 2015. Continue tamoxifen for 5 years completing in May of 2020. Patient's most recent mammogram of the left breast on August 22, 2016 revealed BI-RADS 2, repeat in January 2019.  Return to clinic in 6 months for routine evaluation.  2. Hypertension: Patient's blood pressure is elevated today. Continue monitoring treatment per PCP. Patient reports difficulty controlling her blood pressure which is being managed by her PCP. Continue current medications as directed. 3. Osteopenia: Patient had a bone mineral density on September 12, 2016 reported a T score of -1.3. Recommend calcium and vitamin D supplementation.   Patient expressed understanding and was in agreement with this plan. She also understands that She can call clinic at any time with any questions, concerns, or complaints.    Lloyd Huger, MD 03/10/17 3:40 PM

## 2017-03-07 ENCOUNTER — Inpatient Hospital Stay: Payer: Medicare Other | Attending: Oncology | Admitting: Oncology

## 2017-03-07 ENCOUNTER — Encounter: Payer: Self-pay | Admitting: Oncology

## 2017-03-07 ENCOUNTER — Encounter: Payer: Self-pay | Admitting: Nurse Practitioner

## 2017-03-07 VITALS — BP 150/86 | HR 58 | Temp 97.0°F | Wt 177.0 lb

## 2017-03-07 DIAGNOSIS — R7303 Prediabetes: Secondary | ICD-10-CM | POA: Insufficient documentation

## 2017-03-07 DIAGNOSIS — M659 Unspecified synovitis and tenosynovitis, unspecified site: Secondary | ICD-10-CM | POA: Insufficient documentation

## 2017-03-07 DIAGNOSIS — R232 Flushing: Secondary | ICD-10-CM | POA: Diagnosis not present

## 2017-03-07 DIAGNOSIS — M8580 Other specified disorders of bone density and structure, unspecified site: Secondary | ICD-10-CM | POA: Insufficient documentation

## 2017-03-07 DIAGNOSIS — M2010 Hallux valgus (acquired), unspecified foot: Secondary | ICD-10-CM | POA: Insufficient documentation

## 2017-03-07 DIAGNOSIS — D0512 Intraductal carcinoma in situ of left breast: Secondary | ICD-10-CM

## 2017-03-07 DIAGNOSIS — E6609 Other obesity due to excess calories: Secondary | ICD-10-CM | POA: Insufficient documentation

## 2017-03-07 DIAGNOSIS — Z923 Personal history of irradiation: Secondary | ICD-10-CM | POA: Insufficient documentation

## 2017-03-07 DIAGNOSIS — I10 Essential (primary) hypertension: Secondary | ICD-10-CM | POA: Insufficient documentation

## 2017-03-07 DIAGNOSIS — Z170 Estrogen receptor positive status [ER+]: Secondary | ICD-10-CM | POA: Diagnosis not present

## 2017-03-07 DIAGNOSIS — Z79899 Other long term (current) drug therapy: Secondary | ICD-10-CM

## 2017-03-07 DIAGNOSIS — Z8739 Personal history of other diseases of the musculoskeletal system and connective tissue: Secondary | ICD-10-CM | POA: Insufficient documentation

## 2017-03-07 DIAGNOSIS — M2040 Other hammer toe(s) (acquired), unspecified foot: Secondary | ICD-10-CM | POA: Insufficient documentation

## 2017-03-07 DIAGNOSIS — E236 Other disorders of pituitary gland: Secondary | ICD-10-CM | POA: Insufficient documentation

## 2017-03-07 DIAGNOSIS — Z7982 Long term (current) use of aspirin: Secondary | ICD-10-CM | POA: Diagnosis not present

## 2017-03-07 DIAGNOSIS — Z85828 Personal history of other malignant neoplasm of skin: Secondary | ICD-10-CM | POA: Insufficient documentation

## 2017-03-07 DIAGNOSIS — I2510 Atherosclerotic heart disease of native coronary artery without angina pectoris: Secondary | ICD-10-CM | POA: Insufficient documentation

## 2017-03-07 DIAGNOSIS — E782 Mixed hyperlipidemia: Secondary | ICD-10-CM | POA: Insufficient documentation

## 2017-03-07 DIAGNOSIS — E785 Hyperlipidemia, unspecified: Secondary | ICD-10-CM | POA: Diagnosis not present

## 2017-03-07 DIAGNOSIS — M109 Gout, unspecified: Secondary | ICD-10-CM | POA: Diagnosis not present

## 2017-03-07 DIAGNOSIS — Z79810 Long term (current) use of selective estrogen receptor modulators (SERMs): Secondary | ICD-10-CM | POA: Insufficient documentation

## 2017-04-09 ENCOUNTER — Other Ambulatory Visit: Payer: Self-pay | Admitting: Oncology

## 2017-07-03 ENCOUNTER — Other Ambulatory Visit: Payer: Self-pay

## 2017-07-03 ENCOUNTER — Ambulatory Visit
Admission: RE | Admit: 2017-07-03 | Discharge: 2017-07-03 | Disposition: A | Discharge status: Home / Self Care | Payer: Medicare Other | Source: Ambulatory Visit | Attending: Radiation Oncology | Admitting: Radiation Oncology

## 2017-07-03 VITALS — BP 169/81 | HR 63 | Temp 95.9°F | Wt 178.0 lb

## 2017-07-03 DIAGNOSIS — D0512 Intraductal carcinoma in situ of left breast: Secondary | ICD-10-CM

## 2017-07-03 DIAGNOSIS — Z923 Personal history of irradiation: Secondary | ICD-10-CM | POA: Insufficient documentation

## 2017-07-03 DIAGNOSIS — Z79810 Long term (current) use of selective estrogen receptor modulators (SERMs): Secondary | ICD-10-CM | POA: Diagnosis not present

## 2017-07-03 DIAGNOSIS — R232 Flushing: Secondary | ICD-10-CM | POA: Insufficient documentation

## 2017-07-03 DIAGNOSIS — Z170 Estrogen receptor positive status [ER+]: Secondary | ICD-10-CM | POA: Insufficient documentation

## 2017-07-03 NOTE — Progress Notes (Signed)
Radiation Oncology Follow up Note  Name: Charlene Coleman   Date:   07/03/2017 MRN:  [id number] DOB: 1940-10-11    This 76 y.o. female presents to the clinic today for a 3.5 year follow-up for whole breast radiation to her left breast for ductal carcinoma in situ.  REFERRING PROVIDER: Dion Body, MD  HPI: Patient is a 76 year old female now seen out 3.5 years having completed whole breast radiation to her left breast for ductal carcinoma in situ ER/PR positive. She seen today in routine follow-up and is doing well. She's currently on tamoxifen tolerating that well without side effect.. Last mammogram was last January which I have reviewed was BI-RADS 1 benign she has another one scheduled for this January. She specifically denies breast tenderness cough or bone pain.  COMPLICATIONS OF TREATMENT: none  FOLLOW UP COMPLIANCE: keeps appointments   PHYSICAL EXAM:  BP (!) 169/81   Pulse 63   Temp (!) 95.9 F (35.5 C)   Wt 178 lb 0.3 oz (80.7 kg)   BMI 31.54 kg/m  Lungs are clear to A&P cardiac examination essentially unremarkable with regular rate and rhythm. No dominant mass or nodularity is noted in either breast in 2 positions examined. Incision is well-healed. No axillary or supraclavicular adenopathy is appreciated. Cosmetic result is excellent. Well-developed well-nourished patient in NAD. HEENT reveals PERLA, EOMI, discs not visualized.  Oral cavity is clear. No oral mucosal lesions are identified. Neck is clear without evidence of cervical or supraclavicular adenopathy. Lungs are clear to A&P. Cardiac examination is essentially unremarkable with regular rate and rhythm without murmur rub or thrill. Abdomen is benign with no organomegaly or masses noted. Motor sensory and DTR levels are equal and symmetric in the upper and lower extremities. Cranial nerves II through XII are grossly intact. Proprioception is intact. No peripheral adenopathy or edema is identified. No motor or sensory  levels are noted. Crude visual fields are within normal range.  RADIOLOGY RESULTS: Mammograms are reviewed and compatible with the above-stated findings  PLAN: At the present time patient is doing well with no evidence of disease. I'm please were overall progress. I've asked to see her back in 1 year for follow-up. Follow-up mammograms already been ordered. She continues on tamoxifen without side effect except for some occasional hot flashes. I have recommended vitamin E supplements for that. Patient knows to call with any concerns.  I would like to take this opportunity to thank you for allowing me to participate in the care of your patient.Armstead Peaks., MD

## 2017-08-23 ENCOUNTER — Ambulatory Visit
Admission: RE | Admit: 2017-08-23 | Discharge: 2017-08-23 | Disposition: A | Discharge status: Home / Self Care | Payer: Medicare Other | Source: Ambulatory Visit | Attending: Oncology | Admitting: Oncology

## 2017-08-23 DIAGNOSIS — D0512 Intraductal carcinoma in situ of left breast: Secondary | ICD-10-CM | POA: Diagnosis present

## 2017-08-23 DIAGNOSIS — Z853 Personal history of malignant neoplasm of breast: Secondary | ICD-10-CM | POA: Insufficient documentation

## 2017-08-23 DIAGNOSIS — Z08 Encounter for follow-up examination after completed treatment for malignant neoplasm: Secondary | ICD-10-CM | POA: Insufficient documentation

## 2017-08-23 DIAGNOSIS — Z9889 Other specified postprocedural states: Secondary | ICD-10-CM | POA: Insufficient documentation

## 2017-08-23 HISTORY — DX: Personal history of irradiation: Z92.3

## 2017-09-07 ENCOUNTER — Ambulatory Visit: Payer: Medicare Other | Admitting: Oncology

## 2017-09-08 NOTE — Progress Notes (Signed)
[place]  Telephone:(336) [phone] Fax:(336) [phone]  ID: NAREH MATZKE OB: [birth year]  MR#: [id number]  JOA#:[id number]  Patient Care Team: Dion Body, MD as PCP - General (Family Medicine)  CHIEF COMPLAINT: DCIS of left breast.  INTERVAL HISTORY: Patient returns to clinic today for routine 6 month evaluation. She continues to have intermittent hot flashes with tamoxifen, but otherwise is tolerating treatment well. She currently feels well and is asymptomatic. She has no neurologic complaints.  She denies any recent fevers or illnesses. She has a good appetite and denies weight loss. She denies any chest pain or shortness of breath.  She denies any nausea, vomiting, constipation, or diarrhea.  She has no urinary complaints.  Patient offers no further specific complaints today.   REVIEW OF SYSTEMS:   Review of Systems  Constitutional: Negative.  Negative for fever, malaise/fatigue and weight loss.  Respiratory: Negative.  Negative for cough and shortness of breath.   Cardiovascular: Negative.  Negative for chest pain and leg swelling.  Gastrointestinal: Negative.  Negative for abdominal pain.  Genitourinary: Negative.  Negative for frequency and hematuria.  Musculoskeletal: Negative.  Negative for falls.  Skin: Negative.  Negative for rash.  Neurological: Positive for sensory change. Negative for weakness.  Psychiatric/Behavioral: Negative.  The patient is not nervous/anxious.     As per HPI. Otherwise, a complete review of systems is negative.  PAST MEDICAL HISTORY: Past Medical History:  Diagnosis Date  . Borderline diabetes   . Breast cancer ([place]) 2015   Left side - radiation - lumpectomy  . CAD (coronary artery disease)   . DCIS (ductal carcinoma in situ)   . Empty sella syndrome ([place])   . Gout   . Hyperlipidemia   . Hypertension   . Personal history of radiation therapy   . Skin cancer 2011    PAST SURGICAL HISTORY: Past Surgical  History:  Procedure Laterality Date  . ABDOMINAL HYSTERECTOMY    . BREAST BIOPSY Left 2015   positive  . BREAST LUMPECTOMY Left 09/23/13  . CHOLECYSTECTOMY    . GASTRIC FUNDOPLICATION    . left lumpectomy      FAMILY HISTORY: Reviewed and unchanged. No reported history of malignancy or chronic disease.     ADVANCED DIRECTIVES:    HEALTH MAINTENANCE: Social History   Tobacco Use  . Smoking status: Never Smoker  . Smokeless tobacco: Never Used  Substance Use Topics  . Alcohol use: No  . Drug use: No     Colonoscopy:  PAP:  Bone density:  Lipid panel:  Allergies  Allergen Reactions  . Vasotec  [Enalapril] Cough    Current Outpatient Medications  Medication Sig Dispense Refill  . allopurinol (ZYLOPRIM) 300 MG tablet Take 300 mg by mouth daily.     Marland Kitchen amLODipine (NORVASC) 5 MG tablet Take 5 mg by mouth daily.     Marland Kitchen aspirin EC 81 MG tablet Take 81 mg by mouth daily.     Marland Kitchen atorvastatin (LIPITOR) 20 MG tablet Take 20 mg by mouth daily at 6 PM.     . Calcium Carbonate-Vitamin D 600-400 MG-UNIT per tablet Take 1 tablet by mouth 3 (three) times a week.     . hydrochlorothiazide (HYDRODIURIL) 25 MG tablet Take 25 mg by mouth daily. Reported on 07/16/2015    . losartan (COZAAR) 100 MG tablet Take 100 mg by mouth daily.     . meloxicam (MOBIC) 15 MG tablet TAKE 1 TABLET (15 MG TOTAL) BY MOUTH DAILY WITH  BREAKFAST.  0  . metoprolol succinate (TOPROL-XL) 50 MG 24 hr tablet Take 50 mg by mouth daily.     . naproxen sodium (ANAPROX) 220 MG tablet Take by mouth.    . tamoxifen (NOLVADEX) 20 MG tablet TAKE 1 TABLET EVERY DAY 90 tablet 1   No current facility-administered medications for this visit.     OBJECTIVE: Vitals:   09/14/17 1513  BP: (!) 157/84  Pulse: (!) 59  Resp: 20  Temp: (!) 97.5 F (36.4 C)     Body mass index is 31.6 kg/m.    ECOG FS:0 - Asymptomatic  General: Well-developed, well-nourished, no acute distress. Eyes: anicteric sclera. Breasts: Bilateral  breasts and axilla without lumps or masses. Lungs: Clear to auscultation bilaterally. Heart: Regular rate and rhythm. No rubs, murmurs, or gallops. Abdomen: Soft, nontender, nondistended. No organomegaly noted, normoactive bowel sounds. Musculoskeletal: No edema, cyanosis, or clubbing. Neuro: Alert, answering all questions appropriately. Cranial nerves grossly intact. Skin: No rashes or petechiae noted. Psych: Normal affect.   LAB RESULTS:  Lab Results  Component Value Date   NA 139 07/12/2015   K 3.6 07/12/2015   CL 104 07/12/2015   CO2 27 07/12/2015   GLUCOSE 92 07/12/2015   BUN 15 07/12/2015   CREATININE 0.89 07/12/2015   CALCIUM 9.8 07/12/2015   GFRNONAA >60 07/12/2015   GFRAA >60 07/12/2015    Lab Results  Component Value Date   WBC 6.7 07/12/2015   NEUTROABS 3.9 11/27/2013   HGB 13.2 07/12/2015   HCT 39.0 07/12/2015   MCV 96.6 07/12/2015   PLT 241 07/12/2015     STUDIES: Mm Diag Breast Tomo Bilateral  Result Date: 08/23/2017 CLINICAL DATA:  History of left breast cancer status post lumpectomy in 2015. EXAM: 2D DIGITAL DIAGNOSTIC BILATERAL MAMMOGRAM WITH CAD AND ADJUNCT TOMO COMPARISON:  Previous exam(s). ACR Breast Density Category c: The breast tissue is heterogeneously dense, which may obscure small masses. FINDINGS: Stable lumpectomy changes are seen in the left breast. No suspicious mass or malignant type microcalcifications identified in either breast. Mammographic images were processed with CAD. IMPRESSION: No evidence of malignancy in either breast. RECOMMENDATION: Bilateral diagnostic mammogram in 1 year is recommended. I have discussed the findings and recommendations with the patient. Results were also provided in writing at the conclusion of the visit. If applicable, a reminder letter will be sent to the patient regarding the next appointment. BI-RADS CATEGORY  2: Benign. Electronically Signed   By: Lillia Mountain M.D.   On: 08/23/2017 11:13    ASSESSMENT:  DCIS of left breast.  PLAN:    1. DCIS of left breast: No evidence of invasive component.  Patient completed XRT in May of 2015. Continue tamoxifen for 5 years completing in May of 2020. Patient's most recent mammogram on August 23, 2017 was reported as BI-RADS 2, repeat in January 2020.  Return to clinic in 6 months for routine evaluation.  2. Hypertension: Patient's blood pressure is elevated today. Continue management and treatment per PCP.  3. Osteopenia: Patient had a bone mineral density on September 12, 2016 reported a T score of -1.3. Recommend calcium and vitamin D supplementation.  Repeat in February 2020.  Approximately 20 minutes was spent in discussion of which greater than 50% was consultation.  Patient expressed understanding and was in agreement with this plan. She also understands that She can call clinic at any time with any questions, concerns, or complaints.    Lloyd Huger, MD 09/17/17 7:58 AM

## 2017-09-11 ENCOUNTER — Other Ambulatory Visit: Payer: Self-pay | Admitting: Oncology

## 2017-09-14 ENCOUNTER — Encounter: Payer: Self-pay | Admitting: Oncology

## 2017-09-14 ENCOUNTER — Inpatient Hospital Stay: Payer: Medicare Other | Attending: Oncology | Admitting: Oncology

## 2017-09-14 VITALS — BP 157/84 | HR 59 | Temp 97.5°F | Resp 20 | Wt 178.4 lb

## 2017-09-14 DIAGNOSIS — D0512 Intraductal carcinoma in situ of left breast: Secondary | ICD-10-CM | POA: Diagnosis present

## 2017-09-14 DIAGNOSIS — M8580 Other specified disorders of bone density and structure, unspecified site: Secondary | ICD-10-CM | POA: Diagnosis not present

## 2017-09-14 DIAGNOSIS — I10 Essential (primary) hypertension: Secondary | ICD-10-CM | POA: Diagnosis not present

## 2017-09-14 DIAGNOSIS — Z79810 Long term (current) use of selective estrogen receptor modulators (SERMs): Secondary | ICD-10-CM | POA: Diagnosis not present

## 2017-09-14 NOTE — Progress Notes (Signed)
Patient denies any concerns today.  

## 2017-12-14 ENCOUNTER — Other Ambulatory Visit: Payer: Self-pay | Admitting: Oncology

## 2018-03-11 NOTE — Progress Notes (Signed)
Charlene Coleman  Telephone:(336) [phone] Fax:([phone]  ID: Charlene Coleman OB: 06-22-1941  MR#: [id number]  SEG#:[id number]  Patient Care Team: Dion Body, MD as PCP - General (Family Medicine)  CHIEF COMPLAINT: DCIS of left breast.  INTERVAL HISTORY: Patient returns to clinic today for routine six-month evaluation.  She continues to have hot flashes with tamoxifen, but otherwise is tolerating her treatments well.  She currently feels well and is asymptomatic. She has no neurologic complaints.  She denies any recent fevers or illnesses. She has a good appetite and denies weight loss. She denies any chest pain or shortness of breath.  She denies any nausea, vomiting, constipation, or diarrhea.  She has no urinary complaints.  Patient feels at her baseline offers no further specific complaints today.  REVIEW OF SYSTEMS:   Review of Systems  Constitutional: Negative.  Negative for fever, malaise/fatigue and weight loss.  Respiratory: Negative.  Negative for cough and shortness of breath.   Cardiovascular: Negative.  Negative for chest pain and leg swelling.  Gastrointestinal: Negative.  Negative for abdominal pain.  Genitourinary: Negative.  Negative for frequency and hematuria.  Musculoskeletal: Negative.  Negative for falls.  Skin: Negative.  Negative for rash.  Neurological: Positive for sensory change. Negative for weakness.  Psychiatric/Behavioral: Negative.  The patient is not nervous/anxious.     As per HPI. Otherwise, a complete review of systems is negative.  PAST MEDICAL HISTORY: Past Medical History:  Diagnosis Date  . Borderline diabetes   . Breast cancer ([place]) 2015   Left side - radiation - lumpectomy  . CAD (coronary artery disease)   . DCIS (ductal carcinoma in situ)   . Empty sella syndrome ([place])   . Gout   . Hyperlipidemia   . Hypertension   . Personal history of radiation therapy   . Skin cancer 2011    PAST SURGICAL HISTORY: Past  Surgical History:  Procedure Laterality Date  . ABDOMINAL HYSTERECTOMY    . BREAST BIOPSY Left 2015   positive  . BREAST LUMPECTOMY Left 09/23/13  . CHOLECYSTECTOMY    . GASTRIC FUNDOPLICATION    . left lumpectomy      FAMILY HISTORY: Reviewed and unchanged. No reported history of malignancy or chronic disease.     ADVANCED DIRECTIVES:    HEALTH MAINTENANCE: Social History   Tobacco Use  . Smoking status: Never Smoker  . Smokeless tobacco: Never Used  Substance Use Topics  . Alcohol use: No  . Drug use: No     Colonoscopy:  PAP:  Bone density:  Lipid panel:  Allergies  Allergen Reactions  . Vasotec  [Enalapril] Cough    Current Outpatient Medications  Medication Sig Dispense Refill  . allopurinol (ZYLOPRIM) 300 MG tablet Take 300 mg by mouth daily.     Marland Kitchen amLODipine (NORVASC) 5 MG tablet Take 5 mg by mouth daily.     Marland Kitchen aspirin EC 81 MG tablet Take 81 mg by mouth daily.     Marland Kitchen atorvastatin (LIPITOR) 20 MG tablet Take 20 mg by mouth daily at 6 PM.     . Calcium Carbonate-Vitamin D 600-400 MG-UNIT per tablet Take 1 tablet by mouth 3 (three) times a week.     . losartan (COZAAR) 100 MG tablet Take 100 mg by mouth daily.     . metoprolol succinate (TOPROL-XL) 50 MG 24 hr tablet Take 25 mg by mouth daily.     . naproxen sodium (ANAPROX) 220 MG tablet Take by mouth.    Marland Kitchen  tamoxifen (NOLVADEX) 20 MG tablet TAKE 1 TABLET EVERY DAY 90 tablet 1  . hydrochlorothiazide (HYDRODIURIL) 25 MG tablet Take 25 mg by mouth daily. Reported on 07/16/2015    . meloxicam (MOBIC) 15 MG tablet TAKE 1 TABLET (15 MG TOTAL) BY MOUTH DAILY WITH BREAKFAST.  0   No current facility-administered medications for this visit.     OBJECTIVE: Vitals:   03/14/18 1438  BP: (!) 171/83  Pulse: (!) 56  Resp: 20  Temp: 98.5 F (36.9 C)     Body mass index is 31.51 kg/m.    ECOG FS:0 - Asymptomatic  General: Well-developed, well-nourished, no acute distress. Eyes: Pink conjunctiva, anicteric  sclera. HEENT: Normocephalic, moist mucous membranes. Breast: Bilateral breast and axilla without lumps or masses. Lungs: Clear to auscultation bilaterally. Heart: Regular rate and rhythm. No rubs, murmurs, or gallops. Abdomen: Soft, nontender, nondistended. No organomegaly noted, normoactive bowel sounds. Musculoskeletal: No edema, cyanosis, or clubbing. Neuro: Alert, answering all questions appropriately. Cranial nerves grossly intact. Skin: No rashes or petechiae noted. Psych: Normal affect.   LAB RESULTS:  Lab Results  Component Value Date   NA 139 07/12/2015   K 3.6 07/12/2015   CL 104 07/12/2015   CO2 27 07/12/2015   GLUCOSE 92 07/12/2015   BUN 15 07/12/2015   CREATININE 0.89 07/12/2015   CALCIUM 9.8 07/12/2015   GFRNONAA >60 07/12/2015   GFRAA >60 07/12/2015    Lab Results  Component Value Date   WBC 6.7 07/12/2015   NEUTROABS 3.9 11/27/2013   HGB 13.2 07/12/2015   HCT 39.0 07/12/2015   MCV 96.6 07/12/2015   PLT 241 07/12/2015     STUDIES: No results found.  ASSESSMENT: DCIS of left breast.  PLAN:    1. DCIS of left breast: No evidence of invasive component.  Patient completed XRT in May of 2015. Continue tamoxifen for 5 years completing in May of 2020. Patient's most recent mammogram on August 23, 2017 was reported as BI-RADS 2, repeat in January 2020.  Return to clinic in 6 months for routine evaluation.  Patient will have completed her tamoxifen at this time and can consider discharging patient from clinic. 2. Hypertension: Patient's blood pressure remains persistently elevated.  Continue management and treatment per PCP.  3. Osteopenia: Patient had a bone mineral density on September 12, 2016 reported a T score of -1.3. Recommend calcium and vitamin D supplementation.  Continue monitoring per primary care.  Repeat in February 2020.  I spent a total of 20 minutes face-to-face with the patient of which greater than 50% of the visit was spent in counseling  and coordination of care as detailed above.   Patient expressed understanding and was in agreement with this plan. She also understands that She can call clinic at any time with any questions, concerns, or complaints.    Lloyd Huger, MD 03/18/18 8:14 AM

## 2018-03-14 ENCOUNTER — Encounter: Payer: Self-pay | Admitting: Oncology

## 2018-03-14 ENCOUNTER — Inpatient Hospital Stay: Payer: Medicare Other | Attending: Oncology | Admitting: Oncology

## 2018-03-14 VITALS — BP 171/83 | HR 56 | Temp 98.5°F | Resp 20 | Wt 177.9 lb

## 2018-03-14 DIAGNOSIS — N951 Menopausal and female climacteric states: Secondary | ICD-10-CM

## 2018-03-14 DIAGNOSIS — I10 Essential (primary) hypertension: Secondary | ICD-10-CM | POA: Insufficient documentation

## 2018-03-14 DIAGNOSIS — D0512 Intraductal carcinoma in situ of left breast: Secondary | ICD-10-CM

## 2018-03-14 DIAGNOSIS — M8580 Other specified disorders of bone density and structure, unspecified site: Secondary | ICD-10-CM | POA: Diagnosis not present

## 2018-03-14 DIAGNOSIS — Z79810 Long term (current) use of selective estrogen receptor modulators (SERMs): Secondary | ICD-10-CM

## 2018-03-14 DIAGNOSIS — Z920 Personal history of contraception: Secondary | ICD-10-CM | POA: Insufficient documentation

## 2018-03-14 NOTE — Progress Notes (Signed)
Patient denies any concerns today.  

## 2018-07-05 ENCOUNTER — Ambulatory Visit
Admission: RE | Admit: 2018-07-05 | Discharge: 2018-07-05 | Disposition: A | Discharge status: Home / Self Care | Payer: Medicare Other | Source: Ambulatory Visit | Attending: Radiation Oncology | Admitting: Radiation Oncology

## 2018-07-05 ENCOUNTER — Encounter: Payer: Self-pay | Admitting: Radiation Oncology

## 2018-07-05 ENCOUNTER — Other Ambulatory Visit: Payer: Self-pay | Admitting: *Deleted

## 2018-07-05 ENCOUNTER — Other Ambulatory Visit: Payer: Self-pay

## 2018-07-05 VITALS — BP 167/69 | HR 63 | Temp 97.5°F | Resp 18 | Wt 177.6 lb

## 2018-07-05 DIAGNOSIS — Z79810 Long term (current) use of selective estrogen receptor modulators (SERMs): Secondary | ICD-10-CM | POA: Diagnosis not present

## 2018-07-05 DIAGNOSIS — Z170 Estrogen receptor positive status [ER+]: Secondary | ICD-10-CM | POA: Insufficient documentation

## 2018-07-05 DIAGNOSIS — D0512 Intraductal carcinoma in situ of left breast: Secondary | ICD-10-CM | POA: Insufficient documentation

## 2018-07-05 DIAGNOSIS — Z923 Personal history of irradiation: Secondary | ICD-10-CM | POA: Insufficient documentation

## 2018-07-05 NOTE — Progress Notes (Signed)
Radiation Oncology Follow up Note  Name: Charlene Coleman   Date:   07/05/2018 MRN:  [id number] DOB: 03/28/1941    This 77 y.o. female presents to the clinic today for 4-1/2 year follow-up status post whole breast radiation to her left breast for ductal carcinoma in situ.  REFERRING PROVIDER: Dion Body, MD  HPI: patient is a 77 year old female now out 71-1/2 yearshaving completed whole breast radiation to her left breast for ductal carcinoma in situ ER/PR positive. Seen today in routine follow up she is doing well. She specifically denies breast tenderness cough or bone pain. She is currently on tamoxifen tolerating that well.her last mammogram which I have reviewed was back in January was BI-RADS 2 benign.  COMPLICATIONS OF TREATMENT: none  FOLLOW UP COMPLIANCE: keeps appointments   PHYSICAL EXAM:  BP (!) 167/69 (BP Location: Left Arm, Patient Position: Sitting)   Pulse 63   Temp (!) 97.5 F (36.4 C) (Tympanic)   Resp 18   Wt 177 lb 9.3 oz (80.6 kg)   BMI 31.46 kg/m  Lungs are clear to A&P cardiac examination essentially unremarkable with regular rate and rhythm. No dominant mass or nodularity is noted in either breast in 2 positions examined. Incision is well-healed. No axillary or supraclavicular adenopathy is appreciated. Cosmetic result is excellent.Well-developed well-nourished patient in NAD. HEENT reveals PERLA, EOMI, discs not visualized.  Oral cavity is clear. No oral mucosal lesions are identified. Neck is clear without evidence of cervical or supraclavicular adenopathy. Lungs are clear to A&P. Cardiac examination is essentially unremarkable with regular rate and rhythm without murmur rub or thrill. Abdomen is benign with no organomegaly or masses noted. Motor sensory and DTR levels are equal and symmetric in the upper and lower extremities. Cranial nerves II through XII are grossly intact. Proprioception is intact. No peripheral adenopathy or edema is identified. No motor or  sensory levels are noted. Crude visual fields are within normal range.  RADIOLOGY RESULTS: mammograms are reviewed and compatible above-stated findings  PLAN: the present time patient is doing well close to 5 years out with no evidence of disease. At this time I'm going to discontinue follow-up care. Patient is to call with any concerns at any time. She continues on tamoxifen. She is a rescheduled for follow-up mammograms in January.  I would like to take this opportunity to thank you for allowing me to participate in the care of your patient.Noreene Filbert, MD

## 2018-07-20 ENCOUNTER — Other Ambulatory Visit: Payer: Self-pay | Admitting: Student

## 2018-07-20 DIAGNOSIS — G8929 Other chronic pain: Secondary | ICD-10-CM

## 2018-07-20 DIAGNOSIS — M5442 Lumbago with sciatica, left side: Principal | ICD-10-CM

## 2018-08-05 ENCOUNTER — Ambulatory Visit
Admission: RE | Admit: 2018-08-05 | Discharge: 2018-08-05 | Disposition: A | Discharge status: Home / Self Care | Payer: Medicare Other | Source: Ambulatory Visit | Attending: Student | Admitting: Student

## 2018-08-05 DIAGNOSIS — M5442 Lumbago with sciatica, left side: Secondary | ICD-10-CM | POA: Diagnosis present

## 2018-08-05 DIAGNOSIS — G8929 Other chronic pain: Secondary | ICD-10-CM | POA: Diagnosis present

## 2018-08-24 ENCOUNTER — Ambulatory Visit
Admission: RE | Admit: 2018-08-24 | Discharge: 2018-08-24 | Disposition: A | Discharge status: Home / Self Care | Payer: Medicare Other | Source: Ambulatory Visit | Attending: Oncology | Admitting: Oncology

## 2018-08-24 DIAGNOSIS — D0512 Intraductal carcinoma in situ of left breast: Secondary | ICD-10-CM

## 2018-09-16 NOTE — Progress Notes (Deleted)
[place]  Telephone:(336) [phone] Fax:(336) [phone]  ID: Charlene Coleman OB: 29-Apr-1941  MR#: [id number]  AOZ#:[id number]  Patient Care Team: Dion Body, MD as PCP - General (Family Medicine)  CHIEF COMPLAINT: DCIS of left breast.  INTERVAL HISTORY: Patient returns to clinic today for routine six-month evaluation.  She continues to have hot flashes with tamoxifen, but otherwise is tolerating her treatments well.  She currently feels well and is asymptomatic. She has no neurologic complaints.  She denies any recent fevers or illnesses. She has a good appetite and denies weight loss. She denies any chest pain or shortness of breath.  She denies any nausea, vomiting, constipation, or diarrhea.  She has no urinary complaints.  Patient feels at her baseline offers no further specific complaints today.  REVIEW OF SYSTEMS:   Review of Systems  Constitutional: Negative.  Negative for fever, malaise/fatigue and weight loss.  Respiratory: Negative.  Negative for cough and shortness of breath.   Cardiovascular: Negative.  Negative for chest pain and leg swelling.  Gastrointestinal: Negative.  Negative for abdominal pain.  Genitourinary: Negative.  Negative for frequency and hematuria.  Musculoskeletal: Negative.  Negative for falls.  Skin: Negative.  Negative for rash.  Neurological: Positive for sensory change. Negative for weakness.  Psychiatric/Behavioral: Negative.  The patient is not nervous/anxious.     As per HPI. Otherwise, a complete review of systems is negative.  PAST MEDICAL HISTORY: Past Medical History:  Diagnosis Date  . Borderline diabetes   . Breast cancer ([place]) 2015   Left side - radiation - lumpectomy  . CAD (coronary artery disease)   . DCIS (ductal carcinoma in situ)   . Empty sella syndrome ([place])   . Gout   . Hyperlipidemia   . Hypertension   . Personal history of radiation therapy   . Skin cancer 2011    PAST SURGICAL HISTORY: Past  Surgical History:  Procedure Laterality Date  . ABDOMINAL HYSTERECTOMY    . BREAST BIOPSY Left 2015   positive  . BREAST LUMPECTOMY Left 09/23/13  . CHOLECYSTECTOMY    . GASTRIC FUNDOPLICATION    . left lumpectomy      FAMILY HISTORY: Reviewed and unchanged. No reported history of malignancy or chronic disease.     ADVANCED DIRECTIVES:    HEALTH MAINTENANCE: Social History   Tobacco Use  . Smoking status: Never Smoker  . Smokeless tobacco: Never Used  Substance Use Topics  . Alcohol use: No  . Drug use: No     Colonoscopy:  PAP:  Bone density:  Lipid panel:  Allergies  Allergen Reactions  . Vasotec  [Enalapril] Cough    Current Outpatient Medications  Medication Sig Dispense Refill  . allopurinol (ZYLOPRIM) 300 MG tablet Take 300 mg by mouth daily.     Marland Kitchen amLODipine (NORVASC) 5 MG tablet Take 5 mg by mouth daily.     Marland Kitchen aspirin EC 81 MG tablet Take 81 mg by mouth daily.     Marland Kitchen atorvastatin (LIPITOR) 20 MG tablet Take 20 mg by mouth daily at 6 PM.     . Calcium Carbonate-Vitamin D 600-400 MG-UNIT per tablet Take 1 tablet by mouth 3 (three) times a week.     . hydrochlorothiazide (HYDRODIURIL) 25 MG tablet Take 25 mg by mouth daily. Reported on 07/16/2015    . losartan (COZAAR) 100 MG tablet Take 100 mg by mouth daily.     . meloxicam (MOBIC) 15 MG tablet TAKE 1 TABLET (15 MG TOTAL)  BY MOUTH DAILY WITH BREAKFAST.  0  . metoprolol succinate (TOPROL-XL) 50 MG 24 hr tablet Take 25 mg by mouth daily.     . naproxen sodium (ANAPROX) 220 MG tablet Take by mouth.    . tamoxifen (NOLVADEX) 20 MG tablet TAKE 1 TABLET EVERY DAY 90 tablet 1   No current facility-administered medications for this visit.     OBJECTIVE: There were no vitals filed for this visit.   There is no height or weight on file to calculate BMI.    ECOG FS:0 - Asymptomatic  General: Well-developed, well-nourished, no acute distress. Eyes: Pink conjunctiva, anicteric sclera. HEENT: Normocephalic, moist  mucous membranes. Breast: Bilateral breast and axilla without lumps or masses. Lungs: Clear to auscultation bilaterally. Heart: Regular rate and rhythm. No rubs, murmurs, or gallops. Abdomen: Soft, nontender, nondistended. No organomegaly noted, normoactive bowel sounds. Musculoskeletal: No edema, cyanosis, or clubbing. Neuro: Alert, answering all questions appropriately. Cranial nerves grossly intact. Skin: No rashes or petechiae noted. Psych: Normal affect.   LAB RESULTS:  Lab Results  Component Value Date   NA 139 07/12/2015   K 3.6 07/12/2015   CL 104 07/12/2015   CO2 27 07/12/2015   GLUCOSE 92 07/12/2015   BUN 15 07/12/2015   CREATININE 0.89 07/12/2015   CALCIUM 9.8 07/12/2015   GFRNONAA >60 07/12/2015   GFRAA >60 07/12/2015    Lab Results  Component Value Date   WBC 6.7 07/12/2015   NEUTROABS 3.9 11/27/2013   HGB 13.2 07/12/2015   HCT 39.0 07/12/2015   MCV 96.6 07/12/2015   PLT 241 07/12/2015     STUDIES: Mm Diag Breast Tomo Bilateral  Result Date: 08/24/2018 CLINICAL DATA:  History of treated left breast cancer, status post lumpectomy in 2015. EXAM: DIGITAL DIAGNOSTIC BILATERAL MAMMOGRAM WITH CAD AND TOMO COMPARISON:  Previous exam(s). ACR Breast Density Category c: The breast tissue is heterogeneously dense, which may obscure small masses. FINDINGS: Mammographically, there are no suspicious masses, areas of nonsurgical architectural distortion or microcalcifications in either breast. Stable postsurgical changes in the left breast. Mammographic images were processed with CAD. IMPRESSION: No mammographic evidence of malignancy in either breast, status post left lumpectomy. RECOMMENDATION: Screening mammogram in one year.(Code:[id number]-B-01Y) I have discussed the findings and recommendations with the patient. Results were also provided in writing at the conclusion of the visit. If applicable, a reminder letter will be sent to the patient regarding the next appointment. BI-RADS  CATEGORY  2: Benign. Electronically Signed   By: Fidela Salisbury M.D.   On: 08/24/2018 11:00    ASSESSMENT: DCIS of left breast.  PLAN:    1. DCIS of left breast: No evidence of invasive component.  Patient completed XRT in May of 2015. Continue tamoxifen for 5 years completing in May of 2020. Patient's most recent mammogram on August 23, 2017 was reported as BI-RADS 2, repeat in January 2020.  Return to clinic in 6 months for routine evaluation.  Patient will have completed her tamoxifen at this time and can consider discharging patient from clinic. 2. Hypertension: Patient's blood pressure remains persistently elevated.  Continue management and treatment per PCP.  3. Osteopenia: Patient had a bone mineral density on September 12, 2016 reported a T score of -1.3. Recommend calcium and vitamin D supplementation.  Continue monitoring per primary care.  Repeat in February 2020.  I spent a total of 20 minutes face-to-face with the patient of which greater than 50% of the visit was spent in counseling and coordination of care  as detailed above.   Patient expressed understanding and was in agreement with this plan. She also understands that She can call clinic at any time with any questions, concerns, or complaints.    Lloyd Huger, MD 09/16/18 5:46 PM

## 2018-09-20 ENCOUNTER — Inpatient Hospital Stay: Payer: Medicare Other | Admitting: Oncology

## 2018-10-01 NOTE — Progress Notes (Signed)
[place]  Telephone:(336) [phone] Fax:(336) [phone]  ID: BONNEY BERRES OB: 21-Feb-1941  MR#: [id number]  CHE#:[id number]  Patient Care Team: Dion Body, MD as PCP - General (Family Medicine)  CHIEF COMPLAINT: DCIS of left breast.  INTERVAL HISTORY: Patient returns to clinic today for routine 86-month evaluation.  She continues to tolerate tamoxifen only with occasional hot flashes that do not affect her day-to-day activity.  She currently feels well and is asymptomatic. She has no neurologic complaints.  She denies any recent fevers or illnesses. She has a good appetite and denies weight loss. She denies any chest pain or shortness of breath.  She denies any nausea, vomiting, constipation, or diarrhea.  She has no urinary complaints.  Patient feels at her baseline offers no specific complaints today.  REVIEW OF SYSTEMS:   Review of Systems  Constitutional: Negative.  Negative for fever, malaise/fatigue and weight loss.  Respiratory: Negative.  Negative for cough and shortness of breath.   Cardiovascular: Negative.  Negative for chest pain and leg swelling.  Gastrointestinal: Negative.  Negative for abdominal pain.  Genitourinary: Negative.  Negative for frequency and hematuria.  Musculoskeletal: Negative.  Negative for falls.  Skin: Negative.  Negative for rash.  Neurological: Negative.  Negative for dizziness, sensory change, weakness and headaches.  Psychiatric/Behavioral: Negative.  The patient is not nervous/anxious.     As per HPI. Otherwise, a complete review of systems is negative.  PAST MEDICAL HISTORY: Past Medical History:  Diagnosis Date  . Borderline diabetes   . Breast cancer ([place]) 2015   Left side - radiation - lumpectomy  . CAD (coronary artery disease)   . DCIS (ductal carcinoma in situ)   . Empty sella syndrome ([place])   . Gout   . Hyperlipidemia   . Hypertension   . Personal history of radiation therapy   . Skin cancer 2011     PAST SURGICAL HISTORY: Past Surgical History:  Procedure Laterality Date  . ABDOMINAL HYSTERECTOMY    . BREAST BIOPSY Left 2015   positive  . BREAST LUMPECTOMY Left 09/23/13  . CHOLECYSTECTOMY    . GASTRIC FUNDOPLICATION    . left lumpectomy      FAMILY HISTORY: Reviewed and unchanged. No reported history of malignancy or chronic disease.     ADVANCED DIRECTIVES:    HEALTH MAINTENANCE: Social History   Tobacco Use  . Smoking status: Never Smoker  . Smokeless tobacco: Never Used  Substance Use Topics  . Alcohol use: No  . Drug use: No     Colonoscopy:  PAP:  Bone density:  Lipid panel:  Allergies  Allergen Reactions  . Vasotec  [Enalapril] Cough    Current Outpatient Medications  Medication Sig Dispense Refill  . allopurinol (ZYLOPRIM) 300 MG tablet Take 300 mg by mouth daily.     Marland Kitchen amLODipine (NORVASC) 10 MG tablet Take 1 tablet by mouth 1 day or 1 dose.    Marland Kitchen aspirin EC 81 MG tablet Take 81 mg by mouth daily.     Marland Kitchen atorvastatin (LIPITOR) 20 MG tablet Take 20 mg by mouth daily at 6 PM.     . Calcium Carbonate-Vitamin D 600-400 MG-UNIT per tablet Take 1 tablet by mouth 3 (three) times a week.     . hydrALAZINE (APRESOLINE) 25 MG tablet Take 1 tablet by mouth 1 day or 1 dose.    . losartan (COZAAR) 100 MG tablet Take 100 mg by mouth daily.     . metoprolol succinate (TOPROL-XL)  50 MG 24 hr tablet Take 25 mg by mouth daily.     . naproxen sodium (ANAPROX) 220 MG tablet Take by mouth.    . tamoxifen (NOLVADEX) 20 MG tablet TAKE 1 TABLET EVERY DAY 90 tablet 1   No current facility-administered medications for this visit.     OBJECTIVE: Vitals:   10/03/18 1038  BP: 127/82  Pulse: 78  Temp: 97.6 F (36.4 C)     Body mass index is 34.96 kg/m.    ECOG FS:0 - Asymptomatic  General: Well-developed, well-nourished, no acute distress. Eyes: Pink conjunctiva, anicteric sclera. HEENT: Normocephalic, moist mucous membranes. Breast: Patient reports a recent  normal exam by another provider. Lungs: Clear to auscultation bilaterally. Heart: Regular rate and rhythm. No rubs, murmurs, or gallops. Abdomen: Soft, nontender, nondistended. No organomegaly noted, normoactive bowel sounds. Musculoskeletal: No edema, cyanosis, or clubbing. Neuro: Alert, answering all questions appropriately. Cranial nerves grossly intact. Skin: No rashes or petechiae noted. Psych: Normal affect.  LAB RESULTS:  Lab Results  Component Value Date   NA 139 07/12/2015   K 3.6 07/12/2015   CL 104 07/12/2015   CO2 27 07/12/2015   GLUCOSE 92 07/12/2015   BUN 15 07/12/2015   CREATININE 0.89 07/12/2015   CALCIUM 9.8 07/12/2015   GFRNONAA >60 07/12/2015   GFRAA >60 07/12/2015    Lab Results  Component Value Date   WBC 6.7 07/12/2015   NEUTROABS 3.9 11/27/2013   HGB 13.2 07/12/2015   HCT 39.0 07/12/2015   MCV 96.6 07/12/2015   PLT 241 07/12/2015     STUDIES: No results found.  ASSESSMENT: DCIS of left breast.  PLAN:    1. DCIS of left breast: No evidence of invasive component.  Patient completed XRT in May of 2015.  Patient has been instructed to continue her tamoxifen through May 2020 at which time she can discontinue treatment.  Her most recent mammogram on August 24, 2018 was reported as BI-RADS 2.  Repeat screening breast mammogram in January 2021.  No further follow-up is necessary in the cancer center.  Patient has been instructed to continue close follow-up with her primary care physician who now can order her yearly screening mammograms.  Please refer patient back if there are any questions or concerns.  2. Hypertension: Patient's blood pressure is within normal limits today. 3. Osteopenia: Patient's most recent bone mineral density on September 12, 2016 reported a T score of -1.3.  Continue calcium and vitamin D supplementation.  Continue monitoring per primary care.    Patient expressed understanding and was in agreement with this plan. She also  understands that She can call clinic at any time with any questions, concerns, or complaints.    Lloyd Huger, MD 10/04/18 12:22 PM

## 2018-10-03 ENCOUNTER — Inpatient Hospital Stay: Payer: Medicare Other | Attending: Oncology | Admitting: Oncology

## 2018-10-03 ENCOUNTER — Other Ambulatory Visit: Payer: Self-pay

## 2018-10-03 ENCOUNTER — Encounter: Payer: Self-pay | Admitting: Oncology

## 2018-10-03 VITALS — BP 127/82 | HR 78 | Temp 97.6°F | Ht 60.0 in | Wt 179.0 lb

## 2018-10-03 DIAGNOSIS — Z923 Personal history of irradiation: Secondary | ICD-10-CM | POA: Insufficient documentation

## 2018-10-03 DIAGNOSIS — Z90710 Acquired absence of both cervix and uterus: Secondary | ICD-10-CM | POA: Diagnosis not present

## 2018-10-03 DIAGNOSIS — Z79899 Other long term (current) drug therapy: Secondary | ICD-10-CM | POA: Insufficient documentation

## 2018-10-03 DIAGNOSIS — D0512 Intraductal carcinoma in situ of left breast: Secondary | ICD-10-CM | POA: Diagnosis present

## 2018-10-03 DIAGNOSIS — E785 Hyperlipidemia, unspecified: Secondary | ICD-10-CM | POA: Insufficient documentation

## 2018-10-03 DIAGNOSIS — Z7982 Long term (current) use of aspirin: Secondary | ICD-10-CM | POA: Diagnosis not present

## 2018-10-03 DIAGNOSIS — M8580 Other specified disorders of bone density and structure, unspecified site: Secondary | ICD-10-CM

## 2018-10-03 DIAGNOSIS — I10 Essential (primary) hypertension: Secondary | ICD-10-CM | POA: Diagnosis not present

## 2018-10-03 DIAGNOSIS — Z85828 Personal history of other malignant neoplasm of skin: Secondary | ICD-10-CM | POA: Insufficient documentation

## 2018-10-03 DIAGNOSIS — Z170 Estrogen receptor positive status [ER+]: Secondary | ICD-10-CM | POA: Insufficient documentation

## 2018-10-03 DIAGNOSIS — Z79810 Long term (current) use of selective estrogen receptor modulators (SERMs): Secondary | ICD-10-CM | POA: Diagnosis not present

## 2018-10-03 NOTE — Progress Notes (Signed)
Patient is here today to follow up on her DCIS of left breast. Patient denied nipple discharge, skin discoloration, knots/lumps or pain. Patient stated that she performs monthly self-breast exams. Patient's last mammogram was on 08/24/2018 and it was benign and Bone density on 09/12/2016.

## 2019-08-07 ENCOUNTER — Other Ambulatory Visit: Payer: Self-pay | Admitting: Family Medicine

## 2019-08-07 DIAGNOSIS — Z1231 Encounter for screening mammogram for malignant neoplasm of breast: Secondary | ICD-10-CM

## 2019-08-28 ENCOUNTER — Ambulatory Visit
Admission: RE | Admit: 2019-08-28 | Discharge: 2019-08-28 | Disposition: A | Discharge status: Home / Self Care | Payer: Medicare Other | Source: Ambulatory Visit | Attending: Family Medicine | Admitting: Family Medicine

## 2019-08-28 DIAGNOSIS — Z1231 Encounter for screening mammogram for malignant neoplasm of breast: Secondary | ICD-10-CM | POA: Diagnosis present

## 2020-01-25 ENCOUNTER — Encounter: Payer: Self-pay | Admitting: Radiology

## 2020-01-25 ENCOUNTER — Other Ambulatory Visit: Payer: Self-pay

## 2020-01-25 ENCOUNTER — Emergency Department: Payer: Medicare Other

## 2020-01-25 DIAGNOSIS — Z5321 Procedure and treatment not carried out due to patient leaving prior to being seen by health care provider: Secondary | ICD-10-CM | POA: Insufficient documentation

## 2020-01-25 DIAGNOSIS — R079 Chest pain, unspecified: Secondary | ICD-10-CM | POA: Diagnosis not present

## 2020-01-25 LAB — CBC
HCT: 40.1 % (ref 36.0–46.0)
Hemoglobin: 13.8 g/dL (ref 12.0–15.0)
MCH: 30.9 pg (ref 26.0–34.0)
MCHC: 34.4 g/dL (ref 30.0–36.0)
MCV: 89.7 fL (ref 80.0–100.0)
Platelets: 308 10*3/uL (ref 150–400)
RBC: 4.47 MIL/uL (ref 3.87–5.11)
RDW: 14.9 % (ref 11.5–15.5)
WBC: 14.8 10*3/uL — ABNORMAL HIGH (ref 4.0–10.5)
nRBC: 0 % (ref 0.0–0.2)

## 2020-01-25 LAB — BASIC METABOLIC PANEL
Anion gap: 8 (ref 5–15)
BUN: 27 mg/dL — ABNORMAL HIGH (ref 8–23)
CO2: 22 mmol/L (ref 22–32)
Calcium: 8.9 mg/dL (ref 8.9–10.3)
Chloride: 106 mmol/L (ref 98–111)
Creatinine, Ser: 0.85 mg/dL (ref 0.44–1.00)
GFR calc Af Amer: >60 mL/min (ref >60)
GFR calc non Af Amer: >60 mL/min (ref >60)
Glucose, Bld: 132 mg/dL — ABNORMAL HIGH (ref 70–99)
Potassium: 4 mmol/L (ref 3.5–5.1)
Sodium: 136 mmol/L (ref 135–145)

## 2020-01-25 LAB — TROPONIN I (HIGH SENSITIVITY): Troponin I (High Sensitivity): 11 ng/L (ref <18)

## 2020-01-25 NOTE — ED Triage Notes (Signed)
Reports chest pain for the past 2 hours.

## 2020-01-26 ENCOUNTER — Emergency Department
Admission: EM | Admit: 2020-01-26 | Discharge: 2020-01-26 | Disposition: A | Discharge status: Home / Self Care | Payer: Medicare Other | Attending: Emergency Medicine | Admitting: Emergency Medicine

## 2020-01-27 ENCOUNTER — Telehealth: Payer: Self-pay | Admitting: Emergency Medicine

## 2020-01-27 NOTE — Telephone Encounter (Signed)
Called patient due to lwot to inquire about condition and follow up plans. Left message.   

## 2020-02-06 ENCOUNTER — Encounter: Payer: Self-pay | Admitting: Emergency Medicine

## 2020-02-06 ENCOUNTER — Emergency Department: Payer: Medicare Other

## 2020-02-06 ENCOUNTER — Emergency Department
Admission: EM | Admit: 2020-02-06 | Discharge: 2020-02-06 | Disposition: A | Discharge status: Home / Self Care | Payer: Medicare Other | Attending: Emergency Medicine | Admitting: Emergency Medicine

## 2020-02-06 ENCOUNTER — Other Ambulatory Visit: Payer: Self-pay

## 2020-02-06 DIAGNOSIS — I2510 Atherosclerotic heart disease of native coronary artery without angina pectoris: Secondary | ICD-10-CM | POA: Insufficient documentation

## 2020-02-06 DIAGNOSIS — Z85828 Personal history of other malignant neoplasm of skin: Secondary | ICD-10-CM | POA: Insufficient documentation

## 2020-02-06 DIAGNOSIS — Z79899 Other long term (current) drug therapy: Secondary | ICD-10-CM | POA: Diagnosis not present

## 2020-02-06 DIAGNOSIS — M25461 Effusion, right knee: Secondary | ICD-10-CM | POA: Insufficient documentation

## 2020-02-06 DIAGNOSIS — Z7982 Long term (current) use of aspirin: Secondary | ICD-10-CM | POA: Diagnosis not present

## 2020-02-06 DIAGNOSIS — M25561 Pain in right knee: Secondary | ICD-10-CM | POA: Diagnosis present

## 2020-02-06 DIAGNOSIS — R2241 Localized swelling, mass and lump, right lower limb: Secondary | ICD-10-CM | POA: Diagnosis not present

## 2020-02-06 DIAGNOSIS — I10 Essential (primary) hypertension: Secondary | ICD-10-CM | POA: Insufficient documentation

## 2020-02-06 LAB — CBC WITH DIFFERENTIAL/PLATELET
Abs Immature Granulocytes: 0.04 10*3/uL (ref 0.00–0.07)
Basophils Absolute: 0.1 10*3/uL (ref 0.0–0.1)
Basophils Relative: 0 %
Eosinophils Absolute: 0.1 10*3/uL (ref 0.0–0.5)
Eosinophils Relative: 1 %
HCT: 39.9 % (ref 36.0–46.0)
Hemoglobin: 13.3 g/dL (ref 12.0–15.0)
Immature Granulocytes: 0 %
Lymphocytes Relative: 15 %
Lymphs Abs: 2.1 10*3/uL (ref 0.7–4.0)
MCH: 30.8 pg (ref 26.0–34.0)
MCHC: 33.3 g/dL (ref 30.0–36.0)
MCV: 92.4 fL (ref 80.0–100.0)
Monocytes Absolute: 1 10*3/uL (ref 0.1–1.0)
Monocytes Relative: 7 %
Neutro Abs: 11 10*3/uL — ABNORMAL HIGH (ref 1.7–7.7)
Neutrophils Relative %: 77 %
Platelets: 244 10*3/uL (ref 150–400)
RBC: 4.32 MIL/uL (ref 3.87–5.11)
RDW: 14.8 % (ref 11.5–15.5)
WBC: 14.3 10*3/uL — ABNORMAL HIGH (ref 4.0–10.5)
nRBC: 0 % (ref 0.0–0.2)

## 2020-02-06 LAB — BASIC METABOLIC PANEL
Anion gap: 12 (ref 5–15)
BUN: 19 mg/dL (ref 8–23)
CO2: 20 mmol/L — ABNORMAL LOW (ref 22–32)
Calcium: 9.3 mg/dL (ref 8.9–10.3)
Chloride: 107 mmol/L (ref 98–111)
Creatinine, Ser: 1.03 mg/dL — ABNORMAL HIGH (ref 0.44–1.00)
GFR calc Af Amer: >60 mL/min (ref >60)
GFR calc non Af Amer: 52 mL/min — ABNORMAL LOW (ref >60)
Glucose, Bld: 117 mg/dL — ABNORMAL HIGH (ref 70–99)
Potassium: 4.1 mmol/L (ref 3.5–5.1)
Sodium: 139 mmol/L (ref 135–145)

## 2020-02-06 LAB — PROTIME-INR
INR: 1.5 — ABNORMAL HIGH (ref 0.8–1.2)
Prothrombin Time: 17.1 seconds — ABNORMAL HIGH (ref 11.4–15.2)

## 2020-02-06 MED ORDER — LIDOCAINE HCL 2 % IJ SOLN
10.0000 mL | Freq: Once | INTRAMUSCULAR | Status: DC
  Filled 2020-02-06: qty 10

## 2020-02-06 MED ORDER — LIDOCAINE 5 % EX PTCH
1.0000 | MEDICATED_PATCH | CUTANEOUS | Status: DC
  Administered 2020-02-06: 1 via TRANSDERMAL
  Filled 2020-02-06: qty 1

## 2020-02-06 MED ORDER — OXYCODONE-ACETAMINOPHEN 5-325 MG PO TABS: 1.0000 | ORAL_TABLET | ORAL | 0 refills | Status: DC | PRN

## 2020-02-06 MED ORDER — LIDOCAINE HCL (PF) 1 % IJ SOLN
5.0000 mL | Freq: Once | INTRAMUSCULAR | Status: AC
  Administered 2020-02-06: 5 mL via INTRADERMAL
  Filled 2020-02-06: qty 5

## 2020-02-06 MED ORDER — LIDOCAINE 5 % EX PTCH
1.0000 | MEDICATED_PATCH | Freq: Two times a day (BID) | CUTANEOUS | 0 refills | Status: DC
Start: 2020-02-06 — End: 2020-04-03

## 2020-02-06 MED ORDER — OXYCODONE-ACETAMINOPHEN 5-325 MG PO TABS
1.0000 | ORAL_TABLET | Freq: Once | ORAL | Status: AC
  Administered 2020-02-06: 1 via ORAL
  Filled 2020-02-06: qty 1

## 2020-02-06 NOTE — ED Triage Notes (Signed)
Pt reports pain to right knee and swelling that started last pm. Pt denies injuries, reports took 1gm of tylenol this am. No redness noted. Swelling noted.

## 2020-02-06 NOTE — ED Provider Notes (Signed)
[facility] Emergency Department Provider Note   ____________________________________________   First MD Initiated Contact with Patient 02/06/20 1054     (approximate)  I have reviewed the triage vital signs and the nursing notes.   HISTORY  Chief Complaint Knee Pain    HPI Charlene Coleman is a 79 y.o. female with past medical history of CAD, hypertension, atrial fibrillation, breast cancer, hyperlipidemia, and gout who presents to the ED complaining of knee pain.  She denies any recent falls or other trauma to her knee, but overnight developed increasing swelling and pain to the right knee.  She is took some Tylenol this morning without significant relief.  She has had a difficult time moving her right leg with this increasing pain.  She denies any similar symptoms in the past, has had gout before but this has previously affected her ankles.  She does take Eliquis for atrial fibrillation.        Past Medical History:  Diagnosis Date  . Borderline diabetes   . Breast cancer ([place]) 2015   Left side - radiation - lumpectomy  . CAD (coronary artery disease)   . DCIS (ductal carcinoma in situ)   . Empty sella syndrome ([place])   . Gout   . Hyperlipidemia   . Hypertension   . Personal history of radiation therapy   . Skin cancer 2011    Patient Active Problem List   Diagnosis Date Noted  . CAD (coronary artery disease), native coronary artery 03/07/2017  . Empty sella syndrome ([place]) 03/07/2017  . Benign essential hypertension 03/07/2017  . Hallux valgus, acquired 03/07/2017  . H/O: gout 03/07/2017  . Mixed hyperlipidemia 03/07/2017  . Non morbid obesity due to excess calories 03/07/2017  . Other hammer toe (acquired) 03/07/2017  . Synovitis and tenosynovitis, unspecified 03/07/2017  . Medicare annual wellness visit, initial 01/09/2017  . Medicare annual wellness visit, subsequent 01/09/2017  . Osteopenia of multiple sites 01/09/2017  . Vaccine  counseling 01/09/2017  . Personal history of other malignant neoplasm of skin 11/11/2015  . Ductal carcinoma in situ (DCIS) of left breast 02/01/2015  . Dysphagia 07/16/2014  . FH: colon polyps 07/16/2014  . Cancer of breast ([place]) 03/14/2014  . GERD (gastroesophageal reflux disease) 03/14/2014  . H/O cardiac catheterization 03/14/2014    Past Surgical History:  Procedure Laterality Date  . ABDOMINAL HYSTERECTOMY    . BREAST BIOPSY Left 2015   positive  . BREAST LUMPECTOMY Left 09/23/13  . CHOLECYSTECTOMY    . GASTRIC FUNDOPLICATION    . left lumpectomy      Prior to Admission medications   Medication Sig Start Date End Date Taking? Authorizing Provider  allopurinol (ZYLOPRIM) 300 MG tablet Take 300 mg by mouth daily.  11/27/14   [provider]  amLODipine (NORVASC) 10 MG tablet Take 1 tablet by mouth 1 day or 1 dose. 07/12/18   [provider]  aspirin EC 81 MG tablet Take 81 mg by mouth daily.     [provider]  atorvastatin (LIPITOR) 20 MG tablet Take 20 mg by mouth daily at 6 PM.  12/05/14   [provider]  Calcium Carbonate-Vitamin D 600-400 MG-UNIT per tablet Take 1 tablet by mouth 3 (three) times a week.     [provider]  hydrALAZINE (APRESOLINE) 25 MG tablet Take 1 tablet by mouth 1 day or 1 dose. 08/31/18 08/31/19  [provider]  lidocaine (LIDODERM) 5 % Place 1 patch onto the skin  every 12 (twelve) hours. Remove & Discard patch within 12 hours or as directed by MD 02/06/20 02/05/21  Blake Divine, MD  losartan (COZAAR) 100 MG tablet Take 100 mg by mouth daily.  11/27/14   [provider]  metoprolol succinate (TOPROL-XL) 50 MG 24 hr tablet Take 25 mg by mouth daily.  10/22/14   [provider]  naproxen sodium (ANAPROX) 220 MG tablet Take by mouth.    [provider]  oxyCODONE-acetaminophen (PERCOCET) 5-325 MG tablet Take 1 tablet by mouth every 4 (four) hours as needed for severe pain. 02/06/20  02/05/21  Blake Divine, MD  tamoxifen (NOLVADEX) 20 MG tablet TAKE 1 TABLET EVERY DAY 12/14/17   Lloyd Huger, MD    Allergies Vasotec  [enalapril]  Family History  Problem Relation Age of Onset  . Breast cancer Cousin   . Breast cancer Maternal Aunt 5    Social History Social History   Tobacco Use  . Smoking status: Never Smoker  . Smokeless tobacco: Never Used  Vaping Use  . Vaping Use: Never used  Substance Use Topics  . Alcohol use: No  . Drug use: No    Review of Systems  Constitutional: No fever/chills Eyes: No visual changes. ENT: No sore throat. Cardiovascular: Denies chest pain. Respiratory: Denies shortness of breath. Gastrointestinal: No abdominal pain.  No nausea, no vomiting.  No diarrhea.  No constipation. Genitourinary: Negative for dysuria. Musculoskeletal: Negative for back pain.  Positive for right knee pain and swelling. Skin: Negative for rash. Neurological: Negative for headaches, focal weakness or numbness.  ____________________________________________   PHYSICAL EXAM:  VITAL SIGNS: ED Triage Vitals  Enc Vitals Group     BP 02/06/20 0748 (!) 197/96     Pulse Rate 02/06/20 0748 (!) 59     Resp 02/06/20 0748 18     Temp 02/06/20 0748 98 F (36.7 C)     Temp Source 02/06/20 0748 Oral     SpO2 02/06/20 0748 98 %     Weight 02/06/20 0745 200 lb (90.7 kg)     Height 02/06/20 0745 5' (1.524 m)     Head Circumference --      Peak Flow --      Pain Score 02/06/20 0744 10     Pain Loc --      Pain Edu? --      Excl. in [place]? --     Constitutional: Alert and oriented. Eyes: Conjunctivae are normal. Head: Atraumatic. Nose: No congestion/rhinnorhea. Mouth/Throat: Mucous membranes are moist. Neck: Normal ROM Cardiovascular: Normal rate, regular rhythm. Grossly normal heart sounds. Respiratory: Normal respiratory effort.  No retractions. Lungs CTAB. Gastrointestinal: Soft and nontender. No distention. Genitourinary:  deferred Musculoskeletal: Diffuse tenderness to right knee with associated effusion.  No tenderness to bilateral hips or ankles.  2+ DP pulses bilaterally. Neurologic:  Normal speech and language. No gross focal neurologic deficits are appreciated. Skin:  Skin is warm, dry and intact. No rash noted. Psychiatric: Mood and affect are normal. Speech and behavior are normal.  ____________________________________________   LABS (all labs ordered are listed, but only abnormal results are displayed)  Labs Reviewed  BASIC METABOLIC PANEL - Abnormal; Notable for the following components:      Result Value   CO2 20 (*)    Glucose, Bld 117 (*)    Creatinine, Ser 1.03 (*)    GFR calc non Af Amer 52 (*)    All other components within normal limits  CBC WITH DIFFERENTIAL/PLATELET -  Abnormal; Notable for the following components:   WBC 14.3 (*)    Neutro Abs 11.0 (*)    All other components within normal limits  PROTIME-INR - Abnormal; Notable for the following components:   Prothrombin Time 17.1 (*)    INR 1.5 (*)    All other components within normal limits     PROCEDURES  Procedure(s) performed (including Critical Care):  .Joint Aspiration/Arthrocentesis  Date/Time: 02/06/2020 1:06 PM Performed by: Blake Divine, MD Authorized by: Blake Divine, MD   Consent:    Consent obtained:  Verbal   Consent given by:  Patient   Risks discussed:  Bleeding, infection, pain and incomplete drainage   Alternatives discussed:  No treatment and referral Location:    Location:  Knee   Knee:  R knee Anesthesia (see MAR for exact dosages):    Anesthesia method:  Local infiltration   Local anesthetic:  Lidocaine 1% w/o epi Procedure details:    Preparation: Patient was prepped and draped in usual sterile fashion     Needle gauge:  18 G   Ultrasound guidance: no     Approach:  Lateral   Aspirate amount:  5   Aspirate characteristics:  Bloody   Steroid injected: no     Specimen collected:  no   Post-procedure details:    Dressing:  Adhesive bandage and gauze roll   Patient tolerance of procedure:  Tolerated well, no immediate complications     ____________________________________________   INITIAL IMPRESSION / ASSESSMENT AND PLAN / ED COURSE      79 year old female with past medical history of CAD, atrial fibrillation, hypertension, breast cancer, hyperlipidemia, and gout presents to the ED complaining of increasing knee pain and swelling since last night.  X-ray is negative for acute process and ultrasound shows no evidence of DVT.  There is no erythema or warmth, and patient is able to range her knee, I do not suspect a septic arthritis.  Lab work is reassuring, patient has a mild leukocytosis however this is similar to previous.  Gout as well as hemarthrosis are on the differential, arthrocentesis was attempted on her large effusion, however I was only able to drain a small amount of blood.  Hemarthrosis seems most likely with possibly some coagulation making drainage of effusion difficult.  Lidoderm patch as well as Ace wrap were applied, patient referred for follow-up with orthopedics.  She was counseled to return to the ED for new worsening symptoms, patient agrees with plan.      ____________________________________________   FINAL CLINICAL IMPRESSION(S) / ED DIAGNOSES  Final diagnoses:  Acute pain of right knee  Effusion of right knee     ED Discharge Orders         Ordered    oxyCODONE-acetaminophen (PERCOCET) 5-325 MG tablet  Every 4 hours PRN     Discontinue  Reprint     02/06/20 1238    lidocaine (LIDODERM) 5 %  Every 12 hours     Discontinue  Reprint     02/06/20 1238           Note:  This document was prepared using Dragon voice recognition software and may include unintentional dictation errors.   Blake Divine, MD 02/06/20 1321

## 2020-02-06 NOTE — ED Triage Notes (Signed)
Pt in via EMS from home with c/o right knee pain. Pt denies falling or injuries. Pt with swelling to right knee. Pt reports feels like right foot is going to sleep. Pt also with right hip pain and had an injection 2 weeks ago. 191/118, HR 60, 98%RA, 97.9temp, CBG 104

## 2020-03-02 ENCOUNTER — Encounter: Payer: Self-pay | Admitting: Cardiology

## 2020-03-02 ENCOUNTER — Ambulatory Visit (INDEPENDENT_AMBULATORY_CARE_PROVIDER_SITE_OTHER): Payer: Medicare Other | Admitting: Cardiology

## 2020-03-02 ENCOUNTER — Other Ambulatory Visit: Payer: Self-pay

## 2020-03-02 VITALS — BP 102/58 | HR 47 | Ht 60.0 in | Wt 165.0 lb

## 2020-03-02 DIAGNOSIS — R0602 Shortness of breath: Secondary | ICD-10-CM

## 2020-03-02 DIAGNOSIS — I480 Paroxysmal atrial fibrillation: Secondary | ICD-10-CM | POA: Diagnosis not present

## 2020-03-02 DIAGNOSIS — I2510 Atherosclerotic heart disease of native coronary artery without angina pectoris: Secondary | ICD-10-CM | POA: Diagnosis not present

## 2020-03-02 DIAGNOSIS — I10 Essential (primary) hypertension: Secondary | ICD-10-CM

## 2020-03-02 NOTE — Patient Instructions (Signed)
Medication Instructions:   Your physician has recommended you make the following change in your medication:   1.  STOP taking your hydrALAZINE (APRESOLINE) 25 MG tablet 2.  STOP taking your metoprolol succinate (TOPROL-XL) 50 MG 24 hr tablet  *If you need a refill on your cardiac medications before your next appointment, please call your pharmacy*   Lab Work: None Ordered If you have labs (blood work) drawn today and your tests are completely normal, you will receive your results only by: Marland Kitchen MyChart Message (if you have MyChart) OR . A paper copy in the mail If you have any lab test that is abnormal or we need to change your treatment, we will call you to review the results.   Testing/Procedures:  Your physician has requested that you have an echocardiogram. Echocardiography is a painless test that uses sound waves to create images of your heart. It provides your doctor with information about the size and shape of your heart and how well your heart's chambers and valves are working. This procedure takes approximately one hour. There are no restrictions for this procedure.     Follow-Up: At [facility], you and your health needs are our priority.  As part of our continuing mission to provide you with exceptional heart care, we have created designated Provider Care Teams.  These Care Teams include your primary Cardiologist (physician) and Advanced Practice Providers (APPs -  Physician Assistants and Nurse Practitioners) who all work together to provide you with the care you need, when you need it.  We recommend signing up for the patient portal called "MyChart".  Sign up information is provided on this After Visit Summary.  MyChart is used to connect with patients for Virtual Visits (Telemedicine).  Patients are able to view lab/test results, encounter notes, upcoming appointments, etc.  Non-urgent messages can be sent to your provider as well.   To learn more about what you can do with  MyChart, go to [email or web address].    Your next appointment:   Follow up after echo   The format for your next appointment:   In Person  Provider:   Kate Sable, MD   Other Instructions   Echocardiogram An echocardiogram is a procedure that uses painless sound waves (ultrasound) to produce an image of the heart. Images from an echocardiogram can provide important information about:  Signs of coronary artery disease (CAD).  Aneurysm detection. An aneurysm is a weak or damaged part of an artery wall that bulges out from the normal force of blood pumping through the body.  Heart size and shape. Changes in the size or shape of the heart can be associated with certain conditions, including heart failure, aneurysm, and CAD.  Heart muscle function.  Heart valve function.  Signs of a past heart attack.  Fluid buildup around the heart.  Thickening of the heart muscle.  A tumor or infectious growth around the heart valves. Tell a health care provider about:  Any allergies you have.  All medicines you are taking, including vitamins, herbs, eye drops, creams, and over-the-counter medicines.  Any blood disorders you have.  Any surgeries you have had.  Any medical conditions you have.  Whether you are pregnant or may be pregnant. What are the risks? Generally, this is a safe procedure. However, problems may occur, including:  Allergic reaction to dye (contrast) that may be used during the procedure. What happens before the procedure? No specific preparation is needed. You may eat and drink normally. What happens  during the procedure?   An IV tube may be inserted into one of your veins.  You may receive contrast through this tube. A contrast is an injection that improves the quality of the pictures from your heart.  A gel will be applied to your chest.  A wand-like tool (transducer) will be moved over your chest. The gel will help to transmit the sound waves  from the transducer.  The sound waves will harmlessly bounce off of your heart to allow the heart images to be captured in real-time motion. The images will be recorded on a computer. The procedure may vary among health care providers and hospitals. What happens after the procedure?  You may return to your normal, everyday life, including diet, activities, and medicines, unless your health care provider tells you not to do that. Summary  An echocardiogram is a procedure that uses painless sound waves (ultrasound) to produce an image of the heart.  Images from an echocardiogram can provide important information about the size and shape of your heart, heart muscle function, heart valve function, and fluid buildup around your heart.  You do not need to do anything to prepare before this procedure. You may eat and drink normally.  After the echocardiogram is completed, you may return to your normal, everyday life, unless your health care provider tells you not to do that. This information is not intended to replace advice given to you by your health care provider. Make sure you discuss any questions you have with your health care provider. Document Revised: 11/08/2018 Document Reviewed: 08/20/2016 Elsevier Patient Education  [place].

## 2020-03-02 NOTE — Progress Notes (Signed)
Cardiology Office Note:    Date:  03/02/2020   ID:  Charlene Coleman, DOB 17-Sep-1940, MRN [id number]  PCP:  Dion Body, MD  [facility] Cardiologist:  Kate Sable, MD  [place] Electrophysiologist:  None   Referring MD: Dion Body, MD   Chief Complaint  Patient presents with  . New Patient (Initial Visit)    Per Dr. Netty Starring PAF Atherosclerosis Ess. HTN. Meds reviewed verbally with patient.     History of Present Illness:    Charlene Coleman is a 79 y.o. female with a hx of paroxysmal atrial fibrillation, hypertension, hyperlipidemia, CAD (LHC 50% in D1, RCA 09/2007) who presents to establish care. Patient last follow-up with cardiology at [facility] about 3 weeks ago.  Patient states having nonspecific shortness of breath sometimes when she overexerts herself.  She also feels some fatigue and dizziness at home.  She checks her blood pressure frequently and has noticed low systolics in the 17B and heart rates in the 40s today.  She denies chest pain, exertion but states she feels like her heart is beating very slow and faint.  Denies palpitations.  Takes Eliquis as prescribed.  Upon chart review, patient had a Lexiscan Myoview performed on 09/30/2019 with mild lateral wall ischemia.  Cardiac monitor in the past revealed atrial fibrillation with 12.68% burden.  Patient started on Cardizem and Eliquis for stroke prevention.  Past Medical History:  Diagnosis Date  . Borderline diabetes   . Breast cancer ([place]) 2015   Left side - radiation - lumpectomy  . CAD (coronary artery disease)   . DCIS (ductal carcinoma in situ)   . Empty sella syndrome ([place])   . Gout   . Hyperlipidemia   . Hypertension   . Personal history of radiation therapy   . Skin cancer 2011    Past Surgical History:  Procedure Laterality Date  . ABDOMINAL HYSTERECTOMY    . BREAST BIOPSY Left 2015   positive  . BREAST LUMPECTOMY Left 09/23/13  . CHOLECYSTECTOMY    . GASTRIC  FUNDOPLICATION    . left lumpectomy      Current Medications: Current Meds  Medication Sig  . allopurinol (ZYLOPRIM) 300 MG tablet Take 300 mg by mouth daily.   Marland Kitchen apixaban (ELIQUIS) 5 MG TABS tablet Take by mouth.  Marland Kitchen atorvastatin (LIPITOR) 20 MG tablet Take 20 mg by mouth daily at 6 PM.   . Calcium Carbonate-Vitamin D 600-400 MG-UNIT per tablet Take 1 tablet by mouth 3 (three) times a week.   . diltiazem (CARDIZEM CD) 120 MG 24 hr capsule Take 120 mg by mouth daily.  Marland Kitchen lidocaine (LIDODERM) 5 % Place 1 patch onto the skin every 12 (twelve) hours. Remove & Discard patch within 12 hours or as directed by MD  . losartan (COZAAR) 100 MG tablet Take 100 mg by mouth daily.   . naproxen sodium (ANAPROX) 220 MG tablet Take by mouth.  . oxyCODONE-acetaminophen (PERCOCET) 5-325 MG tablet Take 1 tablet by mouth every 4 (four) hours as needed for severe pain.  . tamoxifen (NOLVADEX) 20 MG tablet TAKE 1 TABLET EVERY DAY  . [DISCONTINUED] metoprolol succinate (TOPROL-XL) 50 MG 24 hr tablet Take 25 mg by mouth daily.      Allergies:   Vasotec  [enalapril]   Social History   Socioeconomic History  . Marital status: Married    Spouse name: Not on file  . Number of children: Not on file  . Years of education: Not on file  .  Highest education level: Not on file  Occupational History  . Not on file  Tobacco Use  . Smoking status: Never Smoker  . Smokeless tobacco: Never Used  Vaping Use  . Vaping Use: Never used  Substance and Sexual Activity  . Alcohol use: No  . Drug use: No  . Sexual activity: Not on file  Other Topics Concern  . Not on file  Social History Narrative  . Not on file   Social Determinants of Health   Financial Resource Strain:   . Difficulty of Paying Living Expenses:   Food Insecurity:   . Worried About Charity fundraiser in the Last Year:   . Arboriculturist in the Last Year:   Transportation Needs:   . Film/video editor (Medical):   Marland Kitchen Lack of  Transportation (Non-Medical):   Physical Activity:   . Days of Exercise per Week:   . Minutes of Exercise per Session:   Stress:   . Feeling of Stress :   Social Connections:   . Frequency of Communication with Friends and Family:   . Frequency of Social Gatherings with Friends and Family:   . Attends Religious Services:   . Active Member of Clubs or Organizations:   . Attends Archivist Meetings:   Marland Kitchen Marital Status:      Family History: The patient's family history includes Breast cancer in her cousin; Breast cancer (age of onset: 4) in her maternal aunt.  ROS:   Please see the history of present illness.     All other systems reviewed and are negative.  EKGs/Labs/Other Studies Reviewed:    The following studies were reviewed today:   EKG:  EKG is  ordered today.  The ekg ordered today demonstrates sinus bradycardia otherwise normal EKG, heart rate 47  Recent Labs: 02/06/2020: BUN 19; Creatinine, Ser 1.03; Hemoglobin 13.3; Platelets 244; Potassium 4.1; Sodium 139  Recent Lipid Panel No results found for: CHOL, TRIG, HDL, CHOLHDL, VLDL, LDLCALC, LDLDIRECT  Physical Exam:    VS:  BP (!) 102/58 (BP Location: Right Arm, Patient Position: Sitting, Cuff Size: Normal)   Pulse (!) 47   Ht 5' (1.524 m)   Wt 165 lb (74.8 kg)   SpO2 97%   BMI 32.22 kg/m     Wt Readings from Last 3 Encounters:  03/02/20 165 lb (74.8 kg)  02/06/20 200 lb (90.7 kg)  01/25/20 175 lb (79.4 kg)     GEN:  Well nourished, well developed in no acute distress HEENT: Normal NECK: No JVD; No carotid bruits LYMPHATICS: No lymphadenopathy CARDIAC: Bradycardic, regular, no murmurs, rubs, gallops RESPIRATORY:  Clear to auscultation without rales, wheezing or rhonchi  ABDOMEN: Soft, non-tender, non-distended MUSCULOSKELETAL:  No edema; No deformity  SKIN: Warm and dry NEUROLOGIC:  Alert and oriented x 3 PSYCHIATRIC:  Normal affect   ASSESSMENT:    1. Coronary artery disease involving  native coronary artery of native heart without angina pectoris   2. Paroxysmal atrial fibrillation (HCC)   3. Essential hypertension   4. Shortness of breath    PLAN:    In order of problems listed above:  1. Patient with history of CAD, left heart cath in 2009 with moderate stenosis in the first diagonal and RCA.  Continue Eliquis and statin.  Currently denies chest pain. 2. Patient with history of paroxysmal atrial fibrillation, currently in sinus rhythm.  CHA2DS2-VASc of 4 (age, htn, gender).  Eliquis, Cardizem.  Get echocardiogram. 3. History of  hypertension, blood pressures currently low, occasional dizziness noted.  Stop Toprol-XL, stop hydralazine.  Continue Cardizem and losartan. 4. Patient with nonspecific shortness of breath and fatigue, denies chest pain.  Get echo to evaluate any structural dysfunction.  Low blood pressures could be contributing to overall fatigue.  Decrease BP meds as above.  Follow-up after echocardiogram.  Total encounter time 60 minutes  Greater than 50% was spent in counseling and coordination of care with the patient   Medication Adjustments/Labs and Tests Ordered: Current medicines are reviewed at length with the patient today.  Concerns regarding medicines are outlined above.  Orders Placed This Encounter  Procedures  . EKG 12-Lead  . ECHOCARDIOGRAM COMPLETE   No orders of the defined types were placed in this encounter.   Patient Instructions  Medication Instructions:   Your physician has recommended you make the following change in your medication:   1.  STOP taking your hydrALAZINE (APRESOLINE) 25 MG tablet 2.  STOP taking your metoprolol succinate (TOPROL-XL) 50 MG 24 hr tablet  *If you need a refill on your cardiac medications before your next appointment, please call your pharmacy*   Lab Work: None Ordered If you have labs (blood work) drawn today and your tests are completely normal, you will receive your results only  by: Marland Kitchen MyChart Message (if you have MyChart) OR . A paper copy in the mail If you have any lab test that is abnormal or we need to change your treatment, we will call you to review the results.   Testing/Procedures:  Your physician has requested that you have an echocardiogram. Echocardiography is a painless test that uses sound waves to create images of your heart. It provides your doctor with information about the size and shape of your heart and how well your heart's chambers and valves are working. This procedure takes approximately one hour. There are no restrictions for this procedure.     Follow-Up: At [facility], you and your health needs are our priority.  As part of our continuing mission to provide you with exceptional heart care, we have created designated Provider Care Teams.  These Care Teams include your primary Cardiologist (physician) and Advanced Practice Providers (APPs -  Physician Assistants and Nurse Practitioners) who all work together to provide you with the care you need, when you need it.  We recommend signing up for the patient portal called "MyChart".  Sign up information is provided on this After Visit Summary.  MyChart is used to connect with patients for Virtual Visits (Telemedicine).  Patients are able to view lab/test results, encounter notes, upcoming appointments, etc.  Non-urgent messages can be sent to your provider as well.   To learn more about what you can do with MyChart, go to [email or web address].    Your next appointment:   Follow up after echo   The format for your next appointment:   In Person  Provider:   Kate Sable, MD   Other Instructions   Echocardiogram An echocardiogram is a procedure that uses painless sound waves (ultrasound) to produce an image of the heart. Images from an echocardiogram can provide important information about:  Signs of coronary artery disease (CAD).  Aneurysm detection. An aneurysm is a weak or  damaged part of an artery wall that bulges out from the normal force of blood pumping through the body.  Heart size and shape. Changes in the size or shape of the heart can be associated with certain conditions, including heart failure, aneurysm, and  CAD.  Heart muscle function.  Heart valve function.  Signs of a past heart attack.  Fluid buildup around the heart.  Thickening of the heart muscle.  A tumor or infectious growth around the heart valves. Tell a health care provider about:  Any allergies you have.  All medicines you are taking, including vitamins, herbs, eye drops, creams, and over-the-counter medicines.  Any blood disorders you have.  Any surgeries you have had.  Any medical conditions you have.  Whether you are pregnant or may be pregnant. What are the risks? Generally, this is a safe procedure. However, problems may occur, including:  Allergic reaction to dye (contrast) that may be used during the procedure. What happens before the procedure? No specific preparation is needed. You may eat and drink normally. What happens during the procedure?   An IV tube may be inserted into one of your veins.  You may receive contrast through this tube. A contrast is an injection that improves the quality of the pictures from your heart.  A gel will be applied to your chest.  A wand-like tool (transducer) will be moved over your chest. The gel will help to transmit the sound waves from the transducer.  The sound waves will harmlessly bounce off of your heart to allow the heart images to be captured in real-time motion. The images will be recorded on a computer. The procedure may vary among health care providers and hospitals. What happens after the procedure?  You may return to your normal, everyday life, including diet, activities, and medicines, unless your health care provider tells you not to do that. Summary  An echocardiogram is a procedure that uses painless  sound waves (ultrasound) to produce an image of the heart.  Images from an echocardiogram can provide important information about the size and shape of your heart, heart muscle function, heart valve function, and fluid buildup around your heart.  You do not need to do anything to prepare before this procedure. You may eat and drink normally.  After the echocardiogram is completed, you may return to your normal, everyday life, unless your health care provider tells you not to do that. This information is not intended to replace advice given to you by your health care provider. Make sure you discuss any questions you have with your health care provider. Document Revised: 11/08/2018 Document Reviewed: 08/20/2016 Elsevier Patient Education  2020 [place], Kate Sable, MD  03/02/2020 2:04 PM    [place]

## 2020-03-30 ENCOUNTER — Other Ambulatory Visit: Payer: Self-pay

## 2020-03-30 ENCOUNTER — Ambulatory Visit (INDEPENDENT_AMBULATORY_CARE_PROVIDER_SITE_OTHER): Payer: Medicare Other

## 2020-03-30 DIAGNOSIS — R0602 Shortness of breath: Secondary | ICD-10-CM

## 2020-03-30 DIAGNOSIS — I10 Essential (primary) hypertension: Secondary | ICD-10-CM

## 2020-03-30 DIAGNOSIS — I480 Paroxysmal atrial fibrillation: Secondary | ICD-10-CM | POA: Diagnosis not present

## 2020-03-30 DIAGNOSIS — I2510 Atherosclerotic heart disease of native coronary artery without angina pectoris: Secondary | ICD-10-CM | POA: Diagnosis not present

## 2020-03-31 LAB — ECHOCARDIOGRAM COMPLETE
Area-P 1/2: 3.6 cm2
S' Lateral: 3 cm

## 2020-04-03 ENCOUNTER — Encounter: Payer: Self-pay | Admitting: Cardiology

## 2020-04-03 ENCOUNTER — Other Ambulatory Visit: Payer: Self-pay

## 2020-04-03 ENCOUNTER — Ambulatory Visit (INDEPENDENT_AMBULATORY_CARE_PROVIDER_SITE_OTHER): Payer: Medicare Other | Admitting: Cardiology

## 2020-04-03 VITALS — BP 126/90 | HR 49 | Ht 60.0 in | Wt 167.0 lb

## 2020-04-03 DIAGNOSIS — I10 Essential (primary) hypertension: Secondary | ICD-10-CM

## 2020-04-03 DIAGNOSIS — I480 Paroxysmal atrial fibrillation: Secondary | ICD-10-CM

## 2020-04-03 DIAGNOSIS — I5189 Other ill-defined heart diseases: Secondary | ICD-10-CM

## 2020-04-03 DIAGNOSIS — I2510 Atherosclerotic heart disease of native coronary artery without angina pectoris: Secondary | ICD-10-CM

## 2020-04-03 MED ORDER — FUROSEMIDE 20 MG PO TABS
20.0000 mg | ORAL_TABLET | Freq: Every day | ORAL | 1 refills | Status: DC
Start: 2020-04-03 — End: 2020-05-28

## 2020-04-03 MED ORDER — AMIODARONE HCL 200 MG PO TABS
ORAL_TABLET | ORAL | 2 refills | Status: DC
Start: 2020-04-03 — End: 2020-04-16

## 2020-04-03 NOTE — Progress Notes (Signed)
Cardiology Office Note:    Date:  04/03/2020   ID:  Charlene Coleman, DOB 03-Feb-1941, MRN [id number]  PCP:  Dion Body, MD  [facility] Cardiologist:  Kate Sable, MD  [place] Electrophysiologist:  None   Referring MD: Dion Body, MD   Chief Complaint  Patient presents with  . Follow-up    Echo results. Meds reviewed by the pt. verbally. Pt. c/o shortness of breath at times.     History of Present Illness:    Charlene Coleman is a 79 y.o. female with a hx of paroxysmal atrial fibrillation, hypertension, hyperlipidemia, CAD (LHC 50% in D1, RCA 09/2007) who presents for follow-up.  Patient previously seen to establish care.  Patient had some nonspecific shortness of breath, echocardiogram was ordered to evaluate cardiac function.  Blood pressure also noted to be low in the 90s, Toprol-XL and hydralazine was stopped after last visit.  Cardizem and losartan was continued.  Patient states having shortness of breath usually when she lays flat on her back.  She denies edema.  States being very symptomatic whenever she goes into atrial fibrillation.  Describes symptoms of palpitations, shortness of breath and dizziness, last episode was last week when she checked her heart rates to be as high as 170s.  Prior notes Upon chart review, patient had a Lexiscan Myoview performed on 09/30/2019 with mild lateral wall ischemia.  Cardiac monitor in the past revealed atrial fibrillation with 12.68% burden.    Past Medical History:  Diagnosis Date  . Borderline diabetes   . Breast cancer ([place]) 2015   Left side - radiation - lumpectomy  . CAD (coronary artery disease)   . DCIS (ductal carcinoma in situ)   . Empty sella syndrome ([place])   . Gout   . Hyperlipidemia   . Hypertension   . Personal history of radiation therapy   . Skin cancer 2011    Past Surgical History:  Procedure Laterality Date  . ABDOMINAL HYSTERECTOMY    . BREAST BIOPSY Left 2015   positive  . BREAST  LUMPECTOMY Left 09/23/13  . CHOLECYSTECTOMY    . GASTRIC FUNDOPLICATION    . left lumpectomy      Current Medications: Current Meds  Medication Sig  . allopurinol (ZYLOPRIM) 300 MG tablet Take 300 mg by mouth daily.   Marland Kitchen apixaban (ELIQUIS) 5 MG TABS tablet Take by mouth.  Marland Kitchen atorvastatin (LIPITOR) 20 MG tablet Take 20 mg by mouth daily at 6 PM.   . Calcium Carbonate-Vitamin D 600-400 MG-UNIT per tablet Take 1 tablet by mouth 3 (three) times a week.   . famotidine (PEPCID) 40 MG tablet Take 40 mg by mouth daily.  Marland Kitchen gabapentin (NEURONTIN) 100 MG capsule Take 100 mg by mouth at bedtime. Take two capsules in the am & three capsules in the pm  . losartan (COZAAR) 100 MG tablet Take 100 mg by mouth daily.   . methocarbamol (ROBAXIN) 500 MG tablet Take 500 mg by mouth in the morning and at bedtime.  Marland Kitchen oxybutynin (DITROPAN-XL) 5 MG 24 hr tablet Take 5 mg by mouth in the morning.  . [DISCONTINUED] diltiazem (CARDIZEM CD) 120 MG 24 hr capsule Take 120 mg by mouth daily.     Allergies:   Enalapril   Social History   Socioeconomic History  . Marital status: Married    Spouse name: Not on file  . Number of children: Not on file  . Years of education: Not on file  . Highest education  level: Not on file  Occupational History  . Not on file  Tobacco Use  . Smoking status: Never Smoker  . Smokeless tobacco: Never Used  Vaping Use  . Vaping Use: Never used  Substance and Sexual Activity  . Alcohol use: No  . Drug use: No  . Sexual activity: Not on file  Other Topics Concern  . Not on file  Social History Narrative  . Not on file   Social Determinants of Health   Financial Resource Strain:   . Difficulty of Paying Living Expenses: Not on file  Food Insecurity:   . Worried About Charity fundraiser in the Last Year: Not on file  . Ran Out of Food in the Last Year: Not on file  Transportation Needs:   . Lack of Transportation (Medical): Not on file  . Lack of Transportation  (Non-Medical): Not on file  Physical Activity:   . Days of Exercise per Week: Not on file  . Minutes of Exercise per Session: Not on file  Stress:   . Feeling of Stress : Not on file  Social Connections:   . Frequency of Communication with Friends and Family: Not on file  . Frequency of Social Gatherings with Friends and Family: Not on file  . Attends Religious Services: Not on file  . Active Member of Clubs or Organizations: Not on file  . Attends Archivist Meetings: Not on file  . Marital Status: Not on file     Family History: The patient's family history includes Breast cancer in her cousin; Breast cancer (age of onset: 35) in her maternal aunt.  ROS:   Please see the history of present illness.     All other systems reviewed and are negative.  EKGs/Labs/Other Studies Reviewed:    The following studies were reviewed today:   EKG:  EKG is  ordered today.  The ekg ordered today demonstrates sinus bradycardia otherwise normal EKG, heart rate 47  Recent Labs: 02/06/2020: BUN 19; Creatinine, Ser 1.03; Hemoglobin 13.3; Platelets 244; Potassium 4.1; Sodium 139  Recent Lipid Panel No results found for: CHOL, TRIG, HDL, CHOLHDL, VLDL, LDLCALC, LDLDIRECT  Physical Exam:    VS:  BP 126/90 (BP Location: Left Arm, Patient Position: Sitting, Cuff Size: Normal)   Pulse (!) 49   Ht 5' (1.524 m)   Wt 167 lb (75.8 kg)   BMI 32.61 kg/m     Wt Readings from Last 3 Encounters:  04/03/20 167 lb (75.8 kg)  03/02/20 165 lb (74.8 kg)  02/06/20 200 lb (90.7 kg)     GEN:  Well nourished, well developed in no acute distress HEENT: Normal NECK: No JVD; No carotid bruits LYMPHATICS: No lymphadenopathy CARDIAC: Bradycardic, regular, no murmurs, rubs, gallops RESPIRATORY:  Clear to auscultation without rales, wheezing or rhonchi  ABDOMEN: Soft, non-tender, non-distended MUSCULOSKELETAL:  No edema; No deformity  SKIN: Warm and dry NEUROLOGIC:  Alert and oriented x  3 PSYCHIATRIC:  Normal affect   ASSESSMENT:    1. Coronary artery disease involving native coronary artery of native heart without angina pectoris   2. Paroxysmal atrial fibrillation (HCC)   3. Diastolic dysfunction   4. Essential hypertension    PLAN:    In order of problems listed above:  1. Patient with history of CAD, left heart cath in 2009 with moderate stenosis in the first diagonal and RCA.  Currently denies chest pain.  Continue Eliquis and statin.  Echo on 03/30/2020 showed normal systolic function,  grade 2 diastolic dysfunction, EF 55 to 60%. 2. Patient with history of paroxysmal atrial fibrillation, currently in sinus rhythm.  CHA2DS2-VASc of 4 (age, htn, gender).  Becomes very symptomatic when she goes into atrial fibrillation.  Current heart rate 49.  Stop Cardizem.  Start amiodarone 400 mg twice daily x1 week, decrease to 200 twice daily after.  Hopefully this will help keep patient in sinus rhythm to help with her symptoms.  Continue Eliquis.  If symptoms persist, will plan to refer patient to atrial fibrillation clinic for additional input. 3. Echocardiogram shows grade 2 diastolic dysfunction, patient describes symptoms of orthopnea.  Start Lasix 20 mg daily. 4. History of hypertension, blood pressures currently low, occasional dizziness noted.  Stop Toprol-XL, stop hydralazine.  Continue Cardizem and losartan.  Follow-up in 1 month.  Total encounter time 40 minutes  Greater than 50% was spent in counseling and coordination of care with the patient   Medication Adjustments/Labs and Tests Ordered: Current medicines are reviewed at length with the patient today.  Concerns regarding medicines are outlined above.  Orders Placed This Encounter  Procedures  . EKG 12-Lead   Meds ordered this encounter  Medications  . furosemide (LASIX) 20 MG tablet    Sig: Take 1 tablet (20 mg total) by mouth daily.    Dispense:  90 tablet    Refill:  1  . amiodarone (PACERONE) 200 MG  tablet    Sig: Take 2 tablets (400mg ) twice daily for 1 week Then REDUCE to 1 tablet (200mg ) twice daily    Dispense:  60 tablet    Refill:  2    Patient Instructions  Medication Instructions:  Your physician has recommended you make the following change in your medication:  STOP Cardizem  START Lasix (furosemide) 20 mg daily. An Rx has been sent to your pharmacy.  START Amiodarone 2 tablets (400mg ) twice daily. Then REDUCE to 1 tablet (200mg ) twice daily.  *If you need a refill on your cardiac medications before your next appointment, please call your pharmacy*   Lab Work: None ordered If you have labs (blood work) drawn today and your tests are completely normal, you will receive your results only by: Marland Kitchen MyChart Message (if you have MyChart) OR . A paper copy in the mail If you have any lab test that is abnormal or we need to change your treatment, we will call you to review the results.   Testing/Procedures: None ordered   Follow-Up: At [facility], you and your health needs are our priority.  As part of our continuing mission to provide you with exceptional heart care, we have created designated Provider Care Teams.  These Care Teams include your primary Cardiologist (physician) and Advanced Practice Providers (APPs -  Physician Assistants and Nurse Practitioners) who all work together to provide you with the care you need, when you need it.  We recommend signing up for the patient portal called "MyChart".  Sign up information is provided on this After Visit Summary.  MyChart is used to connect with patients for Virtual Visits (Telemedicine).  Patients are able to view lab/test results, encounter notes, upcoming appointments, etc.  Non-urgent messages can be sent to your provider as well.   To learn more about what you can do with MyChart, go to [email or web address].    Your next appointment:   4 week(s)  The format for your next appointment:   In Person  Provider:    Kate Sable, MD   Other Instructions N/A  Signed, Kate Sable, MD  04/03/2020 12:44 PM    [facility]

## 2020-04-03 NOTE — Patient Instructions (Addendum)
Medication Instructions:  Your physician has recommended you make the following change in your medication:  STOP Cardizem  START Lasix (furosemide) 20 mg daily. An Rx has been sent to your pharmacy.  START Amiodarone 2 tablets (400mg ) twice daily. Then REDUCE to 1 tablet (200mg ) twice daily.  *If you need a refill on your cardiac medications before your next appointment, please call your pharmacy*   Lab Work: None ordered If you have labs (blood work) drawn today and your tests are completely normal, you will receive your results only by: Marland Kitchen MyChart Message (if you have MyChart) OR . A paper copy in the mail If you have any lab test that is abnormal or we need to change your treatment, we will call you to review the results.   Testing/Procedures: None ordered   Follow-Up: At [facility], you and your health needs are our priority.  As part of our continuing mission to provide you with exceptional heart care, we have created designated Provider Care Teams.  These Care Teams include your primary Cardiologist (physician) and Advanced Practice Providers (APPs -  Physician Assistants and Nurse Practitioners) who all work together to provide you with the care you need, when you need it.  We recommend signing up for the patient portal called "MyChart".  Sign up information is provided on this After Visit Summary.  MyChart is used to connect with patients for Virtual Visits (Telemedicine).  Patients are able to view lab/test results, encounter notes, upcoming appointments, etc.  Non-urgent messages can be sent to your provider as well.   To learn more about what you can do with MyChart, go to [email or web address].    Your next appointment:   4 week(s)  The format for your next appointment:   In Person  Provider:   Kate Sable, MD   Other Instructions N/A

## 2020-04-16 ENCOUNTER — Other Ambulatory Visit: Payer: Self-pay

## 2020-04-16 MED ORDER — AMIODARONE HCL 200 MG PO TABS: 200.0000 mg | ORAL_TABLET | Freq: Two times a day (BID) | ORAL | 3 refills | Status: DC

## 2020-05-01 ENCOUNTER — Other Ambulatory Visit: Payer: Self-pay

## 2020-05-01 ENCOUNTER — Ambulatory Visit (INDEPENDENT_AMBULATORY_CARE_PROVIDER_SITE_OTHER): Payer: Medicare Other | Admitting: Cardiology

## 2020-05-01 ENCOUNTER — Encounter: Payer: Self-pay | Admitting: Cardiology

## 2020-05-01 VITALS — BP 170/84 | HR 47 | Ht 60.0 in | Wt 160.4 lb

## 2020-05-01 DIAGNOSIS — I5189 Other ill-defined heart diseases: Secondary | ICD-10-CM | POA: Diagnosis not present

## 2020-05-01 DIAGNOSIS — I10 Essential (primary) hypertension: Secondary | ICD-10-CM

## 2020-05-01 DIAGNOSIS — I2510 Atherosclerotic heart disease of native coronary artery without angina pectoris: Secondary | ICD-10-CM | POA: Diagnosis not present

## 2020-05-01 DIAGNOSIS — I480 Paroxysmal atrial fibrillation: Secondary | ICD-10-CM

## 2020-05-01 MED ORDER — AMIODARONE HCL 200 MG PO TABS: 200.0000 mg | ORAL_TABLET | Freq: Every day | ORAL | 3 refills | Status: DC

## 2020-05-01 MED ORDER — AMLODIPINE BESYLATE 5 MG PO TABS
5.0000 mg | ORAL_TABLET | Freq: Every day | ORAL | 3 refills | Status: DC
Start: 2020-05-01 — End: 2020-05-25

## 2020-05-01 MED ORDER — AMLODIPINE BESYLATE 5 MG PO TABS: 5.0000 mg | ORAL_TABLET | Freq: Every day | ORAL | 0 refills | Status: DC

## 2020-05-01 NOTE — Patient Instructions (Signed)
Medication Instructions:   Your physician has recommended you make the following change in your medication:   1)  DECREASE your amiodarone (PACERONE) 200 MG tablet: Take 1 tablet (200 mg total) by mouth daily 2)  START amLODipine (NORVASC) 5 MG tablet:  Take 1 tablet (5 mg total) by mouth daily.   *If you need a refill on your cardiac medications before your next appointment, please call your pharmacy*   Lab Work: None Ordered If you have labs (blood work) drawn today and your tests are completely normal, you will receive your results only by: Marland Kitchen MyChart Message (if you have MyChart) OR . A paper copy in the mail If you have any lab test that is abnormal or we need to change your treatment, we will call you to review the results.   Testing/Procedures: None Ordered   Follow-Up: At [facility], you and your health needs are our priority.  As part of our continuing mission to provide you with exceptional heart care, we have created designated Provider Care Teams.  These Care Teams include your primary Cardiologist (physician) and Advanced Practice Providers (APPs -  Physician Assistants and Nurse Practitioners) who all work together to provide you with the care you need, when you need it.  We recommend signing up for the patient portal called "MyChart".  Sign up information is provided on this After Visit Summary.  MyChart is used to connect with patients for Virtual Visits (Telemedicine).  Patients are able to view lab/test results, encounter notes, upcoming appointments, etc.  Non-urgent messages can be sent to your provider as well.   To learn more about what you can do with MyChart, go to [email or web address].    Your next appointment:   2 month(s)  The format for your next appointment:   In Person  Provider:   Kate Sable, MD   Other Instructions

## 2020-05-01 NOTE — Progress Notes (Signed)
Cardiology Office Note:    Date:  05/01/2020   ID:  Charlene Coleman, DOB 1940-08-20, MRN [id number]  PCP:  Charlene Body, MD  [facility] Cardiologist:  Kate Sable, MD  [place] Electrophysiologist:  None   Referring MD: Charlene Body, MD   Chief Complaint  Patient presents with  . OTHER    4 wk f/u no complaints today. Meds reviewed verbally with pt.    History of Present Illness:    Charlene Coleman is a 79 y.o. female with a hx of paroxysmal atrial fibrillation, hypertension, hyperlipidemia, CAD (LHC 50% in D1, RCA 09/2007) who presents for follow-up.  Patient had symptoms of shortness of breath, orthopnea on prior visits.  Echo showed grade 2 diastolic dysfunction.  She was started on Lasix.  Her symptoms of shortness of breath and orthopnea have improved since starting Lasix.  Currently denies edema.  Her blood pressures have been running high, 409W to 119J systolic at home.  Takes all her medications as prescribed.  Has occasional quivering in her heart but denies overt palpitations.  Currently takes amiodarone 200 mg twice daily.  Prior notes Upon chart review, patient had a Lexiscan Myoview performed on 09/30/2019 with mild lateral wall ischemia.  Cardiac monitor in the past revealed atrial fibrillation with 12.68% burden. LHC 50% in D1, RCA 09/2007 .  Echo 10/7827 normal systolic function, grade 2 diastolic dysfunction, moderate pulmonary hypertension.  Past Medical History:  Diagnosis Date  . Borderline diabetes   . Breast cancer ([place]) 2015   Left side - radiation - lumpectomy  . CAD (coronary artery disease)   . DCIS (ductal carcinoma in situ)   . Empty sella syndrome ([place])   . Gout   . Hyperlipidemia   . Hypertension   . Personal history of radiation therapy   . Skin cancer 2011    Past Surgical History:  Procedure Laterality Date  . ABDOMINAL HYSTERECTOMY    . BREAST BIOPSY Left 2015   positive  . BREAST LUMPECTOMY Left 09/23/13  .  CHOLECYSTECTOMY    . GASTRIC FUNDOPLICATION    . left lumpectomy      Current Medications: Current Meds  Medication Sig  . allopurinol (ZYLOPRIM) 300 MG tablet Take 300 mg by mouth daily.   Marland Kitchen amiodarone (PACERONE) 200 MG tablet Take 1 tablet (200 mg total) by mouth daily.  Marland Kitchen apixaban (ELIQUIS) 5 MG TABS tablet Take 5 mg by mouth 2 (two) times daily.   Marland Kitchen atorvastatin (LIPITOR) 20 MG tablet Take 20 mg by mouth daily at 6 PM.   . Calcium Carbonate-Vitamin D 600-400 MG-UNIT per tablet Take 1 tablet by mouth 3 (three) times a week.   . famotidine (PEPCID) 40 MG tablet Take 40 mg by mouth daily.  . furosemide (LASIX) 20 MG tablet Take 1 tablet (20 mg total) by mouth daily.  Marland Kitchen gabapentin (NEURONTIN) 100 MG capsule Take 100 mg by mouth at bedtime. Take two capsules in the am & three capsules in the pm  . losartan (COZAAR) 100 MG tablet Take 100 mg by mouth daily.   . methocarbamol (ROBAXIN) 500 MG tablet Take 500 mg by mouth in the morning and at bedtime.  Marland Kitchen oxybutynin (DITROPAN-XL) 5 MG 24 hr tablet Take 5 mg by mouth in the morning.  . [DISCONTINUED] amiodarone (PACERONE) 200 MG tablet Take 1 tablet (200 mg total) by mouth 2 (two) times daily.     Allergies:   Enalapril   Social History   Socioeconomic  History  . Marital status: Married    Spouse name: Not on file  . Number of children: Not on file  . Years of education: Not on file  . Highest education level: Not on file  Occupational History  . Not on file  Tobacco Use  . Smoking status: Never Smoker  . Smokeless tobacco: Never Used  Vaping Use  . Vaping Use: Never used  Substance and Sexual Activity  . Alcohol use: No  . Drug use: No  . Sexual activity: Not on file  Other Topics Concern  . Not on file  Social History Narrative  . Not on file   Social Determinants of Health   Financial Resource Strain:   . Difficulty of Paying Living Expenses: Not on file  Food Insecurity:   . Worried About Charity fundraiser in the  Last Year: Not on file  . Ran Out of Food in the Last Year: Not on file  Transportation Needs:   . Lack of Transportation (Medical): Not on file  . Lack of Transportation (Non-Medical): Not on file  Physical Activity:   . Days of Exercise per Week: Not on file  . Minutes of Exercise per Session: Not on file  Stress:   . Feeling of Stress : Not on file  Social Connections:   . Frequency of Communication with Friends and Family: Not on file  . Frequency of Social Gatherings with Friends and Family: Not on file  . Attends Religious Services: Not on file  . Active Member of Clubs or Organizations: Not on file  . Attends Archivist Meetings: Not on file  . Marital Status: Not on file     Family History: The patient's family history includes Breast cancer in her cousin; Breast cancer (age of onset: 42) in her maternal aunt.  ROS:   Please see the history of present illness.     All other systems reviewed and are negative.  EKGs/Labs/Other Studies Reviewed:    The following studies were reviewed today:   EKG:  EKG is  ordered today.  The ekg ordered today demonstrates sinus bradycardia otherwise normal EKG, heart rate 47  Recent Labs: 02/06/2020: BUN 19; Creatinine, Ser 1.03; Hemoglobin 13.3; Platelets 244; Potassium 4.1; Sodium 139  Recent Lipid Panel No results found for: CHOL, TRIG, HDL, CHOLHDL, VLDL, LDLCALC, LDLDIRECT  Physical Exam:    VS:  BP (!) 170/84 (BP Location: Left Arm, Patient Position: Sitting, Cuff Size: Normal) Comment: After EKG  Pulse (!) 47   Ht 5' (1.524 m)   Wt 160 lb 6 oz (72.7 kg)   SpO2 98%   BMI 31.32 kg/m     Wt Readings from Last 3 Encounters:  05/01/20 160 lb 6 oz (72.7 kg)  04/03/20 167 lb (75.8 kg)  03/02/20 165 lb (74.8 kg)     GEN:  Well nourished, well developed in no acute distress HEENT: Normal NECK: No JVD; No carotid bruits LYMPHATICS: No lymphadenopathy CARDIAC: Bradycardic, regular, no murmurs, rubs,  gallops RESPIRATORY:  Clear to auscultation without rales, wheezing or rhonchi  ABDOMEN: Soft, non-tender, non-distended MUSCULOSKELETAL:  No edema; No deformity  SKIN: Warm and dry NEUROLOGIC:  Alert and oriented x 3 PSYCHIATRIC:  Normal affect   ASSESSMENT:    1. Coronary artery disease involving native coronary artery of native heart without angina pectoris   2. Paroxysmal atrial fibrillation (HCC)   3. Diastolic dysfunction   4. Essential hypertension    PLAN:  In order of problems listed above:  1. history of CAD, lhc in 2009 with moderate stenosis in the first diagonal and RCA.  Currently denies chest pain.  Continue Eliquis and statin.  Last echo showed normal systolic function. 2. history of paroxysmal atrial fibrillation, currently in sinus rhythm/sinus bradycardia.  CHA2DS2-VASc of 4 (age, htn, gender).  Becomes very symptomatic when she goes into atrial fibrillation.  Current heart rate 49.  Stop Cardizem.  Decrease amiodarone to 200 mg daily. 3. Grade 2 diastolic dysfunction on last echo, shortness of breath and orthopnea resolved with Lasix.  Continue Lasix 20 mg daily. 4. History of hypertension, blood pressure elevated.  Start amlodipine 5 mg daily.  Continue losartan 100 mg.  Follow-up in 2 months.  Total encounter time 40 minutes  Greater than 50% was spent in counseling and coordination of care with the patient   Medication Adjustments/Labs and Tests Ordered: Current medicines are reviewed at length with the patient today.  Concerns regarding medicines are outlined above.  Orders Placed This Encounter  Procedures  . EKG 12-Lead   Meds ordered this encounter  Medications  . amiodarone (PACERONE) 200 MG tablet    Sig: Take 1 tablet (200 mg total) by mouth daily.    Dispense:  90 tablet    Refill:  3  . amLODipine (NORVASC) 5 MG tablet    Sig: Take 1 tablet (5 mg total) by mouth daily.    Dispense:  90 tablet    Refill:  3  . amLODipine (NORVASC) 5 MG  tablet    Sig: Take 1 tablet (5 mg total) by mouth daily.    Dispense:  30 tablet    Refill:  0    Patient Instructions  Medication Instructions:   Your physician has recommended you make the following change in your medication:   1)  DECREASE your amiodarone (PACERONE) 200 MG tablet: Take 1 tablet (200 mg total) by mouth daily 2)  START amLODipine (NORVASC) 5 MG tablet:  Take 1 tablet (5 mg total) by mouth daily.   *If you need a refill on your cardiac medications before your next appointment, please call your pharmacy*   Lab Work: None Ordered If you have labs (blood work) drawn today and your tests are completely normal, you will receive your results only by: Marland Kitchen MyChart Message (if you have MyChart) OR . A paper copy in the mail If you have any lab test that is abnormal or we need to change your treatment, we will call you to review the results.   Testing/Procedures: None Ordered   Follow-Up: At [facility], you and your health needs are our priority.  As part of our continuing mission to provide you with exceptional heart care, we have created designated Provider Care Teams.  These Care Teams include your primary Cardiologist (physician) and Advanced Practice Providers (APPs -  Physician Assistants and Nurse Practitioners) who all work together to provide you with the care you need, when you need it.  We recommend signing up for the patient portal called "MyChart".  Sign up information is provided on this After Visit Summary.  MyChart is used to connect with patients for Virtual Visits (Telemedicine).  Patients are able to view lab/test results, encounter notes, upcoming appointments, etc.  Non-urgent messages can be sent to your provider as well.   To learn more about what you can do with MyChart, go to [email or web address].    Your next appointment:   2 month(s)  The format  for your next appointment:   In Person  Provider:   Kate Sable, MD   Other  Instructions     Signed, Kate Sable, MD  05/01/2020 12:57 PM    [place]

## 2020-05-25 ENCOUNTER — Other Ambulatory Visit: Payer: Self-pay | Admitting: *Deleted

## 2020-05-25 MED ORDER — AMLODIPINE BESYLATE 5 MG PO TABS: 5.0000 mg | ORAL_TABLET | Freq: Every day | ORAL | 0 refills | Status: DC

## 2020-05-28 ENCOUNTER — Other Ambulatory Visit: Payer: Self-pay | Admitting: *Deleted

## 2020-05-28 MED ORDER — FUROSEMIDE 20 MG PO TABS: 20.0000 mg | ORAL_TABLET | Freq: Every day | ORAL | 0 refills | Status: DC

## 2020-06-23 ENCOUNTER — Telehealth: Payer: Self-pay | Admitting: Cardiology

## 2020-06-23 NOTE — Telephone Encounter (Signed)
Patient has a follow-up visit with Dr. Garen Lah scheduled for 07/06/2020 which is before planned procedure. Therefore, pre-op risk assessment can be addressed at that visit. Will route clearance form to Dr. Garen Lah so he is aware and will add "pre-op eval" to appointment notes for that visit. Will remove from pre-op pool.  Darreld Mclean, PA-C 06/23/2020 4:48 PM

## 2020-06-23 NOTE — Telephone Encounter (Signed)
   [facility] Pre-operative Risk Assessment    HEARTCARE STAFF: - Please ensure there is not already an duplicate clearance open for this procedure. - Under Visit Info/Reason for Call, type in Other and utilize the format Clearance MM/DD/YY or Clearance TBD. Do not use dashes or single digits. - If request is for dental extraction, please clarify the # of teeth to be extracted.  Request for surgical clearance:  1. What type of surgery is being performed? r total hip replacement  2. When is this surgery scheduled? 08/05/20  3. What type of clearance is required (medical clearance vs. Pharmacy clearance to hold med vs. Both)? Not noted  4. Are there any medications that need to be held prior to surgery and how long?not noted  5. Practice name and name of physician performing surgery? [facility] Kurtis Bushman  6. What is the office phone number? [phone]   7.   What is the office fax number? [phone]  8.   Anesthesia type (None, local, MAC, general) ? Spinal/local   Charlene Coleman 06/23/2020, 3:34 PM  _________________________________________________________________   (provider comments below)

## 2020-07-06 ENCOUNTER — Other Ambulatory Visit: Payer: Self-pay

## 2020-07-06 ENCOUNTER — Encounter: Payer: Self-pay | Admitting: Cardiology

## 2020-07-06 ENCOUNTER — Ambulatory Visit (INDEPENDENT_AMBULATORY_CARE_PROVIDER_SITE_OTHER): Payer: Medicare Other | Admitting: Cardiology

## 2020-07-06 VITALS — BP 142/72 | HR 52 | Ht 60.0 in | Wt 161.8 lb

## 2020-07-06 DIAGNOSIS — I5189 Other ill-defined heart diseases: Secondary | ICD-10-CM

## 2020-07-06 DIAGNOSIS — Z01818 Encounter for other preprocedural examination: Secondary | ICD-10-CM

## 2020-07-06 DIAGNOSIS — I480 Paroxysmal atrial fibrillation: Secondary | ICD-10-CM | POA: Diagnosis not present

## 2020-07-06 DIAGNOSIS — I2510 Atherosclerotic heart disease of native coronary artery without angina pectoris: Secondary | ICD-10-CM

## 2020-07-06 DIAGNOSIS — I10 Essential (primary) hypertension: Secondary | ICD-10-CM

## 2020-07-06 MED ORDER — IRBESARTAN 300 MG PO TABS
300.0000 mg | ORAL_TABLET | Freq: Every day | ORAL | 3 refills | Status: DC
Start: 2020-07-06 — End: 2021-05-20

## 2020-07-06 MED ORDER — IRBESARTAN 300 MG PO TABS
300.0000 mg | ORAL_TABLET | Freq: Every day | ORAL | 0 refills | Status: DC
Start: 2020-07-06 — End: 2020-08-03

## 2020-07-06 NOTE — Patient Instructions (Signed)
Medication Instructions:   Your physician has recommended you make the following change in your medication:    2.  START taking irbesartan (AVAPRO) 300 MG: Take 1 tablet by mouth daily.  1.  STOP taking:      *If you need a refill on your cardiac medications before your next appointment, please call your pharmacy*   Lab Work: None Ordered If you have labs (blood work) drawn today and your tests are completely normal, you will receive your results only by: Marland Kitchen MyChart Message (if you have MyChart) OR . A paper copy in the mail If you have any lab test that is abnormal or we need to change your treatment, we will call you to review the results.   Testing/Procedures: None Ordered   Follow-Up: At [facility], you and your health needs are our priority.  As part of our continuing mission to provide you with exceptional heart care, we have created designated Provider Care Teams.  These Care Teams include your primary Cardiologist (physician) and Advanced Practice Providers (APPs -  Physician Assistants and Nurse Practitioners) who all work together to provide you with the care you need, when you need it.  We recommend signing up for the patient portal called "MyChart".  Sign up information is provided on this After Visit Summary.  MyChart is used to connect with patients for Virtual Visits (Telemedicine).  Patients are able to view lab/test results, encounter notes, upcoming appointments, etc.  Non-urgent messages can be sent to your provider as well.   To learn more about what you can do with MyChart, go to [email or web address].    Your next appointment:   3 week(s)  The format for your next appointment:   In Person  Provider:   You may see Kate Sable, MD or one of the following Advanced Practice Providers on your designated Care Team:    Murray Hodgkins, NP  Christell Faith, PA-C  Marrianne Mood, PA-C  Cadence Haugen, [place]  Laurann Montana, NP    Other  Instructions

## 2020-07-06 NOTE — Progress Notes (Signed)
Cardiology Office Note:    Date:  07/06/2020   ID:  Charlene Coleman, DOB October 19, 1940, MRN [id number]  PCP:  Charlene Body, MD  [facility] Cardiologist:  Kate Sable, MD  [place] Electrophysiologist:  None   Referring MD: Charlene Body, MD   Chief Complaint  Patient presents with  . Follow-up    2 months  pt c/o swelling in ankles, bilaterally--left worse than right, both are a little red    History of Present Illness:    Charlene Coleman is a 79 y.o. female with a hx of paroxysmal atrial fibrillation, hypertension, hyperlipidemia, CAD (LHC 50% in D1, RCA 09/2007), HFpEF who presents for preop evaluation.  Patient is scheduled to undergo right total hip replacement in about a month.  Was previously seen for paroxysmal atrial fibrillation.  Maintaining sinus rhythm on amiodarone.  Last echo showed preserved ejection fraction.  She has some lower extremity edema which occurred after starting amlodipine.  Otherwise feels well, denies palpitations.   Prior notes Upon chart review, patient had a Lexiscan Myoview performed on 09/30/2019 with mild lateral wall ischemia.  Cardiac monitor in the past revealed atrial fibrillation with 12.68% burden. LHC 50% in D1, RCA 09/2007 .  Echo 11/7844 normal systolic function, grade 2 diastolic dysfunction, moderate pulmonary hypertension.  Past Medical History:  Diagnosis Date  . Borderline diabetes   . Breast cancer ([place]) 2015   Left side - radiation - lumpectomy  . CAD (coronary artery disease)   . DCIS (ductal carcinoma in situ)   . Empty sella syndrome ([place])   . Gout   . Hyperlipidemia   . Hypertension   . Personal history of radiation therapy   . Skin cancer 2011    Past Surgical History:  Procedure Laterality Date  . ABDOMINAL HYSTERECTOMY    . BREAST BIOPSY Left 2015   positive  . BREAST LUMPECTOMY Left 09/23/13  . CHOLECYSTECTOMY    . GASTRIC FUNDOPLICATION    . left lumpectomy      Current  Medications: Current Meds  Medication Sig  . allopurinol (ZYLOPRIM) 300 MG tablet Take 300 mg by mouth daily.   Marland Kitchen amiodarone (PACERONE) 200 MG tablet Take 1 tablet (200 mg total) by mouth daily.  Marland Kitchen apixaban (ELIQUIS) 5 MG TABS tablet Take 5 mg by mouth 2 (two) times daily.   Marland Kitchen atorvastatin (LIPITOR) 20 MG tablet Take 20 mg by mouth daily at 6 PM.   . Calcium Carbonate-Vitamin D 600-400 MG-UNIT per tablet Take 1 tablet by mouth 3 (three) times a week.   . famotidine (PEPCID) 40 MG tablet Take 40 mg by mouth daily.  . furosemide (LASIX) 20 MG tablet Take 1 tablet (20 mg total) by mouth daily.  Marland Kitchen gabapentin (NEURONTIN) 100 MG capsule Take 100 mg by mouth at bedtime. Take two capsules in the am & three capsules in the pm  . methocarbamol (ROBAXIN) 500 MG tablet Take 500 mg by mouth in the morning and at bedtime.  Marland Kitchen oxybutynin (DITROPAN-XL) 5 MG 24 hr tablet Take 5 mg by mouth in the morning.  . [DISCONTINUED] amLODipine (NORVASC) 5 MG tablet Take 1 tablet (5 mg total) by mouth daily.  . [DISCONTINUED] diltiazem (CARDIZEM CD) 120 MG 24 hr capsule Take 120 mg by mouth daily.  . [DISCONTINUED] losartan (COZAAR) 100 MG tablet Take 100 mg by mouth daily.   . [DISCONTINUED] metoprolol succinate (TOPROL-XL) 50 MG 24 hr tablet Take 1 tablet by mouth daily.  Allergies:   Enalapril   Social History   Socioeconomic History  . Marital status: Married    Spouse name: Not on file  . Number of children: Not on file  . Years of education: Not on file  . Highest education level: Not on file  Occupational History  . Not on file  Tobacco Use  . Smoking status: Never Smoker  . Smokeless tobacco: Never Used  Vaping Use  . Vaping Use: Never used  Substance and Sexual Activity  . Alcohol use: No  . Drug use: No  . Sexual activity: Not on file  Other Topics Concern  . Not on file  Social History Narrative  . Not on file   Social Determinants of Health   Financial Resource Strain:   .  Difficulty of Paying Living Expenses: Not on file  Food Insecurity:   . Worried About Charity fundraiser in the Last Year: Not on file  . Ran Out of Food in the Last Year: Not on file  Transportation Needs:   . Lack of Transportation (Medical): Not on file  . Lack of Transportation (Non-Medical): Not on file  Physical Activity:   . Days of Exercise per Week: Not on file  . Minutes of Exercise per Session: Not on file  Stress:   . Feeling of Stress : Not on file  Social Connections:   . Frequency of Communication with Friends and Family: Not on file  . Frequency of Social Gatherings with Friends and Family: Not on file  . Attends Religious Services: Not on file  . Active Member of Clubs or Organizations: Not on file  . Attends Archivist Meetings: Not on file  . Marital Status: Not on file     Family History: The patient's family history includes Breast cancer in her cousin; Breast cancer (age of onset: 4) in her maternal aunt.  ROS:   Please see the history of present illness.     All other systems reviewed and are negative.  EKGs/Labs/Other Studies Reviewed:    The following studies were reviewed today:   EKG:  EKG is  ordered today.  The ekg ordered today demonstrates sinus bradycardia otherwise normal EKG, heart rate 47  Recent Labs: 02/06/2020: BUN 19; Creatinine, Ser 1.03; Hemoglobin 13.3; Platelets 244; Potassium 4.1; Sodium 139  Recent Lipid Panel No results found for: CHOL, TRIG, HDL, CHOLHDL, VLDL, LDLCALC, LDLDIRECT  Physical Exam:    VS:  BP (!) 142/72   Pulse (!) 52   Ht 5' (1.524 m)   Wt 161 lb 12.8 oz (73.4 kg)   BMI 31.60 kg/m     Wt Readings from Last 3 Encounters:  07/06/20 161 lb 12.8 oz (73.4 kg)  05/01/20 160 lb 6 oz (72.7 kg)  04/03/20 167 lb (75.8 kg)     GEN:  Well nourished, well developed in no acute distress HEENT: Normal NECK: No JVD; No carotid bruits LYMPHATICS: No lymphadenopathy CARDIAC: Bradycardic, regular, no  murmurs, rubs, gallops RESPIRATORY:  Clear to auscultation without rales, wheezing or rhonchi  ABDOMEN: Soft, non-tender, non-distended MUSCULOSKELETAL:  No edema; No deformity  SKIN: Warm and dry NEUROLOGIC:  Alert and oriented x 3 PSYCHIATRIC:  Normal affect   ASSESSMENT:    1. Pre-op evaluation   2. Coronary artery disease involving native coronary artery of native heart without angina pectoris   3. Paroxysmal atrial fibrillation (HCC)   4. Diastolic dysfunction   5. Essential hypertension    PLAN:  In order of problems listed above:  1. Cardiac risk stratification prior to right hip replacement.  Last left heart cath with nonobstructive disease, Myoview with no significant ischemia, patient denies symptoms of chest pain.  Echo with preserved ejection fraction.  Patient can undergo procedure from a cardiac perspective without any additional testing.  Eliquis can be held 48 hours prior to surgical procedure and restarted as soon as possible when safe from a surgical standpoint. 2. CAD, [place] 2009 with moderate stenosis in the first diagonal and RCA.  Currently denies chest pain.  Continue Eliquis and statin.  Echo 09/9560 normal systolic function, EF 55 to 60%.  Grade 2 diastolic dysfunction. 3. paroxysmal atrial fibrillation, currently in sinus brady, heart rate 52.  CHA2DS2-VASc of 4 (age, htn, gender).  Amiodarone 200 mg daily.  Eliquis. 4. Grade 2 diastolic dysfunction on last echo, trace edema, stop amlodipine.  Continue Lasix 20 mg daily. 5. hypertension, blood pressure elevated.  Stop losartan, start irbesartan 300 mg daily.  Consider low-dose diuretic/HCTZ if BP still elevated.  Follow-up in 3 weeks for edema and BP check    Medication Adjustments/Labs and Tests Ordered: Current medicines are reviewed at length with the patient today.  Concerns regarding medicines are outlined above.  Orders Placed This Encounter  Procedures  . EKG 12-Lead   Meds ordered this encounter   Medications  . irbesartan (AVAPRO) 300 MG tablet    Sig: Take 1 tablet (300 mg total) by mouth daily.    Dispense:  90 tablet    Refill:  3  . irbesartan (AVAPRO) 300 MG tablet    Sig: Take 1 tablet (300 mg total) by mouth daily. Refills sent to [facility]    Dispense:  30 tablet    Refill:  0    Patient Instructions  Medication Instructions:   Your physician has recommended you make the following change in your medication:    2.  START taking irbesartan (AVAPRO) 300 MG: Take 1 tablet by mouth daily.  1.  STOP taking:      *If you need a refill on your cardiac medications before your next appointment, please call your pharmacy*   Lab Work: None Ordered If you have labs (blood work) drawn today and your tests are completely normal, you will receive your results only by: Marland Kitchen MyChart Message (if you have MyChart) OR . A paper copy in the mail If you have any lab test that is abnormal or we need to change your treatment, we will call you to review the results.   Testing/Procedures: None Ordered   Follow-Up: At [facility], you and your health needs are our priority.  As part of our continuing mission to provide you with exceptional heart care, we have created designated Provider Care Teams.  These Care Teams include your primary Cardiologist (physician) and Advanced Practice Providers (APPs -  Physician Assistants and Nurse Practitioners) who all work together to provide you with the care you need, when you need it.  We recommend signing up for the patient portal called "MyChart".  Sign up information is provided on this After Visit Summary.  MyChart is used to connect with patients for Virtual Visits (Telemedicine).  Patients are able to view lab/test results, encounter notes, upcoming appointments, etc.  Non-urgent messages can be sent to your provider as well.   To learn more about what you can do with MyChart, go to [email or web address].    Your next appointment:    3 week(s)  The  format for your next appointment:   In Person  Provider:   You may see Kate Sable, MD or one of the following Advanced Practice Providers on your designated Care Team:    Murray Hodgkins, NP  Christell Faith, PA-C  Marrianne Mood, PA-C  Cadence Olivia Lopez de Gutierrez, [place]  Laurann Montana, NP    Other Instructions      Signed, Kate Sable, MD  07/06/2020 12:47 PM    [place]

## 2020-07-16 NOTE — Telephone Encounter (Signed)
Mendel Ryder from [facility] calling to follow up on this clearance.  Return phone is:  [phone] Return fax is: [phone]

## 2020-07-16 NOTE — Telephone Encounter (Signed)
° °  Primary Cardiologist: Kate Sable, MD  Chart reviewed as part of pre-operative protocol coverage. Given past medical history and time since last visit, based on ACC/AHA guidelines, KIRANDEEP FARISS would be at acceptable risk for the planned procedure without further cardiovascular testing.   Cardiac risk stratification prior to right hip replacement.  Last left heart cath with nonobstructive disease, Myoview with no significant ischemia, patient denies symptoms of chest pain.  Echo with preserved ejection fraction.  Patient can undergo procedure from a cardiac perspective without any additional testing.    Eliquis can be held 48 hours prior to surgical procedure and restarted as soon as possible when safe from a surgical standpoint.  I will route this recommendation to the requesting party via Epic fax function and remove from pre-op pool.  Please call with questions.  Jossie Ng. Nashira Mcglynn NP-C    07/16/2020, 9:24 AM [place] [address] Office [phone] Fax [phone]

## 2020-07-27 NOTE — Progress Notes (Signed)
Cardiology Office Note    Date:  08/03/2020   ID:  Tinlee, Sessum 1941/04/15, MRN [id number]  PCP:  Dion Body, MD  Cardiologist:  Kate Sable, MD  Electrophysiologist:  None   Chief Complaint  Patient presents with  . Other    3 week follow up. Patient c/o SOB when doing things meds reviewed verbally with patient.      History of Present Illness:   ADRIKA ZURFLUH is a 79 y.o. female with history of  nonobstructive CAD,  PAF on Eliquis, HFpEF, HTN, and HLD  who presents for follow-up of her hypertension, PAF Patient of Dr. Garen Lah  Prior hx reviewed in detail  Prior LHC in 09/2007 showed nonobstructive CAD with 50% stenosis in D1 and RCA.     ischemic evaluation via [place] MPI in 09/2019 showed mild reversible lateral wall ischemia with an EF of 79%. ischemic evaluation was deferred.    Prior Zio patch revealed less than 1% A. fib burden with the longest episode lasting 2 hours 16 minutes and 23 seconds.    72-hour Holter monitor revealed a predominant rhythm of sinus with an average heart rate of 63 bpm.  PAF was observed with the longest episode lasting 8 hours and 57 minutes with an overall A. fib burden of 12.68%.    lower extremity edema after starting amlodipine.    Recently started on HCTZ Hip surgery in 2 days time cancelled secondary to klebsiella infection On ABX Surgery will be rescheduled  Sedentary, having SOB, deconditioned/sedentary  BP at home [id number] systolic Blood pressure higher in the morning before her medications  Labs independently reviewed: 05/2020 - potassium 4.1, BUN 13, serum creatinine 0.9, AST/ALT normal, albumin 4.2, TC 173, TG 94, HDL 66, LDL 88, Hgb 13.6, PLT 224  Past Medical History:  Diagnosis Date  . (HFpEF) heart failure with preserved ejection fraction ([place])   . A-fib ([place])   . Borderline diabetes   . Breast cancer ([place]) 2015   Left side - radiation - lumpectomy  . CAD (coronary artery disease)   . DCIS  (ductal carcinoma in situ)   . Empty sella syndrome ([place])   . GERD (gastroesophageal reflux disease)   . Gout   . Hyperlipidemia   . Hypertension   . Personal history of radiation therapy   . Skin cancer 2011    Past Surgical History:  Procedure Laterality Date  . ABDOMINAL HYSTERECTOMY    . BREAST BIOPSY Left 2015   positive  . BREAST LUMPECTOMY Left 09/23/13  . CHOLECYSTECTOMY    . GASTRIC FUNDOPLICATION    . left lumpectomy      Current Medications: Current Meds  Medication Sig  . allopurinol (ZYLOPRIM) 300 MG tablet Take 300 mg by mouth daily.   Marland Kitchen amiodarone (PACERONE) 200 MG tablet Take 1 tablet (200 mg total) by mouth daily.  Marland Kitchen apixaban (ELIQUIS) 5 MG TABS tablet Take 5 mg by mouth 2 (two) times daily.   Marland Kitchen atorvastatin (LIPITOR) 20 MG tablet Take 20 mg by mouth daily at 6 PM.   . Calcium Carbonate-Vitamin D 600-400 MG-UNIT per tablet Take 1 tablet by mouth 3 (three) times a week.   . ciprofloxacin (CIPRO) 500 MG tablet Take 500 mg by mouth every 12 (twelve) hours. For 3 days  . famotidine (PEPCID) 40 MG tablet Take 40 mg by mouth at bedtime.  . furosemide (LASIX) 20 MG tablet Take 1 tablet (20 mg total) by mouth daily.  Marland Kitchen gabapentin (  NEURONTIN) 100 MG capsule Take two capsules (200 mg) in the am & three capsules (300 mg) in the pm  . hydrochlorothiazide (MICROZIDE) 12.5 MG capsule Take 1 capsule (12.5 mg total) by mouth daily.  . irbesartan (AVAPRO) 300 MG tablet Take 1 tablet (300 mg total) by mouth daily.  . methocarbamol (ROBAXIN) 500 MG tablet Take 250-500 mg by mouth 2 (two) times daily as needed.  Marland Kitchen oxybutynin (DITROPAN-XL) 5 MG 24 hr tablet Take 5 mg by mouth in the morning.  . [DISCONTINUED] irbesartan (AVAPRO) 300 MG tablet Take 1 tablet (300 mg total) by mouth daily. Refills sent to [facility]    Allergies:   Enalapril and Tylenol [acetaminophen]   Social History   Socioeconomic History  . Marital status: Married    Spouse name: Not on file  . Number of  children: Not on file  . Years of education: Not on file  . Highest education level: Not on file  Occupational History  . Not on file  Tobacco Use  . Smoking status: Never Smoker  . Smokeless tobacco: Never Used  Vaping Use  . Vaping Use: Never used  Substance and Sexual Activity  . Alcohol use: No  . Drug use: No  . Sexual activity: Not on file  Other Topics Concern  . Not on file  Social History Narrative  . Not on file   Social Determinants of Health   Financial Resource Strain: Not on file  Food Insecurity: Not on file  Transportation Needs: Not on file  Physical Activity: Not on file  Stress: Not on file  Social Connections: Not on file     Family History:  The patient's family history includes Breast cancer in her cousin; Breast cancer (age of onset: 79) in her maternal aunt.  ROS:   Review of Systems  Constitutional: Negative.   HENT: Negative.   Respiratory: Positive for shortness of breath.   Cardiovascular: Negative.   Gastrointestinal: Negative.   Musculoskeletal: Negative.   Neurological: Negative.   Psychiatric/Behavioral: Negative.   All other systems reviewed and are negative.   EKGs/Labs/Other Studies Reviewed:    Studies reviewed were summarized above. The additional studies were reviewed today:  2D echo 03/30/2020: 1. Left ventricular ejection fraction, by estimation, is 55 to 60%. The  left ventricle has normal function. The left ventricle has no regional  wall motion abnormalities. There is mild left ventricular hypertrophy.  Left ventricular diastolic parameters  are consistent with Grade II diastolic dysfunction (pseudonormalization).  2. Right ventricular systolic function is normal. The right ventricular  size is normal. There is moderately elevated pulmonary artery systolic  pressure.  3. The mitral valve is normal in structure. Mild to moderate mitral valve  regurgitation. No evidence of mitral stenosis.  4. The aortic valve is  normal in structure. Aortic valve regurgitation is  not visualized. Mild to moderate aortic valve sclerosis/calcification is  present, without any evidence of aortic stenosis.   EKG:    Recent Labs: 07/29/2020: BUN 13; Creatinine, Ser 0.89; Hemoglobin 13.1; Platelets 258; Potassium 3.0; Sodium 139  Recent Lipid Panel No results found for: CHOL, TRIG, HDL, CHOLHDL, VLDL, LDLCALC, LDLDIRECT  PHYSICAL EXAM:    VS:  BP 120/60 (BP Location: Left Arm, Patient Position: Sitting, Cuff Size: Normal)   Pulse (!) 53   Ht 5' (1.524 m)   Wt 161 lb 6 oz (73.2 kg)   SpO2 97%   BMI 31.52 kg/m   BMI: Body mass index is 31.52  kg/m.  Physical Exam  Constitutional:  oriented to person, place, and time. No distress.  HENT:  Head: Grossly normal Eyes:  no discharge. No scleral icterus.  Neck: No JVD, no carotid bruits  Cardiovascular: Regular rate and rhythm, no murmurs appreciated Pulmonary/Chest: Clear to auscultation bilaterally, no wheezes or rails Abdominal: Soft.  no distension.  no tenderness.  Musculoskeletal: Normal range of motion Neurological:  normal muscle tone. Coordination normal. No atrophy Skin: Skin warm and dry Psychiatric: normal affect, pleasant   Wt Readings from Last 3 Encounters:  08/03/20 161 lb 6 oz (73.2 kg)  07/29/20 162 lb (73.5 kg)  07/06/20 161 lb 12.8 oz (73.4 kg)     ASSESSMENT & PLAN:   1. CAD involving the native coronary arteries without angina Currently with no symptoms of angina. No further workup at this time. Continue current medication regimen.  2. PAF: Not on b-blocker, "made heart rate too slow" Continue amiodarone at low-dose 200 mg daily, TSH ordered today Recommend she call us for additional episodes of paroxysmal tachycardia  3. HTN:  Blood pressure is well controlled on today's visit. No changes made to the medications.  4. HFpEF: Euvolemic, labs reviewed Eating bananas Swelling resolved on lasix 20 and HCTZ 12.5  5. HLD:  LDL  88 from 05/2020 with normal LFT at that time.  Preop cardiovascular She would be acceptable risk for hip surgery when this is rescheduled in several weeks time, no further cardiac testing needed She would hold the Eliquis 2 days prior to procedure Recommend she take her amiodarone the morning of the procedure  Disposition: F/u with Dr. Azucena Cecil or an APP in 6 months   Medication Adjustments/Labs and Tests Ordered: Current medicines are reviewed at length with the patient today.  Concerns regarding medicines are outlined above. Medication changes, Labs and Tests ordered today are summarized above and listed in the Patient Instructions accessible in Encounters.   Signed, Signed, Dossie Arbour, MD, Ph.D [facility]

## 2020-07-28 ENCOUNTER — Other Ambulatory Visit: Payer: Medicare Other

## 2020-07-28 ENCOUNTER — Other Ambulatory Visit: Payer: Self-pay | Admitting: Orthopedic Surgery

## 2020-07-28 ENCOUNTER — Encounter: Payer: Self-pay | Admitting: Orthopedic Surgery

## 2020-07-28 NOTE — H&P (Signed)
NAME: Charlene Coleman MRN:   [id number] DOB:   11-Sep-1940     HISTORY AND PHYSICAL  CHIEF COMPLAINT:  Right hip pain  HISTORY:   Charlene Greear Joyceis a 79 y.o. female  with right  Hip Pain Patient complains of right hip pain. Onset of the symptoms was several years ago. Inciting event: known DJD. The patient reports the hip pain is worse with weight bearing. Associated symptoms: none, injury. Aggravating symptoms include: any weight bearing and going up and down stairs. Patient has had no prior hip problems. Previous visits for this problem: yes, last seen several weeks ago by me. Evaluation to date: plain films, which were abnormal  osteoarthritis. Treatment to date: OTC analgesics, which have been somewhat effective, prescription analgesics, which have been somewhat effective, home exercise program, which has been somewhat effective and physical therapy, which has been somewhat effective. Plan for right total hip replacement  PAST MEDICAL HISTORY:   Past Medical History:  Diagnosis Date  . Borderline diabetes   . Breast cancer (HCC) 2015   Left side - radiation - lumpectomy  . CAD (coronary artery disease)   . DCIS (ductal carcinoma in situ)   . Empty sella syndrome (HCC)   . Gout   . Hyperlipidemia   . Hypertension   . Personal history of radiation therapy   . Skin cancer 2011    PAST SURGICAL HISTORY:   Past Surgical History:  Procedure Laterality Date  . ABDOMINAL HYSTERECTOMY    . BREAST BIOPSY Left 2015   positive  . BREAST LUMPECTOMY Left 09/23/13  . CHOLECYSTECTOMY    . GASTRIC FUNDOPLICATION    . left lumpectomy      MEDICATIONS:  (Not in a hospital admission)   ALLERGIES:   Allergies  Allergen Reactions  . Enalapril Cough and Other (See Comments)    REVIEW OF SYSTEMS:   Negative except HPI  FAMILY HISTORY:   Family History  Problem Relation Age of Onset  . Breast cancer Cousin   . Breast cancer Maternal Aunt 82    SOCIAL HISTORY:   reports that she has never  smoked. She has never used smokeless tobacco. She reports that she does not drink alcohol and does not use drugs.  PHYSICAL EXAM:  General appearance: alert, cooperative and no distress Neck: no JVD and supple, symmetrical, trachea midline Resp: clear to auscultation bilaterally Cardio: regular rate and rhythm, S1, S2 normal, no murmur, click, rub or gallop GI: soft, non-tender; bowel sounds normal; no masses,  no organomegaly Extremities: extremities normal, atraumatic, no cyanosis or edema and Homans sign is negative, no sign of DVT Pulses: 2+ and symmetric Skin: Skin color, texture, turgor normal. No rashes or lesions    LABORATORY STUDIES: No results for input(s): WBC, HGB, HCT, PLT in the last 72 hours.  No results for input(s): NA, K, CL, CO2, GLUCOSE, BUN, CREATININE, CALCIUM in the last 72 hours.  STUDIES/RESULTS:  No results found.  ASSESSMENT:  End stage osteoarthritis right hip        Active Problems:   * No active hospital problems. *    PLAN:  Right Primary Total Hip   Altamese Cabal 07/28/2020. 11:44 AM

## 2020-07-29 ENCOUNTER — Other Ambulatory Visit: Payer: Self-pay

## 2020-07-29 ENCOUNTER — Other Ambulatory Visit
Admission: RE | Admit: 2020-07-29 | Discharge: 2020-07-29 | Disposition: A | Discharge status: Home / Self Care | Payer: Medicare Other | Source: Ambulatory Visit | Attending: Orthopedic Surgery | Admitting: Orthopedic Surgery

## 2020-07-29 ENCOUNTER — Encounter: Payer: Self-pay | Admitting: Urgent Care

## 2020-07-29 DIAGNOSIS — Z01812 Encounter for preprocedural laboratory examination: Secondary | ICD-10-CM | POA: Insufficient documentation

## 2020-07-29 HISTORY — DX: Unspecified atrial fibrillation: I48.91

## 2020-07-29 HISTORY — DX: Gastro-esophageal reflux disease without esophagitis: K21.9

## 2020-07-29 LAB — TYPE AND SCREEN
ABO/RH(D): B POS
Antibody Screen: NEGATIVE

## 2020-07-29 LAB — URINALYSIS, ROUTINE W REFLEX MICROSCOPIC
Bilirubin Urine: NEGATIVE
Glucose, UA: NEGATIVE mg/dL
Hgb urine dipstick: NEGATIVE
Ketones, ur: NEGATIVE mg/dL
Nitrite: NEGATIVE
Protein, ur: NEGATIVE mg/dL
Specific Gravity, Urine: 1.008 (ref 1.005–1.030)
pH: 6 (ref 5.0–8.0)

## 2020-07-29 LAB — CBC
HCT: 40.1 % (ref 36.0–46.0)
Hemoglobin: 13.1 g/dL (ref 12.0–15.0)
MCH: 31.1 pg (ref 26.0–34.0)
MCHC: 32.7 g/dL (ref 30.0–36.0)
MCV: 95.2 fL (ref 80.0–100.0)
Platelets: 258 10*3/uL (ref 150–400)
RBC: 4.21 MIL/uL (ref 3.87–5.11)
RDW: 14.9 % (ref 11.5–15.5)
WBC: 7.7 10*3/uL (ref 4.0–10.5)
nRBC: 0 % (ref 0.0–0.2)

## 2020-07-29 LAB — BASIC METABOLIC PANEL
Anion gap: 11 (ref 5–15)
BUN: 13 mg/dL (ref 8–23)
CO2: 27 mmol/L (ref 22–32)
Calcium: 9.1 mg/dL (ref 8.9–10.3)
Chloride: 101 mmol/L (ref 98–111)
Creatinine, Ser: 0.89 mg/dL (ref 0.44–1.00)
GFR, Estimated: >60 mL/min (ref >60)
Glucose, Bld: 76 mg/dL (ref 70–99)
Potassium: 3 mmol/L — ABNORMAL LOW (ref 3.5–5.1)
Sodium: 139 mmol/L (ref 135–145)

## 2020-07-29 LAB — SURGICAL PCR SCREEN
MRSA, PCR: NEGATIVE
Staphylococcus aureus: NEGATIVE

## 2020-07-29 LAB — PROTIME-INR
INR: 1.5 — ABNORMAL HIGH (ref 0.8–1.2)
Prothrombin Time: 17.4 seconds — ABNORMAL HIGH (ref 11.4–15.2)

## 2020-07-29 LAB — APTT: aPTT: 45 seconds — ABNORMAL HIGH (ref 24–36)

## 2020-07-29 NOTE — Patient Instructions (Addendum)
Your procedure is scheduled on: 08/05/20 Report to DAY SURGERY DEPARTMENT LOCATED ON 2ND FLOOR MEDICAL MALL ENTRANCE. You must check in at the Admitting Desk before reporting to Day Surgery. To find out your arrival time please call [phone] between 1PM - 3PM on 08/04/20.  Remember: Instructions that are not followed completely may result in serious medical risk, up to and including death, or upon the discretion of your surgeon and anesthesiologist your surgery may need to be rescheduled.     _X__ 1. Do not eat food after midnight the night before your procedure.                 No gum chewing or hard candies.   __X__2.  On the morning of surgery brush your teeth with toothpaste and water, you                 may rinse your mouth with mouthwash if you wish.  Do not swallow any              toothpaste of mouthwash.     _X__ 3.  No Alcohol for 24 hours before or after surgery.   _X__ 4.  Do Not Smoke or use e-cigarettes For 24 Hours Prior to Your Surgery.                 Do not use any chewable tobacco products for at least 6 hours prior to                 surgery.  ____  5.  Bring all medications with you on the day of surgery if instructed.   __X__  6.  Notify your doctor if there is any change in your medical condition      (cold, fever, infections).     Do not wear jewelry, make-up, hairpins, clips or nail polish. Do not wear lotions, powders, or perfumes.  Do not shave 48 hours prior to surgery. Men may shave face and neck. Do not bring valuables to the hospital.    [facility] is not responsible for any belongings or valuables.  Contacts, dentures/partials or body piercings may not be worn into surgery. Bring a case for your contacts, glasses or hearing aids, a denture cup will be supplied. Leave your suitcase in the car. After surgery it may be brought to your room. For patients admitted to the hospital, discharge time is determined by your treatment team.   Patients  discharged the day of surgery will not be allowed to drive home.   Please read over the following fact sheets that you were given:   MRSA Information  __X__ Take these medicines the morning of surgery with A SIP OF WATER:    1. allopurinol (ZYLOPRIM) 300 MG tablet  2. amiodarone (PACERONE) 200 MG tablet  3. famotidine (PEPCID) 40 MG tablet take at bedtime and again morning of surgery  4. gabapentin (NEURONTIN) 200 MG capsule  5. oxybutynin (DITROPAN-XL) 5 MG 24 hr tablet  6.  ____ Fleet Enema (as directed)   __X__ Use CHG Soap/SAGE wipes as directed  ____ Use inhalers on the day of surgery  ____ Stop metformin/Janumet/Farxiga 2 days prior to surgery    ____ Take 1/2 of usual insulin dose the night before surgery. No insulin the morning          of surgery.   __X__ Stop Blood Thinners Coumadin/Plavix/Xarelto/Pleta/Pradaxa/Eliquis/Effient/Aspirin  STOP ELIQUIS AS PREVIOUSLY INSTRUCTED 2 DAYS PRIOR TO SURGERY  __X__ Stop  Anti-inflammatories 7 days before surgery such as Advil, Ibuprofen, Motrin,  BC or Goodies Powder, Naprosyn, Naproxen, Aleve, Aspirin    __X__ Stop all herbal supplements, fish oil or vitamin E until after surgery.  OK TO KEEP TAKING YOUR CALCIUM  ____ Bring C-Pap to the hospital.

## 2020-07-29 NOTE — Progress Notes (Signed)
  [facility] Perioperative Services: Pre-Admission/Anesthesia Testing  Abnormal Lab Notification   Date: 07/29/20  Name: ASHLYNE OLENICK MRN:   [id number]  Re: Abnormal labs noted during PAT appointment   Provider(s) Notified: Lyndle Herrlich, MD Notification mode: Routed and/or faxed via [facility]   ABNORMAL LAB VALUE(S): Lab Results  Component Value Date   K 3.0 (L) 07/29/2020   Notes:  Patient is scheduled for a TOTAL HIP ARTHROPLASTY (Right Hip) on 08/05/2020. Patient is on daily furosemide. Will send to surgeon for review and optimization. Will add order to have level rechecked on the day of surgery to ensure derangement correction. This is a Personal assistant; no formal response is required.  Quentin Mulling, MSN, APRN, FNP-C, CEN [facility]  Peri-operative Services Nurse Practitioner Phone: [phone] Fax: [phone] 07/29/20 5:03 PM

## 2020-07-29 NOTE — Progress Notes (Addendum)
  [facility] Perioperative Services: Pre-Admission/Anesthesia Testing  Abnormal Lab Notification   Date: 07/29/20  Name: Charlene Coleman MRN:   [id number]  Re: Abnormal labs noted during PAT appointment   Provider(s) Notified: Lyndle Herrlich, MD and Altamese Cabal, PA-C Notification mode: Routed and/or faxed via [facility]   ABNORMAL LAB VALUE(S): Lab Results  Component Value Date   COLORURINE YELLOW (A) 07/29/2020   APPEARANCEUR CLEAR (A) 07/29/2020   LABSPEC 1.008 07/29/2020   PHURINE 6.0 07/29/2020   GLUCOSEU NEGATIVE 07/29/2020   HGBUR NEGATIVE 07/29/2020   BILIRUBINUR NEGATIVE 07/29/2020   KETONESUR NEGATIVE 07/29/2020   PROTEINUR NEGATIVE 07/29/2020   NITRITE NEGATIVE 07/29/2020   LEUKOCYTESUR SMALL (A) 07/29/2020   EPIU 0-5 07/29/2020   WBCU 11-20 07/29/2020   RBCU 0-5 07/29/2020   BACTERIA MANY (A) 07/29/2020   Notes:  Patient scheduled for TOTAL HIP ARTHROPLASTY (Right Hip) on 08/05/2020.    UA performed in PAT concerning for infection.  . No leukocytosis noted on CBC . Renal function normal. Estimated Creatinine Clearance: 46.6 mL/min (by C-G formula based on SCr of 0.89 mg/dL). . Urine C&S added to assess for pathogenically significant growth. Will forward UA, and subsequent C&S results, to attending surgeon for review and Tx as deemed appropriate. This is a Personal assistant; no formal response is required.  Quentin Mulling, MSN, APRN, FNP-C, CEN [facility]  Peri-operative Services Nurse Practitioner Phone: [phone] Fax: [phone] 07/29/20 5:00 PM

## 2020-07-30 ENCOUNTER — Encounter: Payer: Self-pay | Admitting: Orthopedic Surgery

## 2020-07-30 ENCOUNTER — Telehealth: Payer: Self-pay | Admitting: Cardiology

## 2020-07-30 MED ORDER — HYDROCHLOROTHIAZIDE 12.5 MG PO CAPS: 12.5000 mg | ORAL_CAPSULE | Freq: Every day | ORAL | 3 refills | Status: DC

## 2020-07-30 NOTE — Progress Notes (Signed)
[facility] Perioperative Services  Pre-Admission/Anesthesia Testing Clinical Review  Date: 07/30/20  Patient Demographics:  Name: Charlene Coleman DOB:   [birth year] MRN:   [id number]  Planned Surgical Procedure(s):    Case: [id number] Date/Time: 08/05/20 1037   Procedure: TOTAL HIP ARTHROPLASTY (Right Hip)   Anesthesia type: Spinal   Pre-op diagnosis: M16.11 Unilateral primary osteoarthritis, right hip   Location: [facility] OR ROOM 02 / [place] ORS FOR ANESTHESIA GROUP   Surgeons: Lovell Sheehan, MD    NOTE: Available PAT nursing documentation and vital signs have been reviewed. Clinical nursing staff has updated patient's PMH/PSHx, current medication list, and drug allergies/intolerances to ensure comprehensive history available to assist in medical decision making as it pertains to the aforementioned surgical procedure and anticipated anesthetic course.   Clinical Discussion:  Charlene Coleman is a 79 y.o. female who is submitted for pre-surgical anesthesia review and clearance prior to her undergoing the above procedure. Patient has never been a smoker. Pertinent PMH includes: CAD, HFpEF, atrial fibrillation, HTN, HLD, borderline diabetes, GERD (on H2 blocker), breast cancer, empty sella syndrome.  Patient is followed by cardiology Garen Lah, MD). She was last seen in the cardiology clinic on 07/06/20; notes reviewed.  At the time of her clinic visit, patient doing well overall from a cardiovascular perspective.  She denied any chest pain, shortness of breath, PND, orthopnea, palpitations, vertiginous symptoms, or presyncope/syncope.  Patient with chronic peripheral edema that is at baseline.  Patient has a PMH (+) for cardiovascular issues.  Patient underwent diagnostic left heart catheterization in 09/2007 that revealed 50% in D 1 and RCA .  Patient also has PAF; rate controlled and NSR maintained using amiodarone.  CHA2DS2-VASc Score = 4.  Patient is on chronic anticoagulation  using daily apixaban; compliant with therapy with no evidence of gastrointestinal bleeding.  Myocardial perfusion imaging study performed on 10/01/2019 revealed normal left ventricular function; no regional wall motion abnormalities, and evidence of mild lateral ischemia; LVEF 79%.  Last TTE performed on 03/30/2020 revealed normal left ventricular systolic function with mild to moderate valvular insufficiency; LVEF 55-60%. G 2 DD parameters met (see full interpretation of cardiovascular test below).  Blood pressure moderately controlled at 142/72 on currently prescribed diuretic and ARB therapy; losartan changed to irbesartan during this visit.  Patient is on a statin for her HLD.  ECG in the revealed sinus bradycardia at a rate of 47 bpm.  No other changes were made to patient's medication regimen.  Patient to follow-up with outpatient cardiology in 3 weeks for ongoing evaluation and management.  Patient is scheduled to undergo an elective orthopedic procedure on 08/05/2020 with Dr. Kurtis Bushman.  Given patient's past medical history significant for cardiovascular issues, presurgical cardiac and internal medicine clearances were sought by the performing surgeon's office and PAT team.  Clearances from the aforementioned specialities were issued as follows:   Per internal medicine, "patient is optimized for surgery with an overall MODERATE risk due to her blood pressure". MD asking that blood pressure be closely monitored and home medication reconciliation be updated/performed.    Per cardiology, "last heart cath with nonobstructive disease, Myoview with no significant ischemia, patient denies symptoms of chest pain, echo with preserved EF.  Patient can undergo procedure from a cardiac perspective without any additional testing at this time".  Again, this patient is on daily anticoagulation therapy.  She has been instructed on recommendations from cardiology for holding her apixaban for 48 hours prior to  procedure with plans  to restart as soon as possible as deemed safe by the primary attending surgeon.  Patient has been instructed that her last dose of apixaban will be on 08/02/2020  She denies previous perioperative complications with anesthesia.   Vitals with BMI 07/29/2020 07/06/2020 05/01/2020  Height _0  _1  -  Weight 162 lbs 161 lbs 13 oz -  BMI 87.68 11.5 -  Systolic 726 203 559  Diastolic 70 72 84  Pulse 50 52 -    Providers/Specialists:   NOTE: Primary physician provider listed below. Patient may have been seen by APP or partner within same practice.   PROVIDER ROLE / SPECIALTY LAST OV  Lovell Sheehan, MD Orthopedics (Surgeon)  06/23/2020  Dion Body, MD Primary Care Provider  07/28/2020  Kate Sable, MD Cardiology  07/06/2020   Allergies:  Enalapril and Tylenol [acetaminophen]  Current Home Medications:   No current facility-administered medications for this encounter.   Marland Kitchen allopurinol (ZYLOPRIM) 300 MG tablet  . amiodarone (PACERONE) 200 MG tablet  . apixaban (ELIQUIS) 5 MG TABS tablet  . atorvastatin (LIPITOR) 20 MG tablet  . Calcium Carbonate-Vitamin D 600-400 MG-UNIT per tablet  . famotidine (PEPCID) 40 MG tablet  . furosemide (LASIX) 20 MG tablet  . gabapentin (NEURONTIN) 100 MG capsule  . irbesartan (AVAPRO) 300 MG tablet  . irbesartan (AVAPRO) 300 MG tablet  . methocarbamol (ROBAXIN) 500 MG tablet  . oxybutynin (DITROPAN-XL) 5 MG 24 hr tablet   History:   Past Medical History:  Diagnosis Date  . (HFpEF) heart failure with preserved ejection fraction ([place])   . A-fib ([place])   . Borderline diabetes   . Breast cancer ([place]) 2015   Left side - radiation - lumpectomy  . CAD (coronary artery disease)   . DCIS (ductal carcinoma in situ)   . Empty sella syndrome ([place])   . GERD (gastroesophageal reflux disease)   . Gout   . Hyperlipidemia   . Hypertension   . Personal history of radiation therapy   . Skin cancer 2011   Past Surgical  History:  Procedure Laterality Date  . ABDOMINAL HYSTERECTOMY    . BREAST BIOPSY Left 2015   positive  . BREAST LUMPECTOMY Left 09/23/13  . CHOLECYSTECTOMY    . GASTRIC FUNDOPLICATION    . left lumpectomy     Family History  Problem Relation Age of Onset  . Breast cancer Cousin   . Breast cancer Maternal Aunt 20   Social History   Tobacco Use  . Smoking status: Never Smoker  . Smokeless tobacco: Never Used  Vaping Use  . Vaping Use: Never used  Substance Use Topics  . Alcohol use: No  . Drug use: No    Pertinent Clinical Results:  LABS:    K+ low - surgeon's office notified for optimization. Will have SD'S staff recheck on the day of surgery to ensure correction of noted derangement.   Hospital Outpatient Visit on 07/29/2020  Component Date Value Ref Range Status  . WBC 07/29/2020 7.7  4.0 - 10.5 K/uL Final  . RBC 07/29/2020 4.21  3.87 - 5.11 MIL/uL Final  . Hemoglobin 07/29/2020 13.1  12.0 - 15.0 g/dL Final  . HCT 07/29/2020 40.1  36.0 - 46.0 % Final  . MCV 07/29/2020 95.2  80.0 - 100.0 fL Final  . MCH 07/29/2020 31.1  26.0 - 34.0 pg Final  . MCHC 07/29/2020 32.7  30.0 - 36.0 g/dL Final  . RDW 07/29/2020 14.9  11.5 - 15.5 % Final  .  Platelets 07/29/2020 258  150 - 400 K/uL Final  . nRBC 07/29/2020 0.0  0.0 - 0.2 % Final   Performed at [facility], [address] [place], [place] [address]  . Sodium 07/29/2020 139  135 - 145 mmol/L Final  . Potassium 07/29/2020 3.0* 3.5 - 5.1 mmol/L Final  . Chloride 07/29/2020 101  98 - 111 mmol/L Final  . CO2 07/29/2020 27  22 - 32 mmol/L Final  . Glucose, Bld 07/29/2020 76  70 - 99 mg/dL Final   Glucose reference range applies only to samples taken after fasting for at least 8 hours.  . BUN 07/29/2020 13  8 - 23 mg/dL Final  . Creatinine, Ser 07/29/2020 0.89  0.44 - 1.00 mg/dL Final  . Calcium 07/29/2020 9.1  8.9 - 10.3 mg/dL Final  . GFR, Estimated 07/29/2020 >60  >60 mL/min Final   Comment: (NOTE) Calculated  using the CKD-EPI Creatinine Equation (2021)   . Anion gap 07/29/2020 11  5 - 15 Final   Performed at [facility], [address] [place], [place] [address]  . Prothrombin Time 07/29/2020 17.4* 11.4 - 15.2 seconds Final  . INR 07/29/2020 1.5* 0.8 - 1.2 Final   Comment: (NOTE) INR goal varies based on device and disease states. Performed at [facility], [address], [place], [place] [address]   . aPTT 07/29/2020 45* 24 - 36 seconds Final   Comment:        IF BASELINE aPTT IS ELEVATED, SUGGEST PATIENT RISK ASSESSMENT BE USED TO DETERMINE APPROPRIATE ANTICOAGULANT THERAPY. Performed at [facility], [address] [place], [place] [address]   . Color, Urine 07/29/2020 YELLOW* YELLOW Final  . APPearance 07/29/2020 CLEAR* CLEAR Final  . Specific Gravity, Urine 07/29/2020 1.008  1.005 - 1.030 Final  . pH 07/29/2020 6.0  5.0 - 8.0 Final  . Glucose, UA 07/29/2020 NEGATIVE  NEGATIVE mg/dL Final  . Hgb urine dipstick 07/29/2020 NEGATIVE  NEGATIVE Final  . Bilirubin Urine 07/29/2020 NEGATIVE  NEGATIVE Final  . Ketones, ur 07/29/2020 NEGATIVE  NEGATIVE mg/dL Final  . Protein, ur 07/29/2020 NEGATIVE  NEGATIVE mg/dL Final  . Nitrite 07/29/2020 NEGATIVE  NEGATIVE Final  . Chalmers Guest 07/29/2020 SMALL* NEGATIVE Final  . RBC / HPF 07/29/2020 0-5  0 - 5 RBC/hpf Final  . WBC, UA 07/29/2020 11-20  0 - 5 WBC/hpf Final  . Bacteria, UA 07/29/2020 MANY* NONE SEEN Final  . Squamous Epithelial / LPF 07/29/2020 0-5  0 - 5 Final   Performed at [facility], [address], [place], [place] [address]  . ABO/RH(D) 07/29/2020 B POS   Final  . Antibody Screen 07/29/2020 NEG   Final  . Sample Expiration 07/29/2020 08/12/2020,2359   Final  . Extend sample reason 07/29/2020    Final                   Value:NO TRANSFUSIONS OR PREGNANCY IN THE PAST 3 MONTHS Performed at [facility], [address] [place], [place] [address]   . MRSA, PCR 07/29/2020  NEGATIVE  NEGATIVE Final  . Staphylococcus aureus 07/29/2020 NEGATIVE  NEGATIVE Final   Comment: (NOTE) The Xpert SA Assay (FDA approved for NASAL specimens in patients 59 years of age and older), is one component of a comprehensive surveillance program. It is not intended to diagnose infection nor to guide or monitor treatment. Performed at [facility], [address], [place],  [address]     ECG:  Date: 07/06/2020 Time ECG obtained: 1059 AM Rate: 52 bpm Rhythm: sinus bradycardia Axis (leads I and aVF): Normal Intervals: PR 204 ms. QRS 90 ms. QTc 455 ms. ST segment and T wave changes: Non specific T wave abnormality; no acute elevation or depression Comparison: Similar to previous tracing obtained on 05/01/2020   IMAGING / PROCEDURES: ECHOCARDIOGRAM performed on 03/30/2020 1. Left ventricular ejection fraction, by estimation, is 55 to 60%. The left ventricle has normal function. The left ventricle has no regional wall motion abnormalities. There is mild left ventricular hypertrophy. Left ventricular diastolic parameters are consistent with Grade II diastolic dysfunction (pseudo normalization).  2. Right ventricular systolic function is normal. The right ventricular size is normal. There is moderately elevated pulmonary artery systolic pressure.  3. The mitral valve is normal in structure. Mild to moderate mitral valve regurgitation. No evidence of mitral stenosis.  4. The aortic valve is normal in structure. Aortic valve regurgitation is not visualized. Mild to moderate aortic valve sclerosis/calcification is present, without any evidence of aortic stenosis.  LEXISCAN performed on 10/01/2019 1. LVEF 79% 2. Regional wall motion reveals normal myocardial thickening and wall motion 3. Left ventricular cavity size is normal 4. Small perfusion abnormality of mild intensity present in the lateral region on stress imaging 5. No artifacts noted 6. The overall quality of  the study is good  Impression and Plan:  Charlene Coleman has been referred for pre-anesthesia review and clearance prior to her undergoing the planned anesthetic and procedural courses. Available labs, pertinent testing, and imaging results were personally reviewed by me. This patient has been appropriately cleared by internal medicine Andrey Cota, MD) and cardiology Garen Lah, MD).   Based on clinical review performed today (07/30/20), barring any significant acute changes in the patient's overall condition, it is anticipated that she will be able to proceed with the planned surgical intervention. Any acute changes in clinical condition may necessitate her procedure being postponed and/or cancelled. Pre-surgical instructions were reviewed with the patient during her PAT appointment and questions were fielded by PAT clinical staff.  Honor Loh, MSN, APRN, FNP-C, CEN [facility]  Peri-operative Services Nurse Practitioner Phone: [phone] 07/30/20 9:40 AM  NOTE: This note has been prepared using Dragon dictation software. Despite my best ability to proofread, there is always the potential that unintentional transcriptional errors may still occur from this process.

## 2020-07-30 NOTE — Telephone Encounter (Signed)
Pt c/o BP issue: STAT if pt c/o blurred vision, one-sided weakness or slurred speech  1. What are your last 5 BP readings?  Not specific readings, but has ranged 175-180 Last night 147/77 This morning 173/85, later 182/82 ., after medication 156/78  2. Are you having any other symptoms (ex. Dizziness, headache, blurred vision, passed out)? Did not sleep well, headache this morning, just not feeling well.   3. What is your BP issue? elevated

## 2020-07-30 NOTE — Telephone Encounter (Signed)
Spoke with Dr. Azucena Cecil and he recommended that the patient start HCTZ 12.5 MG one tab daily. I called patient and relayed the recommendation. Patient verbalized understanding and agreed with plan.  Prescription sent to patients pharmacy.

## 2020-07-31 LAB — URINE CULTURE: Culture: >=100000 — AB

## 2020-08-03 ENCOUNTER — Ambulatory Visit: Payer: Medicare Other | Admitting: Physician Assistant

## 2020-08-03 ENCOUNTER — Encounter: Payer: Self-pay | Admitting: Cardiovascular Disease

## 2020-08-03 ENCOUNTER — Other Ambulatory Visit
Admission: RE | Admit: 2020-08-03 | Discharge: 2020-08-03 | Disposition: A | Discharge status: Home / Self Care | Payer: Medicare HMO | Source: Ambulatory Visit | Attending: Orthopedic Surgery | Admitting: Orthopedic Surgery

## 2020-08-03 ENCOUNTER — Ambulatory Visit: Payer: Medicare HMO | Admitting: Cardiovascular Disease

## 2020-08-03 ENCOUNTER — Other Ambulatory Visit: Payer: Self-pay

## 2020-08-03 VITALS — BP 120/60 | HR 53 | Ht 60.0 in | Wt 161.4 lb

## 2020-08-03 DIAGNOSIS — I5189 Other ill-defined heart diseases: Secondary | ICD-10-CM

## 2020-08-03 DIAGNOSIS — I10 Essential (primary) hypertension: Secondary | ICD-10-CM

## 2020-08-03 DIAGNOSIS — R0602 Shortness of breath: Secondary | ICD-10-CM | POA: Diagnosis not present

## 2020-08-03 DIAGNOSIS — I480 Paroxysmal atrial fibrillation: Secondary | ICD-10-CM | POA: Diagnosis not present

## 2020-08-03 DIAGNOSIS — Z20822 Contact with and (suspected) exposure to covid-19: Secondary | ICD-10-CM | POA: Insufficient documentation

## 2020-08-03 DIAGNOSIS — Z01818 Encounter for other preprocedural examination: Secondary | ICD-10-CM | POA: Insufficient documentation

## 2020-08-03 DIAGNOSIS — I2510 Atherosclerotic heart disease of native coronary artery without angina pectoris: Secondary | ICD-10-CM

## 2020-08-03 DIAGNOSIS — E079 Disorder of thyroid, unspecified: Secondary | ICD-10-CM | POA: Diagnosis not present

## 2020-08-03 NOTE — Patient Instructions (Addendum)
Medication Instructions:  No changes  If you need a refill on your cardiac medications before your next appointment, please call your pharmacy.    Lab work: TSH   You had labs (blood work) drawn today and your tests are completely normal, you will receive your results only by: Marland Kitchen MyChart Message (if you have MyChart) OR . A paper copy in the mail If you have any lab test that is abnormal or we need to change your treatment, we will call you to review the results.   Testing/Procedures: No new testing needed   Follow-Up: At [facility], you and your health needs are our priority.  As part of our continuing mission to provide you with exceptional heart care, we have created designated Provider Care Teams.  These Care Teams include your primary Cardiologist (physician) and Advanced Practice Providers (APPs -  Physician Assistants and Nurse Practitioners) who all work together to provide you with the care you need, when you need it.  . You will need a follow up appointment   F/u with Dr. Azucena Cecil or an APP in 6 months   . Providers on your designated Care Team:   . Nicolasa Ducking, NP . Eula Listen, PA-C . Marisue Ivan, PA-C  Any Other Special Instructions Will Be Listed Below (If Applicable).  COVID-19 Vaccine Information can be found at: [email or web address] For questions related to vaccine distribution or appointments, please email [email or web address] or call [phone].   \

## 2020-08-04 LAB — SARS CORONAVIRUS 2 (TAT 6-24 HRS): SARS Coronavirus 2: NEGATIVE

## 2020-08-04 LAB — TSH: TSH: 15 u[IU]/mL — ABNORMAL HIGH (ref 0.450–4.500)

## 2020-08-10 ENCOUNTER — Telehealth: Payer: Self-pay

## 2020-08-10 MED ORDER — METOPROLOL SUCCINATE ER 25 MG PO TB24: 25.0000 mg | ORAL_TABLET | Freq: Every day | ORAL | 3 refills | Status: DC

## 2020-08-10 MED ORDER — HYDROCHLOROTHIAZIDE 12.5 MG PO CAPS: 12.5000 mg | ORAL_CAPSULE | Freq: Every day | ORAL | 1 refills | Status: DC

## 2020-08-10 NOTE — Telephone Encounter (Signed)
Called patient and informed her og the result note from Dr. Garen Lah as copied and pasted below. Also faxed the TSH results to her PCP as requested. Patient verbalized understanding and agreed with plan.

## 2020-08-12 ENCOUNTER — Encounter
Admission: RE | Admit: 2020-08-12 | Discharge: 2020-08-12 | Disposition: A | Discharge status: Home / Self Care | Payer: Medicare HMO | Source: Ambulatory Visit | Attending: Orthopedic Surgery | Admitting: Orthopedic Surgery

## 2020-08-12 ENCOUNTER — Other Ambulatory Visit: Payer: Self-pay

## 2020-08-12 ENCOUNTER — Other Ambulatory Visit
Admission: RE | Admit: 2020-08-12 | Discharge: 2020-08-12 | Disposition: A | Discharge status: Home / Self Care | Payer: Medicare HMO | Source: Ambulatory Visit | Attending: Orthopedic Surgery | Admitting: Orthopedic Surgery

## 2020-08-12 DIAGNOSIS — Z8619 Personal history of other infectious and parasitic diseases: Secondary | ICD-10-CM | POA: Insufficient documentation

## 2020-08-12 DIAGNOSIS — Z01812 Encounter for preprocedural laboratory examination: Secondary | ICD-10-CM | POA: Diagnosis not present

## 2020-08-12 LAB — TYPE AND SCREEN
ABO/RH(D): B POS
Antibody Screen: NEGATIVE

## 2020-08-12 LAB — BASIC METABOLIC PANEL
Anion gap: 12 (ref 5–15)
BUN: 18 mg/dL (ref 8–23)
CO2: 27 mmol/L (ref 22–32)
Calcium: 9.7 mg/dL (ref 8.9–10.3)
Chloride: 100 mmol/L (ref 98–111)
Creatinine, Ser: 1.15 mg/dL — ABNORMAL HIGH (ref 0.44–1.00)
GFR, Estimated: 49 mL/min — ABNORMAL LOW (ref >60)
Glucose, Bld: 90 mg/dL (ref 70–99)
Potassium: 3.6 mmol/L (ref 3.5–5.1)
Sodium: 139 mmol/L (ref 135–145)

## 2020-08-12 LAB — URINALYSIS, ROUTINE W REFLEX MICROSCOPIC
Bacteria, UA: NONE SEEN
Bilirubin Urine: NEGATIVE
Glucose, UA: NEGATIVE mg/dL
Hgb urine dipstick: NEGATIVE
Ketones, ur: NEGATIVE mg/dL
Nitrite: NEGATIVE
Protein, ur: NEGATIVE mg/dL
Specific Gravity, Urine: 1.004 — ABNORMAL LOW (ref 1.005–1.030)
pH: 7 (ref 5.0–8.0)

## 2020-08-12 NOTE — Pre-Procedure Instructions (Signed)
Patient had PAT interview 07/29/20. Patient verbalized that she still has instructions from 12/29/2 and can follow them. Advised that she does not need to come in for PAT, she stated that was informed that her urine has to be checked before the surgery. Will come in for urine and T & S.

## 2020-08-13 ENCOUNTER — Other Ambulatory Visit: Payer: Medicare HMO

## 2020-08-13 LAB — URINE CULTURE

## 2020-08-13 NOTE — Pre-Procedure Instructions (Signed)
Patient arrived as scheduled for her preop covid appointment but her upcoming surgery has been cancelled.  Notified Dr. Harlow Mares' office that she will need to be contacted regarding her surgery and if she is still on the schedule or not.

## 2020-08-17 ENCOUNTER — Ambulatory Visit: Admission: RE | Admit: 2020-08-17 | Payer: Medicare HMO | Source: Ambulatory Visit | Admitting: Orthopedic Surgery

## 2020-08-17 HISTORY — DX: Unspecified diastolic (congestive) heart failure: I50.30

## 2020-08-17 SURGERY — ARTHROPLASTY, HIP, TOTAL,POSTERIOR APPROACH
Anesthesia: Spinal | Site: Hip | Laterality: Right

## 2020-08-24 MED ORDER — APIXABAN 5 MG PO TABS: 5.0000 mg | ORAL_TABLET | Freq: Two times a day (BID) | ORAL | 2 refills | Status: DC

## 2020-08-24 NOTE — Addendum Note (Signed)
Addended by: Annia Belt on: 08/24/2020 09:39 AM   Modules accepted: Orders

## 2020-08-31 ENCOUNTER — Other Ambulatory Visit: Payer: Self-pay | Admitting: Family Medicine

## 2020-08-31 DIAGNOSIS — Z1231 Encounter for screening mammogram for malignant neoplasm of breast: Secondary | ICD-10-CM

## 2020-09-14 ENCOUNTER — Ambulatory Visit
Admission: RE | Admit: 2020-09-14 | Discharge: 2020-09-14 | Disposition: A | Discharge status: Home / Self Care | Payer: Medicare HMO | Source: Ambulatory Visit | Attending: Family Medicine | Admitting: Family Medicine

## 2020-09-14 ENCOUNTER — Other Ambulatory Visit: Payer: Self-pay

## 2020-09-14 DIAGNOSIS — R946 Abnormal results of thyroid function studies: Secondary | ICD-10-CM | POA: Diagnosis not present

## 2020-09-14 DIAGNOSIS — E039 Hypothyroidism, unspecified: Secondary | ICD-10-CM | POA: Insufficient documentation

## 2020-09-14 DIAGNOSIS — Z1231 Encounter for screening mammogram for malignant neoplasm of breast: Secondary | ICD-10-CM | POA: Insufficient documentation

## 2020-09-14 DIAGNOSIS — Z79899 Other long term (current) drug therapy: Secondary | ICD-10-CM | POA: Diagnosis not present

## 2020-09-16 DIAGNOSIS — Z01812 Encounter for preprocedural laboratory examination: Secondary | ICD-10-CM | POA: Diagnosis not present

## 2020-09-16 DIAGNOSIS — M1611 Unilateral primary osteoarthritis, right hip: Secondary | ICD-10-CM | POA: Diagnosis not present

## 2020-09-17 ENCOUNTER — Other Ambulatory Visit: Payer: Self-pay | Admitting: *Deleted

## 2020-09-17 DIAGNOSIS — I110 Hypertensive heart disease with heart failure: Secondary | ICD-10-CM | POA: Diagnosis not present

## 2020-09-17 DIAGNOSIS — I5030 Unspecified diastolic (congestive) heart failure: Secondary | ICD-10-CM | POA: Diagnosis not present

## 2020-09-17 DIAGNOSIS — M1611 Unilateral primary osteoarthritis, right hip: Secondary | ICD-10-CM | POA: Diagnosis not present

## 2020-09-17 DIAGNOSIS — I480 Paroxysmal atrial fibrillation: Secondary | ICD-10-CM | POA: Diagnosis not present

## 2020-09-17 DIAGNOSIS — I2510 Atherosclerotic heart disease of native coronary artery without angina pectoris: Secondary | ICD-10-CM | POA: Diagnosis not present

## 2020-09-17 DIAGNOSIS — I4891 Unspecified atrial fibrillation: Secondary | ICD-10-CM | POA: Diagnosis not present

## 2020-09-17 DIAGNOSIS — E785 Hyperlipidemia, unspecified: Secondary | ICD-10-CM | POA: Diagnosis not present

## 2020-09-17 DIAGNOSIS — K219 Gastro-esophageal reflux disease without esophagitis: Secondary | ICD-10-CM | POA: Diagnosis not present

## 2020-09-17 DIAGNOSIS — M109 Gout, unspecified: Secondary | ICD-10-CM | POA: Diagnosis not present

## 2020-09-17 MED ORDER — FUROSEMIDE 20 MG PO TABS: 20.0000 mg | ORAL_TABLET | Freq: Every day | ORAL | 0 refills | Status: DC

## 2020-09-18 DIAGNOSIS — I2510 Atherosclerotic heart disease of native coronary artery without angina pectoris: Secondary | ICD-10-CM | POA: Diagnosis not present

## 2020-09-18 DIAGNOSIS — M1611 Unilateral primary osteoarthritis, right hip: Secondary | ICD-10-CM | POA: Diagnosis not present

## 2020-09-18 DIAGNOSIS — I480 Paroxysmal atrial fibrillation: Secondary | ICD-10-CM | POA: Diagnosis not present

## 2020-09-18 DIAGNOSIS — E785 Hyperlipidemia, unspecified: Secondary | ICD-10-CM | POA: Diagnosis not present

## 2020-09-18 DIAGNOSIS — I110 Hypertensive heart disease with heart failure: Secondary | ICD-10-CM | POA: Diagnosis not present

## 2020-09-18 DIAGNOSIS — M109 Gout, unspecified: Secondary | ICD-10-CM | POA: Diagnosis not present

## 2020-09-18 DIAGNOSIS — K219 Gastro-esophageal reflux disease without esophagitis: Secondary | ICD-10-CM | POA: Diagnosis not present

## 2020-09-18 DIAGNOSIS — I5030 Unspecified diastolic (congestive) heart failure: Secondary | ICD-10-CM | POA: Diagnosis not present

## 2020-09-18 DIAGNOSIS — I4891 Unspecified atrial fibrillation: Secondary | ICD-10-CM | POA: Diagnosis not present

## 2020-09-22 DIAGNOSIS — M109 Gout, unspecified: Secondary | ICD-10-CM | POA: Diagnosis not present

## 2020-09-22 DIAGNOSIS — Z471 Aftercare following joint replacement surgery: Secondary | ICD-10-CM | POA: Diagnosis not present

## 2020-09-22 DIAGNOSIS — M810 Age-related osteoporosis without current pathological fracture: Secondary | ICD-10-CM | POA: Diagnosis not present

## 2020-09-22 DIAGNOSIS — R32 Unspecified urinary incontinence: Secondary | ICD-10-CM | POA: Diagnosis not present

## 2020-09-22 DIAGNOSIS — Z96641 Presence of right artificial hip joint: Secondary | ICD-10-CM | POA: Diagnosis not present

## 2020-09-22 DIAGNOSIS — K219 Gastro-esophageal reflux disease without esophagitis: Secondary | ICD-10-CM | POA: Diagnosis not present

## 2020-09-22 DIAGNOSIS — I480 Paroxysmal atrial fibrillation: Secondary | ICD-10-CM | POA: Diagnosis not present

## 2020-09-22 DIAGNOSIS — E785 Hyperlipidemia, unspecified: Secondary | ICD-10-CM | POA: Diagnosis not present

## 2020-09-22 DIAGNOSIS — I10 Essential (primary) hypertension: Secondary | ICD-10-CM | POA: Diagnosis not present

## 2020-09-22 HISTORY — PX: JOINT REPLACEMENT: SHX530

## 2020-09-25 DIAGNOSIS — Z96641 Presence of right artificial hip joint: Secondary | ICD-10-CM | POA: Diagnosis not present

## 2020-09-25 DIAGNOSIS — I10 Essential (primary) hypertension: Secondary | ICD-10-CM | POA: Diagnosis not present

## 2020-09-25 DIAGNOSIS — K219 Gastro-esophageal reflux disease without esophagitis: Secondary | ICD-10-CM | POA: Diagnosis not present

## 2020-09-25 DIAGNOSIS — I480 Paroxysmal atrial fibrillation: Secondary | ICD-10-CM | POA: Diagnosis not present

## 2020-09-25 DIAGNOSIS — R32 Unspecified urinary incontinence: Secondary | ICD-10-CM | POA: Diagnosis not present

## 2020-09-25 DIAGNOSIS — E785 Hyperlipidemia, unspecified: Secondary | ICD-10-CM | POA: Diagnosis not present

## 2020-09-25 DIAGNOSIS — M810 Age-related osteoporosis without current pathological fracture: Secondary | ICD-10-CM | POA: Diagnosis not present

## 2020-09-25 DIAGNOSIS — M109 Gout, unspecified: Secondary | ICD-10-CM | POA: Diagnosis not present

## 2020-09-25 DIAGNOSIS — Z471 Aftercare following joint replacement surgery: Secondary | ICD-10-CM | POA: Diagnosis not present

## 2020-09-28 DIAGNOSIS — R32 Unspecified urinary incontinence: Secondary | ICD-10-CM | POA: Diagnosis not present

## 2020-09-28 DIAGNOSIS — M109 Gout, unspecified: Secondary | ICD-10-CM | POA: Diagnosis not present

## 2020-09-28 DIAGNOSIS — I10 Essential (primary) hypertension: Secondary | ICD-10-CM | POA: Diagnosis not present

## 2020-09-28 DIAGNOSIS — I480 Paroxysmal atrial fibrillation: Secondary | ICD-10-CM | POA: Diagnosis not present

## 2020-09-28 DIAGNOSIS — K219 Gastro-esophageal reflux disease without esophagitis: Secondary | ICD-10-CM | POA: Diagnosis not present

## 2020-09-28 DIAGNOSIS — Z96641 Presence of right artificial hip joint: Secondary | ICD-10-CM | POA: Diagnosis not present

## 2020-09-28 DIAGNOSIS — Z471 Aftercare following joint replacement surgery: Secondary | ICD-10-CM | POA: Diagnosis not present

## 2020-09-28 DIAGNOSIS — E785 Hyperlipidemia, unspecified: Secondary | ICD-10-CM | POA: Diagnosis not present

## 2020-09-28 DIAGNOSIS — M810 Age-related osteoporosis without current pathological fracture: Secondary | ICD-10-CM | POA: Diagnosis not present

## 2020-09-30 DIAGNOSIS — Z96641 Presence of right artificial hip joint: Secondary | ICD-10-CM | POA: Diagnosis not present

## 2020-09-30 DIAGNOSIS — E785 Hyperlipidemia, unspecified: Secondary | ICD-10-CM | POA: Diagnosis not present

## 2020-09-30 DIAGNOSIS — Z471 Aftercare following joint replacement surgery: Secondary | ICD-10-CM | POA: Diagnosis not present

## 2020-09-30 DIAGNOSIS — M109 Gout, unspecified: Secondary | ICD-10-CM | POA: Diagnosis not present

## 2020-09-30 DIAGNOSIS — I480 Paroxysmal atrial fibrillation: Secondary | ICD-10-CM | POA: Diagnosis not present

## 2020-09-30 DIAGNOSIS — K219 Gastro-esophageal reflux disease without esophagitis: Secondary | ICD-10-CM | POA: Diagnosis not present

## 2020-09-30 DIAGNOSIS — M810 Age-related osteoporosis without current pathological fracture: Secondary | ICD-10-CM | POA: Diagnosis not present

## 2020-09-30 DIAGNOSIS — R32 Unspecified urinary incontinence: Secondary | ICD-10-CM | POA: Diagnosis not present

## 2020-09-30 DIAGNOSIS — I10 Essential (primary) hypertension: Secondary | ICD-10-CM | POA: Diagnosis not present

## 2020-10-02 DIAGNOSIS — M810 Age-related osteoporosis without current pathological fracture: Secondary | ICD-10-CM | POA: Diagnosis not present

## 2020-10-02 DIAGNOSIS — M109 Gout, unspecified: Secondary | ICD-10-CM | POA: Diagnosis not present

## 2020-10-02 DIAGNOSIS — I480 Paroxysmal atrial fibrillation: Secondary | ICD-10-CM | POA: Diagnosis not present

## 2020-10-02 DIAGNOSIS — E785 Hyperlipidemia, unspecified: Secondary | ICD-10-CM | POA: Diagnosis not present

## 2020-10-02 DIAGNOSIS — Z96641 Presence of right artificial hip joint: Secondary | ICD-10-CM | POA: Diagnosis not present

## 2020-10-02 DIAGNOSIS — I10 Essential (primary) hypertension: Secondary | ICD-10-CM | POA: Diagnosis not present

## 2020-10-02 DIAGNOSIS — K219 Gastro-esophageal reflux disease without esophagitis: Secondary | ICD-10-CM | POA: Diagnosis not present

## 2020-10-02 DIAGNOSIS — Z471 Aftercare following joint replacement surgery: Secondary | ICD-10-CM | POA: Diagnosis not present

## 2020-10-02 DIAGNOSIS — R32 Unspecified urinary incontinence: Secondary | ICD-10-CM | POA: Diagnosis not present

## 2020-10-05 DIAGNOSIS — M810 Age-related osteoporosis without current pathological fracture: Secondary | ICD-10-CM | POA: Diagnosis not present

## 2020-10-05 DIAGNOSIS — M109 Gout, unspecified: Secondary | ICD-10-CM | POA: Diagnosis not present

## 2020-10-05 DIAGNOSIS — Z471 Aftercare following joint replacement surgery: Secondary | ICD-10-CM | POA: Diagnosis not present

## 2020-10-05 DIAGNOSIS — R32 Unspecified urinary incontinence: Secondary | ICD-10-CM | POA: Diagnosis not present

## 2020-10-05 DIAGNOSIS — E785 Hyperlipidemia, unspecified: Secondary | ICD-10-CM | POA: Diagnosis not present

## 2020-10-05 DIAGNOSIS — I10 Essential (primary) hypertension: Secondary | ICD-10-CM | POA: Diagnosis not present

## 2020-10-05 DIAGNOSIS — Z96641 Presence of right artificial hip joint: Secondary | ICD-10-CM | POA: Diagnosis not present

## 2020-10-05 DIAGNOSIS — I480 Paroxysmal atrial fibrillation: Secondary | ICD-10-CM | POA: Diagnosis not present

## 2020-10-05 DIAGNOSIS — K219 Gastro-esophageal reflux disease without esophagitis: Secondary | ICD-10-CM | POA: Diagnosis not present

## 2020-10-07 DIAGNOSIS — I480 Paroxysmal atrial fibrillation: Secondary | ICD-10-CM | POA: Diagnosis not present

## 2020-10-07 DIAGNOSIS — I10 Essential (primary) hypertension: Secondary | ICD-10-CM | POA: Diagnosis not present

## 2020-10-07 DIAGNOSIS — K219 Gastro-esophageal reflux disease without esophagitis: Secondary | ICD-10-CM | POA: Diagnosis not present

## 2020-10-07 DIAGNOSIS — Z471 Aftercare following joint replacement surgery: Secondary | ICD-10-CM | POA: Diagnosis not present

## 2020-10-07 DIAGNOSIS — M810 Age-related osteoporosis without current pathological fracture: Secondary | ICD-10-CM | POA: Diagnosis not present

## 2020-10-07 DIAGNOSIS — M109 Gout, unspecified: Secondary | ICD-10-CM | POA: Diagnosis not present

## 2020-10-07 DIAGNOSIS — Z96641 Presence of right artificial hip joint: Secondary | ICD-10-CM | POA: Diagnosis not present

## 2020-10-07 DIAGNOSIS — R32 Unspecified urinary incontinence: Secondary | ICD-10-CM | POA: Diagnosis not present

## 2020-10-07 DIAGNOSIS — E785 Hyperlipidemia, unspecified: Secondary | ICD-10-CM | POA: Diagnosis not present

## 2020-10-09 DIAGNOSIS — I480 Paroxysmal atrial fibrillation: Secondary | ICD-10-CM | POA: Diagnosis not present

## 2020-10-09 DIAGNOSIS — K219 Gastro-esophageal reflux disease without esophagitis: Secondary | ICD-10-CM | POA: Diagnosis not present

## 2020-10-09 DIAGNOSIS — M109 Gout, unspecified: Secondary | ICD-10-CM | POA: Diagnosis not present

## 2020-10-09 DIAGNOSIS — Z471 Aftercare following joint replacement surgery: Secondary | ICD-10-CM | POA: Diagnosis not present

## 2020-10-09 DIAGNOSIS — Z96641 Presence of right artificial hip joint: Secondary | ICD-10-CM | POA: Diagnosis not present

## 2020-10-09 DIAGNOSIS — M810 Age-related osteoporosis without current pathological fracture: Secondary | ICD-10-CM | POA: Diagnosis not present

## 2020-10-09 DIAGNOSIS — R32 Unspecified urinary incontinence: Secondary | ICD-10-CM | POA: Diagnosis not present

## 2020-10-09 DIAGNOSIS — I10 Essential (primary) hypertension: Secondary | ICD-10-CM | POA: Diagnosis not present

## 2020-10-09 DIAGNOSIS — E785 Hyperlipidemia, unspecified: Secondary | ICD-10-CM | POA: Diagnosis not present

## 2020-10-13 DIAGNOSIS — I480 Paroxysmal atrial fibrillation: Secondary | ICD-10-CM | POA: Diagnosis not present

## 2020-10-13 DIAGNOSIS — Z96641 Presence of right artificial hip joint: Secondary | ICD-10-CM | POA: Diagnosis not present

## 2020-10-13 DIAGNOSIS — I10 Essential (primary) hypertension: Secondary | ICD-10-CM | POA: Diagnosis not present

## 2020-10-13 DIAGNOSIS — M109 Gout, unspecified: Secondary | ICD-10-CM | POA: Diagnosis not present

## 2020-10-13 DIAGNOSIS — Z471 Aftercare following joint replacement surgery: Secondary | ICD-10-CM | POA: Diagnosis not present

## 2020-10-13 DIAGNOSIS — E785 Hyperlipidemia, unspecified: Secondary | ICD-10-CM | POA: Diagnosis not present

## 2020-10-13 DIAGNOSIS — R32 Unspecified urinary incontinence: Secondary | ICD-10-CM | POA: Diagnosis not present

## 2020-10-13 DIAGNOSIS — K219 Gastro-esophageal reflux disease without esophagitis: Secondary | ICD-10-CM | POA: Diagnosis not present

## 2020-10-13 DIAGNOSIS — M810 Age-related osteoporosis without current pathological fracture: Secondary | ICD-10-CM | POA: Diagnosis not present

## 2020-11-05 DIAGNOSIS — E782 Mixed hyperlipidemia: Secondary | ICD-10-CM | POA: Diagnosis not present

## 2020-11-05 DIAGNOSIS — E039 Hypothyroidism, unspecified: Secondary | ICD-10-CM | POA: Diagnosis not present

## 2020-11-12 DIAGNOSIS — H52223 Regular astigmatism, bilateral: Secondary | ICD-10-CM | POA: Diagnosis not present

## 2020-11-12 DIAGNOSIS — H43813 Vitreous degeneration, bilateral: Secondary | ICD-10-CM | POA: Diagnosis not present

## 2020-11-12 DIAGNOSIS — H2513 Age-related nuclear cataract, bilateral: Secondary | ICD-10-CM | POA: Diagnosis not present

## 2020-11-12 DIAGNOSIS — H524 Presbyopia: Secondary | ICD-10-CM | POA: Diagnosis not present

## 2020-11-12 DIAGNOSIS — H5203 Hypermetropia, bilateral: Secondary | ICD-10-CM | POA: Diagnosis not present

## 2020-11-23 DIAGNOSIS — Z96641 Presence of right artificial hip joint: Secondary | ICD-10-CM | POA: Diagnosis not present

## 2020-11-26 DIAGNOSIS — E039 Hypothyroidism, unspecified: Secondary | ICD-10-CM | POA: Diagnosis not present

## 2020-11-26 DIAGNOSIS — E236 Other disorders of pituitary gland: Secondary | ICD-10-CM | POA: Diagnosis not present

## 2020-11-30 ENCOUNTER — Other Ambulatory Visit: Payer: Self-pay

## 2020-11-30 ENCOUNTER — Other Ambulatory Visit: Payer: Self-pay | Admitting: *Deleted

## 2020-11-30 DIAGNOSIS — Z96641 Presence of right artificial hip joint: Secondary | ICD-10-CM | POA: Diagnosis not present

## 2020-11-30 MED ORDER — APIXABAN 5 MG PO TABS: 5.0000 mg | ORAL_TABLET | Freq: Two times a day (BID) | ORAL | 1 refills | Status: DC

## 2020-11-30 NOTE — Telephone Encounter (Signed)
41F, 73.2KG, SCR 1.1 11/05/20, LOVW/GOLLAN 08/03/20

## 2020-11-30 NOTE — Telephone Encounter (Signed)
Prescription refill request for Eliquis received. Indication: PAF Last office visit: 08/03/20 Scr: 1.1 on 11/05/20 Age: 80 Weight: 73.2kg  Based on above findings Eliquis 5mg  twice daily is the appropriate dose.  Refill approved.

## 2020-12-02 DIAGNOSIS — I959 Hypotension, unspecified: Secondary | ICD-10-CM | POA: Diagnosis not present

## 2020-12-02 DIAGNOSIS — R001 Bradycardia, unspecified: Secondary | ICD-10-CM | POA: Diagnosis not present

## 2020-12-02 DIAGNOSIS — E6609 Other obesity due to excess calories: Secondary | ICD-10-CM | POA: Diagnosis not present

## 2020-12-02 DIAGNOSIS — E782 Mixed hyperlipidemia: Secondary | ICD-10-CM | POA: Diagnosis not present

## 2020-12-02 DIAGNOSIS — N1831 Chronic kidney disease, stage 3a: Secondary | ICD-10-CM | POA: Diagnosis not present

## 2020-12-05 ENCOUNTER — Telehealth: Payer: Self-pay | Admitting: Physician Assistant

## 2020-12-05 NOTE — Telephone Encounter (Signed)
Pt called because she has not gotten the Eliquis from [place].  MD on call for PCP told her he would send in 30-day rx   Advised her to recontact [place] if that does not work out.  Rosaria Ferries, PA-C 12/05/2020 3:46 PM

## 2020-12-15 ENCOUNTER — Other Ambulatory Visit: Payer: Self-pay

## 2020-12-15 MED ORDER — METOPROLOL SUCCINATE ER 25 MG PO TB24: 12.5000 mg | ORAL_TABLET | Freq: Every day | ORAL | 3 refills | Status: DC

## 2020-12-29 ENCOUNTER — Other Ambulatory Visit: Payer: Self-pay

## 2020-12-29 MED ORDER — FUROSEMIDE 20 MG PO TABS: 20.0000 mg | ORAL_TABLET | Freq: Every day | ORAL | 0 refills | Status: DC

## 2020-12-29 MED ORDER — HYDROCHLOROTHIAZIDE 12.5 MG PO CAPS: 12.5000 mg | ORAL_CAPSULE | Freq: Every day | ORAL | 0 refills | Status: DC

## 2021-01-01 DIAGNOSIS — E236 Other disorders of pituitary gland: Secondary | ICD-10-CM | POA: Diagnosis not present

## 2021-01-01 DIAGNOSIS — E039 Hypothyroidism, unspecified: Secondary | ICD-10-CM | POA: Diagnosis not present

## 2021-01-29 DIAGNOSIS — Z03818 Encounter for observation for suspected exposure to other biological agents ruled out: Secondary | ICD-10-CM | POA: Diagnosis not present

## 2021-01-29 DIAGNOSIS — Z20822 Contact with and (suspected) exposure to covid-19: Secondary | ICD-10-CM | POA: Diagnosis not present

## 2021-02-03 ENCOUNTER — Telehealth: Payer: Self-pay

## 2021-02-03 NOTE — Telephone Encounter (Signed)
Called patient in response to her MyChart message. We rescheduled her 6 month follow up for next week due to her Covid infection.  Patient was grateful for the call.

## 2021-02-04 ENCOUNTER — Ambulatory Visit: Payer: Medicare HMO | Admitting: Cardiology

## 2021-02-08 DIAGNOSIS — N1831 Chronic kidney disease, stage 3a: Secondary | ICD-10-CM | POA: Diagnosis not present

## 2021-02-11 ENCOUNTER — Ambulatory Visit: Payer: Medicare HMO | Admitting: Cardiology

## 2021-02-11 ENCOUNTER — Encounter: Payer: Self-pay | Admitting: Cardiology

## 2021-02-11 ENCOUNTER — Other Ambulatory Visit: Payer: Self-pay

## 2021-02-11 VITALS — BP 130/64 | HR 46 | Ht 60.0 in | Wt 154.0 lb

## 2021-02-11 DIAGNOSIS — I5189 Other ill-defined heart diseases: Secondary | ICD-10-CM | POA: Diagnosis not present

## 2021-02-11 DIAGNOSIS — I480 Paroxysmal atrial fibrillation: Secondary | ICD-10-CM

## 2021-02-11 DIAGNOSIS — I2510 Atherosclerotic heart disease of native coronary artery without angina pectoris: Secondary | ICD-10-CM | POA: Diagnosis not present

## 2021-02-11 DIAGNOSIS — I10 Essential (primary) hypertension: Secondary | ICD-10-CM | POA: Diagnosis not present

## 2021-02-11 MED ORDER — METOPROLOL SUCCINATE ER 25 MG PO TB24: 12.5000 mg | ORAL_TABLET | ORAL | 3 refills | Status: DC

## 2021-02-11 NOTE — Progress Notes (Deleted)
KG

## 2021-02-11 NOTE — Patient Instructions (Addendum)
Medication Instructions:   Your physician has recommended you make the following change in your medication:    DECREASE your Toprol XL (Metoprolol Succinate) 12.5 MG to taking it EVERY OTHER DAY.  *If you need a refill on your cardiac medications before your next appointment, please call your pharmacy*   Lab Work: None ordered If you have labs (blood work) drawn today and your tests are completely normal, you will receive your results only by: [place] (if you have MyChart) OR A paper copy in the mail If you have any lab test that is abnormal or we need to change your treatment, we will call you to review the results.   Testing/Procedures: None ordered   Follow-Up: At [facility], you and your health needs are our priority.  As part of our continuing mission to provide you with exceptional heart care, we have created designated Provider Care Teams.  These Care Teams include your primary Cardiologist (physician) and Advanced Practice Providers (APPs -  Physician Assistants and Nurse Practitioners) who all work together to provide you with the care you need, when you need it.  We recommend signing up for the patient portal called "MyChart".  Sign up information is provided on this After Visit Summary.  MyChart is used to connect with patients for Virtual Visits (Telemedicine).  Patients are able to view lab/test results, encounter notes, upcoming appointments, etc.  Non-urgent messages can be sent to your provider as well.   To learn more about what you can do with MyChart, go to [email or web address].    Your next appointment:   3 month(s)  The format for your next appointment:   In Person  Provider:   Kate Sable, MD   Other Instructions

## 2021-02-11 NOTE — Progress Notes (Signed)
Cardiology Office Note:    Date:  02/11/2021   ID:  Charlene Coleman, DOB 11-08-1940, MRN [id number]  PCP:  Dion Body, MD  [facility] Cardiologist:  Kate Sable, MD  [place] Electrophysiologist:  None   Referring MD: Dion Body, MD   Chief Complaint  Patient presents with   Other    6 month follow up. Patient c/o some SOB with exertion. Meds reviewed verbally with patient.     History of Present Illness:    Charlene Coleman is a 80 y.o. female with a hx of paroxysmal atrial fibrillation, hypertension, hyperlipidemia, non obstructive CAD (LHC 50% in D1, RCA 09/2007), HFpEF who presents for follow-up.    Being seen for chronic medical conditions including A. fib, HFpEF, hypertension.  Previous thyroid function testing showed very elevated TSH.  Previously was on amiodarone.  This was stopped.  Toprol-XL 12.5 mg daily started.  Repeat thyroid function testing 2 months later showed some improvement value although still abnormal.  She follows up with endocrinology and was started on Synthroid.  States feeling short of breath with exertion, states has not done much since having right hip surgery earlier this year.  Denies palpitations, but states her heart rate is very slow and makes her sluggish.  Has occasional dizziness.  Her blood pressure has been well controlled.  Also denies edema.  Her blood work 2 days ago showing normal potassium and creatinine.   Prior notes Upon chart review, patient had a Lexiscan Myoview performed on 09/30/2019 with mild lateral wall ischemia.   Cardiac monitor in the past revealed atrial fibrillation with 12.68% burden.  [place] 2009 showed moderate stenosis 50% in D1, RCA 09/2007 .   Echo 04/6788 normal systolic function, grade 2 diastolic dysfunction, moderate pulmonary hypertension.  Past Medical History:  Diagnosis Date   (HFpEF) heart failure with preserved ejection fraction (HCC)    A-fib (HCC)    Borderline diabetes    Breast  cancer ([place]) 2015   Left side - radiation - lumpectomy   CAD (coronary artery disease)    DCIS (ductal carcinoma in situ)    Empty sella syndrome (HCC)    GERD (gastroesophageal reflux disease)    Gout    Hyperlipidemia    Hypertension    Personal history of radiation therapy    Skin cancer 2011    Past Surgical History:  Procedure Laterality Date   ABDOMINAL HYSTERECTOMY     BREAST BIOPSY Left 2015   positive   BREAST LUMPECTOMY Left 09/23/13   CHOLECYSTECTOMY     GASTRIC FUNDOPLICATION     left lumpectomy      Current Medications: Current Meds  Medication Sig   allopurinol (ZYLOPRIM) 300 MG tablet Take 300 mg by mouth daily.    apixaban (ELIQUIS) 5 MG TABS tablet Take 1 tablet (5 mg total) by mouth 2 (two) times daily.   atorvastatin (LIPITOR) 20 MG tablet Take 20 mg by mouth daily at 6 PM.    Calcium Carbonate-Vitamin D 600-400 MG-UNIT per tablet Take 1 tablet by mouth 3 (three) times a week.    ciprofloxacin (CIPRO) 500 MG tablet Take 500 mg by mouth every 12 (twelve) hours. For 3 days   famotidine (PEPCID) 40 MG tablet Take 40 mg by mouth at bedtime.   furosemide (LASIX) 20 MG tablet Take 1 tablet (20 mg total) by mouth daily.   gabapentin (NEURONTIN) 100 MG capsule Take two capsules (200 mg) in the am & three capsules (300 mg)  in the pm   hydrochlorothiazide (MICROZIDE) 12.5 MG capsule Take 1 capsule (12.5 mg total) by mouth daily.   irbesartan (AVAPRO) 300 MG tablet Take 1 tablet (300 mg total) by mouth daily.   levothyroxine (SYNTHROID) 112 MCG tablet Take 112 mcg by mouth daily before breakfast.   methocarbamol (ROBAXIN) 500 MG tablet Take 250-500 mg by mouth 2 (two) times daily as needed.   oxybutynin (DITROPAN-XL) 5 MG 24 hr tablet Take 5 mg by mouth in the morning.   [DISCONTINUED] metoprolol succinate (TOPROL XL) 25 MG 24 hr tablet Take 0.5 tablets (12.5 mg total) by mouth daily.     Allergies:   Enalapril and Tylenol [acetaminophen]   Social History    Socioeconomic History   Marital status: Married    Spouse name: Not on file   Number of children: Not on file   Years of education: Not on file   Highest education level: Not on file  Occupational History   Not on file  Tobacco Use   Smoking status: Never   Smokeless tobacco: Never  Vaping Use   Vaping Use: Never used  Substance and Sexual Activity   Alcohol use: No   Drug use: No   Sexual activity: Not on file  Other Topics Concern   Not on file  Social History Narrative   Not on file   Social Determinants of Health   Financial Resource Strain: Not on file  Food Insecurity: Not on file  Transportation Needs: Not on file  Physical Activity: Not on file  Stress: Not on file  Social Connections: Not on file     Family History: The patient's family history includes Breast cancer in her cousin; Breast cancer (age of onset: 89) in her maternal aunt.  ROS:   Please see the history of present illness.     All other systems reviewed and are negative.  EKGs/Labs/Other Studies Reviewed:    The following studies were reviewed today:   EKG:  EKG is  ordered today.  The ekg ordered today demonstrates sinus bradycardia otherwise normal EKG, heart rate 46  Recent Labs: 07/29/2020: Hemoglobin 13.1; Platelets 258 08/03/2020: TSH 15.000 08/12/2020: BUN 18; Creatinine, Ser 1.15; Potassium 3.6; Sodium 139  Recent Lipid Panel No results found for: CHOL, TRIG, HDL, CHOLHDL, VLDL, LDLCALC, LDLDIRECT  Physical Exam:    VS:  BP 130/64 (BP Location: Left Arm, Patient Position: Sitting, Cuff Size: Normal)   Pulse (!) 46   Ht 5' (1.524 m)   Wt 154 lb (69.9 kg)   SpO2 96%   BMI 30.08 kg/m     Wt Readings from Last 3 Encounters:  02/11/21 154 lb (69.9 kg)  08/03/20 161 lb 6 oz (73.2 kg)  07/29/20 162 lb (73.5 kg)     GEN:  Well nourished, well developed in no acute distress HEENT: Normal NECK: No JVD; No carotid bruits LYMPHATICS: No lymphadenopathy CARDIAC: Bradycardic,  regular, no murmurs, rubs, gallops RESPIRATORY:  Clear to auscultation without rales, wheezing or rhonchi  ABDOMEN: Soft, non-tender, non-distended MUSCULOSKELETAL:  No edema; No deformity  SKIN: Warm and dry NEUROLOGIC:  Alert and oriented x 3 PSYCHIATRIC:  Normal affect   ASSESSMENT:    1. Coronary artery disease involving native coronary artery of native heart without angina pectoris   2. Paroxysmal atrial fibrillation (HCC)   3. Diastolic dysfunction   4. Essential hypertension     PLAN:    In order of problems listed above:  CAD, LHC 2009 with moderate  stenosis in the first diagonal and RCA.  Currently denies chest pain.  Continue Eliquis, Lipitor.  Echo 12/2692 normal systolic function, EF 55 to 60%.  Grade 2 diastolic dysfunction. paroxysmal atrial fibrillation, currently in sinus brady, heart rate 47.  CHA2DS2-VASc of 4 (age, htn, gender).  Decrease Toprol-XL 12.5 to every other day.  Continue Eliquis.  Previously was on amiodarone, developed thyroid dysfunction.  Will refer to EP for additional input regarding antiarrhythmics. Grade 2 diastolic dysfunction on last echo.  Denies edema, shortness of breath likely from deconditioning.  Continue Lasix 20 mg daily. hypertension, blood pressure controlled, continue irbesartan 300 mg daily, HCTZ 12.5 mg daily.    Follow-up in 3 months.    Medication Adjustments/Labs and Tests Ordered: Current medicines are reviewed at length with the patient today.  Concerns regarding medicines are outlined above.  Orders Placed This Encounter  Procedures   Ambulatory referral to Cardiac Electrophysiology   EKG 12-Lead    Meds ordered this encounter  Medications   metoprolol succinate (TOPROL XL) 25 MG 24 hr tablet    Sig: Take 0.5 tablets (12.5 mg total) by mouth every other day.    Dispense:  90 tablet    Refill:  3     Patient Instructions  Medication Instructions:   Your physician has recommended you make the following change in  your medication:    DECREASE your Toprol XL (Metoprolol Succinate) 12.5 MG to taking it EVERY OTHER DAY.  *If you need a refill on your cardiac medications before your next appointment, please call your pharmacy*   Lab Work: None ordered If you have labs (blood work) drawn today and your tests are completely normal, you will receive your results only by: [place] (if you have MyChart) OR A paper copy in the mail If you have any lab test that is abnormal or we need to change your treatment, we will call you to review the results.   Testing/Procedures: None ordered   Follow-Up: At [facility], you and your health needs are our priority.  As part of our continuing mission to provide you with exceptional heart care, we have created designated Provider Care Teams.  These Care Teams include your primary Cardiologist (physician) and Advanced Practice Providers (APPs -  Physician Assistants and Nurse Practitioners) who all work together to provide you with the care you need, when you need it.  We recommend signing up for the patient portal called "MyChart".  Sign up information is provided on this After Visit Summary.  MyChart is used to connect with patients for Virtual Visits (Telemedicine).  Patients are able to view lab/test results, encounter notes, upcoming appointments, etc.  Non-urgent messages can be sent to your provider as well.   To learn more about what you can do with MyChart, go to [email or web address].    Your next appointment:   3 month(s)  The format for your next appointment:   In Person  Provider:   Kate Sable, MD   Other Instructions     Signed, Kate Sable, MD  02/11/2021 1:05 PM    [place]

## 2021-02-15 DIAGNOSIS — E6609 Other obesity due to excess calories: Secondary | ICD-10-CM | POA: Diagnosis not present

## 2021-02-15 DIAGNOSIS — E782 Mixed hyperlipidemia: Secondary | ICD-10-CM | POA: Diagnosis not present

## 2021-02-15 DIAGNOSIS — I10 Essential (primary) hypertension: Secondary | ICD-10-CM | POA: Diagnosis not present

## 2021-02-15 DIAGNOSIS — R001 Bradycardia, unspecified: Secondary | ICD-10-CM | POA: Diagnosis not present

## 2021-03-01 ENCOUNTER — Other Ambulatory Visit: Payer: Self-pay | Admitting: *Deleted

## 2021-03-01 MED ORDER — FUROSEMIDE 20 MG PO TABS: 20.0000 mg | ORAL_TABLET | Freq: Every day | ORAL | 0 refills | Status: DC

## 2021-03-17 ENCOUNTER — Ambulatory Visit: Payer: Medicare HMO | Admitting: Cardiology

## 2021-03-17 ENCOUNTER — Encounter: Payer: Self-pay | Admitting: Cardiology

## 2021-03-17 ENCOUNTER — Other Ambulatory Visit: Payer: Self-pay

## 2021-03-17 VITALS — BP 130/80 | HR 84 | Ht 60.0 in | Wt 154.0 lb

## 2021-03-17 DIAGNOSIS — I480 Paroxysmal atrial fibrillation: Secondary | ICD-10-CM

## 2021-03-17 DIAGNOSIS — I10 Essential (primary) hypertension: Secondary | ICD-10-CM | POA: Diagnosis not present

## 2021-03-17 DIAGNOSIS — I2510 Atherosclerotic heart disease of native coronary artery without angina pectoris: Secondary | ICD-10-CM | POA: Diagnosis not present

## 2021-03-17 NOTE — Patient Instructions (Addendum)

## 2021-03-17 NOTE — Progress Notes (Signed)
Electrophysiology Office Note:    Date:  03/17/2021   ID:  ELBONY SHOLL, DOB 1941-06-14, MRN [id number]  PCP:  Dion Body, MD  [facility] Cardiologist:  Kate Sable, MD  [facility] Electrophysiologist:  Vickie Epley, MD   Referring MD: Kate Sable, MD   Chief Complaint: Atrial fibrillation  History of Present Illness:    Charlene Coleman is a 80 y.o. female who presents for an evaluation of paroxysmal atrial fibrillation at the request of Dr Garen Lah. Their medical history includes chronic diastolic heart failure, history of breast cancerand hypertension.  She previously was managed with amiodarone but this caused significant thyroid dysfunction and was stopped. She has been off of the amio for about 3 months.  She has not experienced another prolonged episode of AF. Two weeks ago she had an episode of elevated BP near [id number] systolic with a HR just over 100bpm that she thinks was an episode of AF. This episode subsided without intervention. She continues to take eliquis for stroke ppx.   Past Medical History:  Diagnosis Date   (HFpEF) heart failure with preserved ejection fraction (HCC)    A-fib (HCC)    Borderline diabetes    Breast cancer ([place]) 2015   Left side - radiation - lumpectomy   CAD (coronary artery disease)    DCIS (ductal carcinoma in situ)    Empty sella syndrome (HCC)    GERD (gastroesophageal reflux disease)    Gout    Hyperlipidemia    Hypertension    Personal history of radiation therapy    Skin cancer 2011    Past Surgical History:  Procedure Laterality Date   ABDOMINAL HYSTERECTOMY     BREAST BIOPSY Left 2015   positive   BREAST LUMPECTOMY Left 09/23/13   CHOLECYSTECTOMY     GASTRIC FUNDOPLICATION     left lumpectomy      Current Medications: Current Meds  Medication Sig   allopurinol (ZYLOPRIM) 300 MG tablet Take 300 mg by mouth daily.    apixaban (ELIQUIS) 5 MG TABS tablet Take 1 tablet (5 mg total) by  mouth 2 (two) times daily.   atorvastatin (LIPITOR) 20 MG tablet Take 20 mg by mouth daily at 6 PM.    Calcium Carbonate-Vitamin D 600-400 MG-UNIT per tablet Take 1 tablet by mouth 3 (three) times a week.    famotidine (PEPCID) 40 MG tablet Take 40 mg by mouth at bedtime.   furosemide (LASIX) 20 MG tablet Take 1 tablet (20 mg total) by mouth daily.   gabapentin (NEURONTIN) 100 MG capsule Take two capsules (200 mg) in the am & three capsules (300 mg) in the pm   hydrochlorothiazide (MICROZIDE) 12.5 MG capsule Take 1 capsule (12.5 mg total) by mouth daily.   irbesartan (AVAPRO) 300 MG tablet Take 1 tablet (300 mg total) by mouth daily.   levothyroxine (SYNTHROID) 112 MCG tablet Take 112 mcg by mouth daily before breakfast.   methocarbamol (ROBAXIN) 500 MG tablet Take 250-500 mg by mouth 2 (two) times daily as needed.   metoprolol succinate (TOPROL XL) 25 MG 24 hr tablet Take 0.5 tablets (12.5 mg total) by mouth every other day.   oxybutynin (DITROPAN-XL) 5 MG 24 hr tablet Take 5 mg by mouth in the morning.     Allergies:   Enalapril and Tylenol [acetaminophen]   Social History   Socioeconomic History   Marital status: Married    Spouse name: Not on file   Number of children: Not  on file   Years of education: Not on file   Highest education level: Not on file  Occupational History   Not on file  Tobacco Use   Smoking status: Never   Smokeless tobacco: Never  Vaping Use   Vaping Use: Never used  Substance and Sexual Activity   Alcohol use: No   Drug use: No   Sexual activity: Not on file  Other Topics Concern   Not on file  Social History Narrative   Not on file   Social Determinants of Health   Financial Resource Strain: Not on file  Food Insecurity: Not on file  Transportation Needs: Not on file  Physical Activity: Not on file  Stress: Not on file  Social Connections: Not on file     Family History: The patient's family history includes Breast cancer in her cousin;  Breast cancer (age of onset: 12) in her maternal aunt.  ROS:   Please see the history of present illness.    All other systems reviewed and are negative.  EKGs/Labs/Other Studies Reviewed:    The following studies were reviewed today: Prior notes  03/31/2020 Echo personally reviewed EF 55% RV normal Moderate MR   EKG:  The ekg ordered today demonstrates sinus bradycardia  Recent Labs: 07/29/2020: Hemoglobin 13.1; Platelets 258 08/03/2020: TSH 15.000 08/12/2020: BUN 18; Creatinine, Ser 1.15; Potassium 3.6; Sodium 139  Recent Lipid Panel No results found for: CHOL, TRIG, HDL, CHOLHDL, VLDL, LDLCALC, LDLDIRECT  Physical Exam:    VS:  BP 130/80   Pulse 84   Ht 5' (1.524 m)   Wt 154 lb (69.9 kg)   SpO2 95%   BMI 30.08 kg/m     Wt Readings from Last 3 Encounters:  03/17/21 154 lb (69.9 kg)  02/11/21 154 lb (69.9 kg)  08/03/20 161 lb 6 oz (73.2 kg)     GEN:  Well nourished, well developed in no acute distress HEENT: Normal NECK: No JVD; No carotid bruits LYMPHATICS: No lymphadenopathy CARDIAC: RRR, no murmurs, rubs, gallops RESPIRATORY:  Clear to auscultation without rales, wheezing or rhonchi  ABDOMEN: Soft, non-tender, non-distended MUSCULOSKELETAL:  No edema; No deformity  SKIN: Warm and dry NEUROLOGIC:  Alert and oriented x 3 PSYCHIATRIC:  Normal affect   ASSESSMENT:    1. Paroxysmal atrial fibrillation (HCC)   2. Essential hypertension   3. Coronary artery disease involving native coronary artery of native heart without angina pectoris    PLAN:    In order of problems listed above:   1. Paroxysmal atrial fibrillation (HCC) Very low burden of AF. Has been off amiodarone for about 3 months now because of thyroid dysfunction. Would recommend not starting another antiarrhythmic drug at this time because of her resting bradycardia. If she experienced an increased burden of AF, would recommend dofetilide. She should continue taking eliquis for stroke ppx.  2.  Essential hypertension Mostly controlled. At goal today. Continue current regimen.  3. Coronary artery disease involving native coronary artery of native heart without angina pectoris No ischemic symptoms today.  Follow up with EP as needed.   Medication Adjustments/Labs and Tests Ordered: Current medicines are reviewed at length with the patient today.  Concerns regarding medicines are outlined above.  Orders Placed This Encounter  Procedures   EKG 12-Lead    No orders of the defined types were placed in this encounter.    Signed, Hilton Cork. Quentin Ore, MD, [facility], [facility] 03/17/2021 3:19 PM    Electrophysiology [facility]

## 2021-03-31 DIAGNOSIS — D2262 Melanocytic nevi of left upper limb, including shoulder: Secondary | ICD-10-CM | POA: Diagnosis not present

## 2021-03-31 DIAGNOSIS — D2261 Melanocytic nevi of right upper limb, including shoulder: Secondary | ICD-10-CM | POA: Diagnosis not present

## 2021-03-31 DIAGNOSIS — X32XXXA Exposure to sunlight, initial encounter: Secondary | ICD-10-CM | POA: Diagnosis not present

## 2021-03-31 DIAGNOSIS — Z85828 Personal history of other malignant neoplasm of skin: Secondary | ICD-10-CM | POA: Diagnosis not present

## 2021-03-31 DIAGNOSIS — L570 Actinic keratosis: Secondary | ICD-10-CM | POA: Diagnosis not present

## 2021-03-31 DIAGNOSIS — D225 Melanocytic nevi of trunk: Secondary | ICD-10-CM | POA: Diagnosis not present

## 2021-03-31 DIAGNOSIS — D485 Neoplasm of uncertain behavior of skin: Secondary | ICD-10-CM | POA: Diagnosis not present

## 2021-03-31 DIAGNOSIS — D0439 Carcinoma in situ of skin of other parts of face: Secondary | ICD-10-CM | POA: Diagnosis not present

## 2021-04-07 IMAGING — CR DG KNEE COMPLETE 4+V*R*
1 series · 4 of 4 positions shown · non-contrast
Comparison: None.

CLINICAL DATA: Pain and swelling

EXAM:
RIGHT KNEE - COMPLETE 4+ VIEW

[Series 1: dg knee complete 4 views right · 0.14mm/px · 4 of 4 slices shown]
[im 1/4]
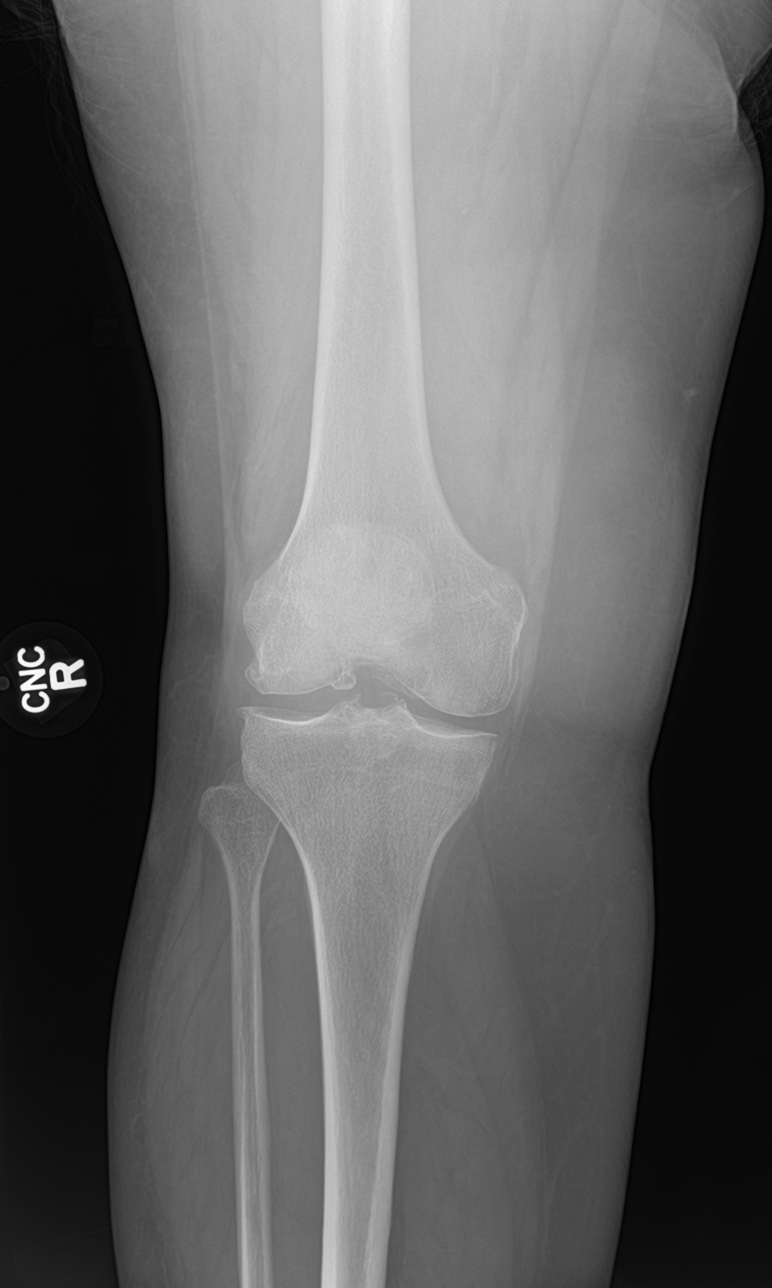
[im 2/4]
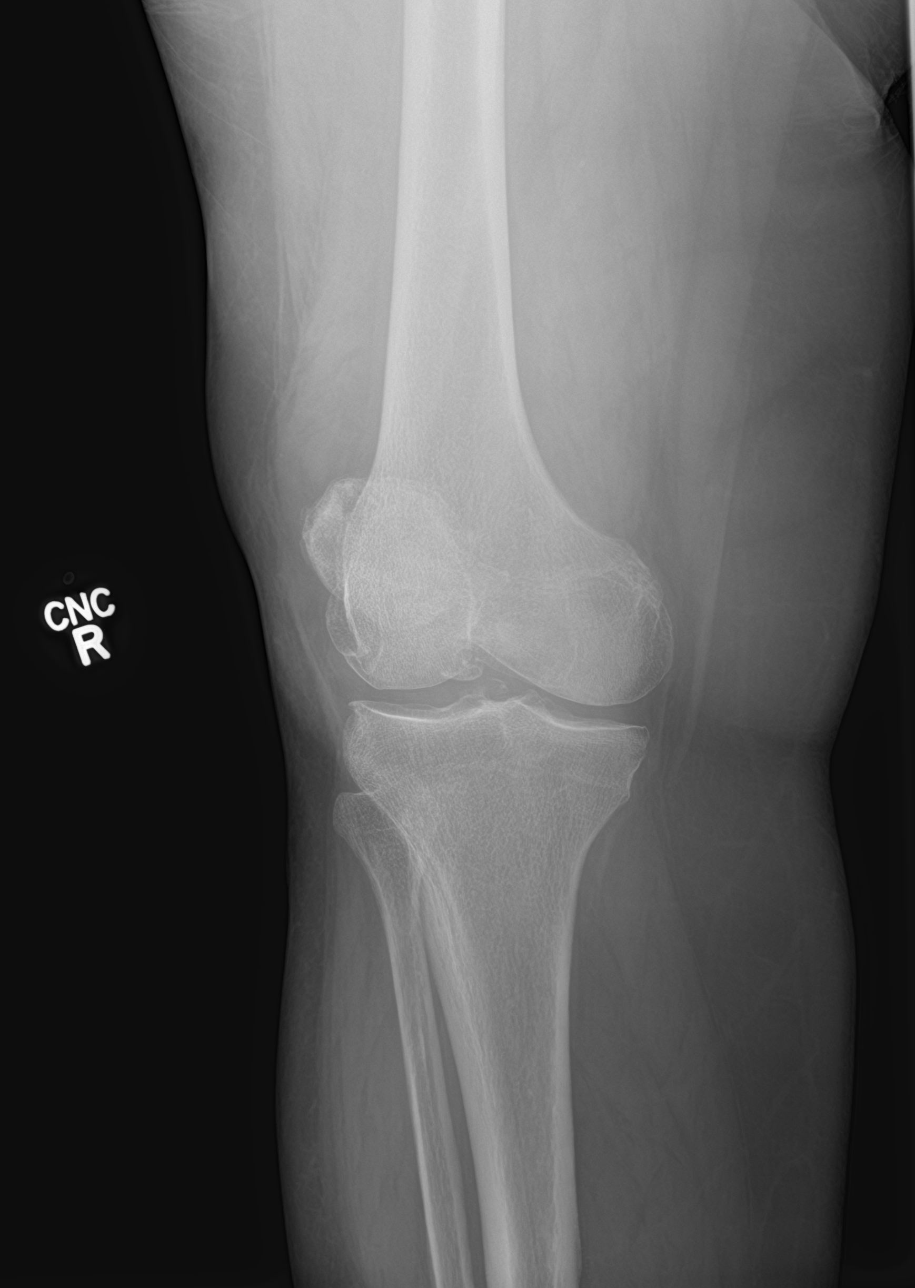
[im 3/4]
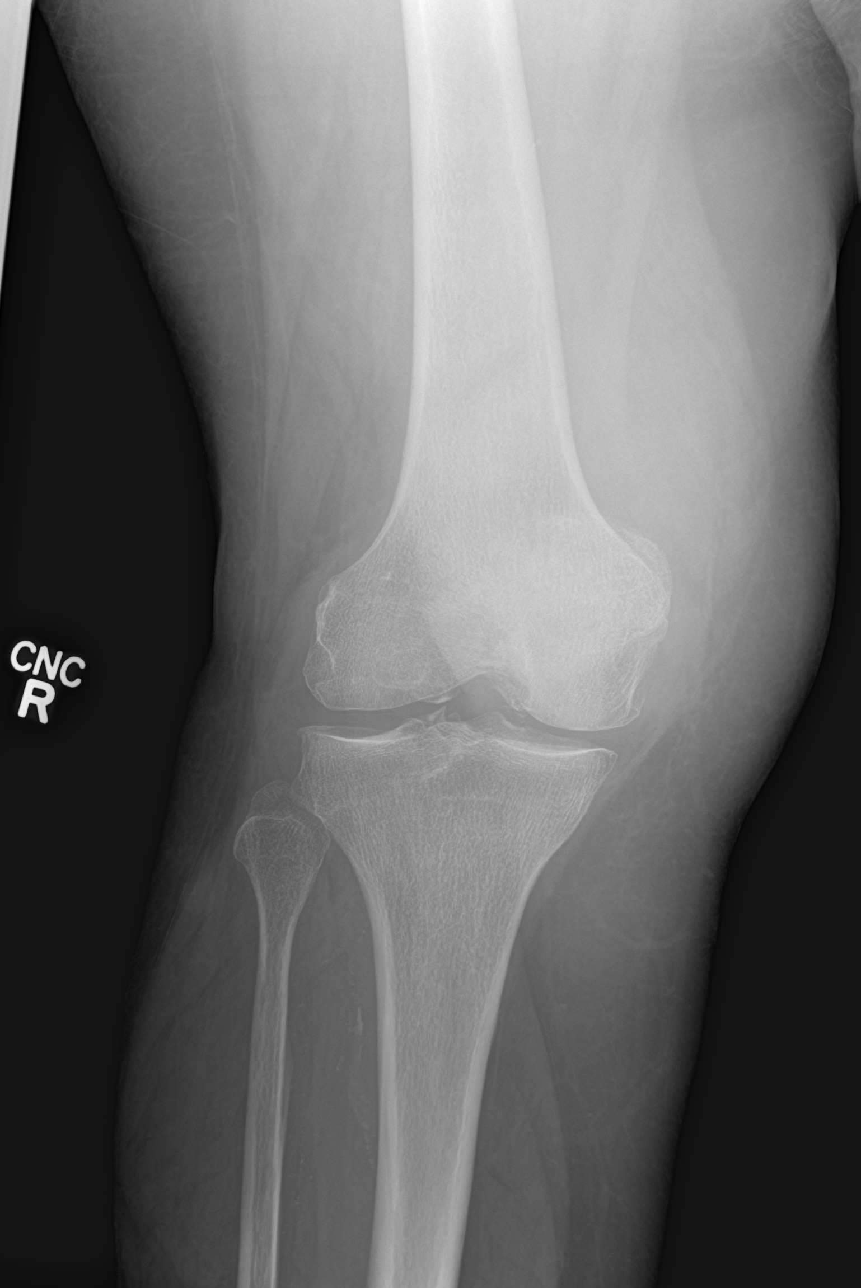
[im 4/4]
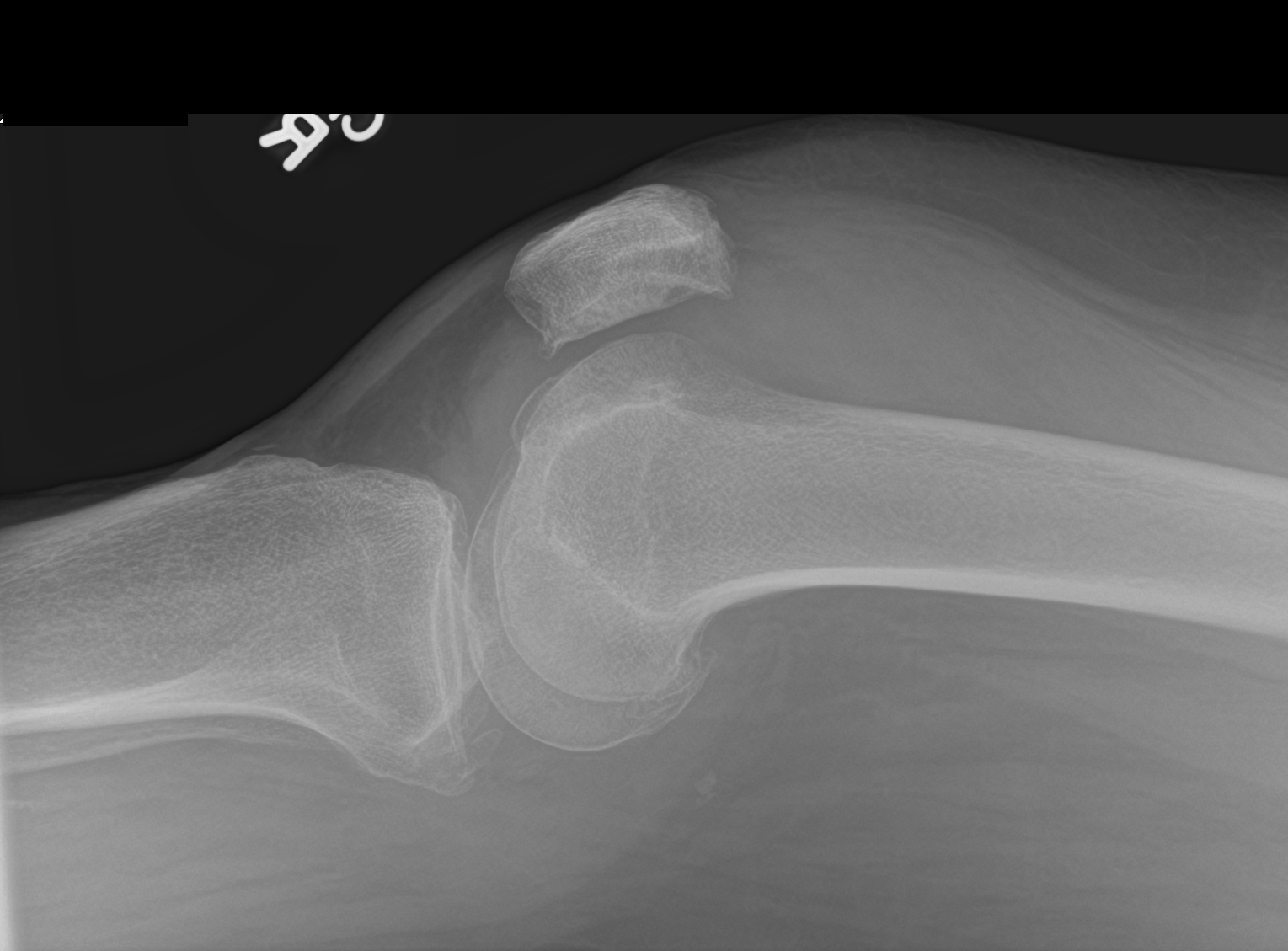

[4 of 4 positions shown; findings below may reference images not displayed]

FINDINGS: Frontal, lateral, and bilateral oblique views were obtained. There
is a degree of soft tissue swelling. There is a large joint
effusion. No acute fracture or dislocation evident. There is
spurring laterally and arising from the posterior patellofemoral
joint. There is no significant joint space narrowing. No erosion.
IMPRESSION: Soft tissue swelling with sizable joint effusion. No fracture or
dislocation. Areas of spurring laterally and in the patellofemoral
joint region without appreciable joint space narrowing. No erosion.

## 2021-04-28 ENCOUNTER — Other Ambulatory Visit: Payer: Self-pay | Admitting: Cardiovascular Disease

## 2021-04-28 NOTE — Telephone Encounter (Signed)
Refill Request.  

## 2021-04-28 NOTE — Telephone Encounter (Signed)
Eliquis 5 mg refill request received. Patient is 80 years old, weight-69.9 kg, Crea- 0.9 on 02/08/21 , Diagnosis-PAF, and last seen by Dr. Quentin Ore on 03/17/21. Dose is appropriate based on dosing criteria. Will send in refill to requested pharmacy.

## 2021-04-30 ENCOUNTER — Other Ambulatory Visit: Payer: Self-pay | Admitting: *Deleted

## 2021-04-30 DIAGNOSIS — D0439 Carcinoma in situ of skin of other parts of face: Secondary | ICD-10-CM | POA: Diagnosis not present

## 2021-04-30 MED ORDER — HYDROCHLOROTHIAZIDE 12.5 MG PO CAPS: 12.5000 mg | ORAL_CAPSULE | Freq: Every day | ORAL | 0 refills | Status: DC

## 2021-05-14 ENCOUNTER — Ambulatory Visit (INDEPENDENT_AMBULATORY_CARE_PROVIDER_SITE_OTHER): Payer: Medicare HMO

## 2021-05-14 ENCOUNTER — Other Ambulatory Visit: Payer: Self-pay

## 2021-05-14 ENCOUNTER — Ambulatory Visit: Payer: Medicare HMO | Admitting: Cardiology

## 2021-05-14 ENCOUNTER — Encounter: Payer: Self-pay | Admitting: Cardiology

## 2021-05-14 VITALS — BP 126/60 | HR 57 | Ht 60.0 in | Wt 151.0 lb

## 2021-05-14 DIAGNOSIS — I10 Essential (primary) hypertension: Secondary | ICD-10-CM

## 2021-05-14 DIAGNOSIS — I480 Paroxysmal atrial fibrillation: Secondary | ICD-10-CM

## 2021-05-14 DIAGNOSIS — I2510 Atherosclerotic heart disease of native coronary artery without angina pectoris: Secondary | ICD-10-CM

## 2021-05-14 DIAGNOSIS — I5189 Other ill-defined heart diseases: Secondary | ICD-10-CM | POA: Diagnosis not present

## 2021-05-14 NOTE — Patient Instructions (Signed)
Medication Instructions:   Your physician recommends that you continue on your current medications as directed. Please refer to the Current Medication list given to you today.   *If you need a refill on your cardiac medications before your next appointment, please call your pharmacy*   Lab Work: None ordered If you have labs (blood work) drawn today and your tests are completely normal, you will receive your results only by: [place] (if you have MyChart) OR A paper copy in the mail If you have any lab test that is abnormal or we need to change your treatment, we will call you to review the results.   Testing/Procedures:  Your physician has recommended that you wear a Zio XT monitor for 2 weeks.   This monitor is a medical device that records the heart's electrical activity. Doctors most often use these monitors to diagnose arrhythmias. Arrhythmias are problems with the speed or rhythm of the heartbeat. The monitor is a small device applied to your chest. You can wear one while you do your normal daily activities. While wearing this monitor if you have any symptoms to push the button and record what you felt. Once you have worn this monitor for the period of time provider prescribed (Usually 14 days), you will return the monitor device in the postage paid box. Once it is returned they will download the data collected and provide [place] with a report which the provider will then review and we will call you with those results. Important tips:  Avoid showering during the first 24 hours of wearing the monitor. Avoid excessive sweating to help maximize wear time. Do not submerge the device, no hot tubs, and no swimming pools. Keep any lotions or oils away from the patch. After 24 hours you may shower with the patch on. Take brief showers with your back facing the shower head.  Do not remove patch once it has been placed because that will interrupt data and decrease adhesive wear time. Push  the button when you have any symptoms and write down what you were feeling. Once you have completed wearing your monitor, remove and place into box which has postage paid and place in your outgoing mailbox.  If for some reason you have misplaced your box then call our office and we can provide another box and/or mail it off for you.      Follow-Up: At [facility], you and your health needs are our priority.  As part of our continuing mission to provide you with exceptional heart care, we have created designated Provider Care Teams.  These Care Teams include your primary Cardiologist (physician) and Advanced Practice Providers (APPs -  Physician Assistants and Nurse Practitioners) who all work together to provide you with the care you need, when you need it.  We recommend signing up for the patient portal called "MyChart".  Sign up information is provided on this After Visit Summary.  MyChart is used to connect with patients for Virtual Visits (Telemedicine).  Patients are able to view lab/test results, encounter notes, upcoming appointments, etc.  Non-urgent messages can be sent to your provider as well.   To learn more about what you can do with MyChart, go to [email or web address].    Your next appointment:   4-6 month(s)  The format for your next appointment:   In Person  Provider:   Kate Sable, MD   Other Instructions

## 2021-05-14 NOTE — Progress Notes (Signed)
Cardiology Office Note:    Date:  05/14/2021   ID:  Charlene Coleman, DOB 1941/04/03, MRN [id number]  PCP:  Dion Body, MD  [facility] Cardiologist:  Kate Sable, MD  [facility] Electrophysiologist:  Vickie Epley, MD   Referring MD: Dion Body, MD   Chief Complaint  Patient presents with   Other    3 month follow up -- Patient c.o SOB and afib spells. Meds reviewed verbally with patient.     History of Present Illness:    Charlene Coleman is a 80 y.o. female with a hx of paroxysmal atrial fibrillation, hypertension, hyperlipidemia, non obstructive CAD (LHC 50% in D1, RCA 09/2007), HFpEF who presents for follow-up.    Patient being seen for HFpEF-paroxysmal atrial fibrillation.  Was previously on amiodarone, this was stopped due to thyroid dysfunction.  Her heart rates have been low, Toprol-XL decreased to 12.5 mg every other day.  She was doing okay until recently when she feels she goes into atrial fibrillation more often.  Has symptoms of palpitations and quivering of her heart at least 3 times a week.  Symptoms are associated with fatigue, dizziness.  She denies syncope.  She takes Eliquis as prescribed, denies any bleeding issues.  Denies edema, takes Lasix as prescribed.   Prior notes Upon chart review, patient had a Lexiscan Myoview performed on 09/30/2019 with mild lateral wall ischemia.   Cardiac monitor in the past revealed atrial fibrillation with 12.68% burden.  [place] 2009 showed moderate stenosis 50% in D1, RCA 09/2007 .   Echo 09/5571 normal systolic function, grade 2 diastolic dysfunction, moderate pulmonary hypertension.  Past Medical History:  Diagnosis Date   (HFpEF) heart failure with preserved ejection fraction (HCC)    A-fib (HCC)    Borderline diabetes    Breast cancer ([place]) 2015   Left side - radiation - lumpectomy   CAD (coronary artery disease)    DCIS (ductal carcinoma in situ)    Empty sella syndrome (HCC)    GERD  (gastroesophageal reflux disease)    Gout    Hyperlipidemia    Hypertension    Personal history of radiation therapy    Skin cancer 2011    Past Surgical History:  Procedure Laterality Date   ABDOMINAL HYSTERECTOMY     BREAST BIOPSY Left 2015   positive   BREAST LUMPECTOMY Left 09/23/13   CHOLECYSTECTOMY     GASTRIC FUNDOPLICATION     left lumpectomy      Current Medications: Current Meds  Medication Sig   allopurinol (ZYLOPRIM) 300 MG tablet Take 300 mg by mouth daily.    apixaban (ELIQUIS) 5 MG TABS tablet TAKE 1 TABLET TWICE DAILY   atorvastatin (LIPITOR) 20 MG tablet Take 20 mg by mouth daily at 6 PM.    Calcium Carbonate-Vitamin D 600-400 MG-UNIT per tablet Take 1 tablet by mouth 3 (three) times a week.    furosemide (LASIX) 20 MG tablet Take 1 tablet (20 mg total) by mouth daily.   gabapentin (NEURONTIN) 100 MG capsule Take two capsules (200 mg) in the am & three capsules (300 mg) in the pm   hydrochlorothiazide (MICROZIDE) 12.5 MG capsule Take 1 capsule (12.5 mg total) by mouth daily.   irbesartan (AVAPRO) 300 MG tablet Take 1 tablet (300 mg total) by mouth daily.   levothyroxine (SYNTHROID) 112 MCG tablet Take 112 mcg by mouth daily before breakfast.   methocarbamol (ROBAXIN) 500 MG tablet Take 250-500 mg by mouth 2 (two) times daily  as needed.   metoprolol succinate (TOPROL XL) 25 MG 24 hr tablet Take 0.5 tablets (12.5 mg total) by mouth every other day.   oxybutynin (DITROPAN-XL) 5 MG 24 hr tablet Take 5 mg by mouth in the morning.     Allergies:   Enalapril and Tylenol [acetaminophen]   Social History   Socioeconomic History   Marital status: Married    Spouse name: Not on file   Number of children: Not on file   Years of education: Not on file   Highest education level: Not on file  Occupational History   Not on file  Tobacco Use   Smoking status: Never   Smokeless tobacco: Never  Vaping Use   Vaping Use: Never used  Substance and Sexual Activity    Alcohol use: No   Drug use: No   Sexual activity: Not on file  Other Topics Concern   Not on file  Social History Narrative   Not on file   Social Determinants of Health   Financial Resource Strain: Not on file  Food Insecurity: Not on file  Transportation Needs: Not on file  Physical Activity: Not on file  Stress: Not on file  Social Connections: Not on file     Family History: The patient's family history includes Breast cancer in her cousin; Breast cancer (age of onset: 73) in her maternal aunt.  ROS:   Please see the history of present illness.     All other systems reviewed and are negative.  EKGs/Labs/Other Studies Reviewed:    The following studies were reviewed today:   EKG:  EKG is  ordered today.  The ekg ordered today demonstrates sinus bradycardia otherwise normal EKG, heart rate 57  Recent Labs: 07/29/2020: Hemoglobin 13.1; Platelets 258 08/03/2020: TSH 15.000 08/12/2020: BUN 18; Creatinine, Ser 1.15; Potassium 3.6; Sodium 139  Recent Lipid Panel No results found for: CHOL, TRIG, HDL, CHOLHDL, VLDL, LDLCALC, LDLDIRECT  Physical Exam:    VS:  BP 126/60 (BP Location: Left Arm, Patient Position: Sitting, Cuff Size: Normal)   Pulse (!) 57   Ht 5' (1.524 m)   Wt 151 lb (68.5 kg)   SpO2 96%   BMI 29.49 kg/m     Wt Readings from Last 3 Encounters:  05/14/21 151 lb (68.5 kg)  03/17/21 154 lb (69.9 kg)  02/11/21 154 lb (69.9 kg)     GEN:  Well nourished, well developed in no acute distress HEENT: Normal NECK: No JVD; No carotid bruits LYMPHATICS: No lymphadenopathy CARDIAC: Bradycardic, regular, no murmurs, rubs, gallops RESPIRATORY:  Clear to auscultation without rales, wheezing or rhonchi  ABDOMEN: Soft, non-tender, non-distended MUSCULOSKELETAL:  No edema; No deformity  SKIN: Warm and dry NEUROLOGIC:  Alert and oriented x 3 PSYCHIATRIC:  Normal affect   ASSESSMENT:    1. Coronary artery disease involving native coronary artery of native heart  without angina pectoris   2. Diastolic dysfunction   3. Paroxysmal atrial fibrillation (HCC)   4. Primary hypertension      PLAN:    In order of problems listed above:  CAD, LHC 2009 with moderate stenosis in the first diagonal and RCA. denies chest pain.  Continue Eliquis, Lipitor.  Echo 10/979 normal systolic function, EF 55 to 60%.   Grade 2 diastolic dysfunction.  No edema, continue Lasix. paroxysmal atrial fibrillation, currently in sinus brady, heart rate 57.  CHA2DS2-VASc of 4 (age, htn, gender).  Complains of palpitations, fatigue, dizziness of late.  Place cardiac monitor to evaluate  and document A. fib burden.  Follow-up with EP for antiarrhythmic/Tikosyn consideration.   hypertension, blood pressure controlled, continue irbesartan 300 mg daily, HCTZ 12.5 mg daily.    Follow-up in 4 months.    Medication Adjustments/Labs and Tests Ordered: Current medicines are reviewed at length with the patient today.  Concerns regarding medicines are outlined above.  Orders Placed This Encounter  Procedures   Ambulatory referral to Cardiac Electrophysiology   LONG TERM MONITOR (3-14 DAYS)   EKG 12-Lead     No orders of the defined types were placed in this encounter.    Patient Instructions  Medication Instructions:   Your physician recommends that you continue on your current medications as directed. Please refer to the Current Medication list given to you today.   *If you need a refill on your cardiac medications before your next appointment, please call your pharmacy*   Lab Work: None ordered If you have labs (blood work) drawn today and your tests are completely normal, you will receive your results only by: [place] (if you have MyChart) OR A paper copy in the mail If you have any lab test that is abnormal or we need to change your treatment, we will call you to review the results.   Testing/Procedures:  Your physician has recommended that you wear a Zio XT  monitor for 2 weeks.   This monitor is a medical device that records the heart's electrical activity. Doctors most often use these monitors to diagnose arrhythmias. Arrhythmias are problems with the speed or rhythm of the heartbeat. The monitor is a small device applied to your chest. You can wear one while you do your normal daily activities. While wearing this monitor if you have any symptoms to push the button and record what you felt. Once you have worn this monitor for the period of time provider prescribed (Usually 14 days), you will return the monitor device in the postage paid box. Once it is returned they will download the data collected and provide [place] with a report which the provider will then review and we will call you with those results. Important tips:  Avoid showering during the first 24 hours of wearing the monitor. Avoid excessive sweating to help maximize wear time. Do not submerge the device, no hot tubs, and no swimming pools. Keep any lotions or oils away from the patch. After 24 hours you may shower with the patch on. Take brief showers with your back facing the shower head.  Do not remove patch once it has been placed because that will interrupt data and decrease adhesive wear time. Push the button when you have any symptoms and write down what you were feeling. Once you have completed wearing your monitor, remove and place into box which has postage paid and place in your outgoing mailbox.  If for some reason you have misplaced your box then call our office and we can provide another box and/or mail it off for you.      Follow-Up: At [facility], you and your health needs are our priority.  As part of our continuing mission to provide you with exceptional heart care, we have created designated Provider Care Teams.  These Care Teams include your primary Cardiologist (physician) and Advanced Practice Providers (APPs -  Physician Assistants and Nurse Practitioners) who all  work together to provide you with the care you need, when you need it.  We recommend signing up for the patient portal called "MyChart".  Sign up  information is provided on this After Visit Summary.  MyChart is used to connect with patients for Virtual Visits (Telemedicine).  Patients are able to view lab/test results, encounter notes, upcoming appointments, etc.  Non-urgent messages can be sent to your provider as well.   To learn more about what you can do with MyChart, go to [email or web address].    Your next appointment:   4-6 month(s)  The format for your next appointment:   In Person  Provider:   Kate Sable, MD   Other Instructions     Signed, Kate Sable, MD  05/14/2021 12:17 PM    [place]

## 2021-05-20 ENCOUNTER — Other Ambulatory Visit: Payer: Self-pay

## 2021-05-20 MED ORDER — IRBESARTAN 300 MG PO TABS: 300.0000 mg | ORAL_TABLET | Freq: Every day | ORAL | 3 refills | Status: DC

## 2021-05-27 ENCOUNTER — Telehealth: Payer: Self-pay | Admitting: Cardiology

## 2021-05-27 NOTE — Telephone Encounter (Signed)
Patient c/o Palpitations:  High priority if patient c/o lightheadedness, shortness of breath, or chest pain  How long have you had palpitations/irregular HR/ Afib? Are you having the symptoms now? Early this morning about 1215 am  took a metoprolol but did not seem help  Are you currently experiencing lightheadedness, SOB or CP? SOB  Do you have a history of afib (atrial fibrillation) or irregular heart rhythm? yes  Have you checked your BP or HR? (document readings if available): per home device pulse 138 reading possible afib   Are you experiencing any other symptoms? Having trouble catching breath because of hr being so fast

## 2021-05-27 NOTE — Telephone Encounter (Signed)
Spoke with patient. She stated that she took Toprol XL 25 MG around 1215 am when she woke up SOB, feeling her heart racing. She is due for her Toprol XL 12.5 MG (that she has been taking QOD) this AM. She is still feeling SOB with a fast heart rate. I spoke with Dr. Garen Lah and he advised that she increase her Toprol XL dose to 12.5 MG daily. If her SOB and racing heart continues this morning, he advises that she go to the ER.   She had an appointment scheduled with Dr. Quentin Ore for 06/02/21 and recently changed that to 06/16/21 as she is just now sending her Zio monitor in and results would not have been available for the 06/02/21 appointment. Dr. Garen Lah reiterated that it is very important to have this appointment antiarrhythmic/Tikosyn consideration.    Patient stated that she would be willing to go to [facility] office if Dr. Quentin Ore thought it would be beneficial to see her before 06/16/21. Will route to his nurse for consideration.  Patient verbalized understanding and agreed with plan.

## 2021-05-27 NOTE — Telephone Encounter (Signed)
Sent to scheduling to review if earlier appt available.

## 2021-05-28 DIAGNOSIS — I480 Paroxysmal atrial fibrillation: Secondary | ICD-10-CM | POA: Diagnosis not present

## 2021-06-01 DIAGNOSIS — R7989 Other specified abnormal findings of blood chemistry: Secondary | ICD-10-CM | POA: Diagnosis not present

## 2021-06-01 DIAGNOSIS — E236 Other disorders of pituitary gland: Secondary | ICD-10-CM | POA: Diagnosis not present

## 2021-06-01 DIAGNOSIS — E039 Hypothyroidism, unspecified: Secondary | ICD-10-CM | POA: Diagnosis not present

## 2021-06-02 ENCOUNTER — Ambulatory Visit: Payer: Medicare HMO | Admitting: Cardiology

## 2021-06-02 DIAGNOSIS — I480 Paroxysmal atrial fibrillation: Secondary | ICD-10-CM | POA: Diagnosis not present

## 2021-06-08 DIAGNOSIS — R7989 Other specified abnormal findings of blood chemistry: Secondary | ICD-10-CM | POA: Diagnosis not present

## 2021-06-08 DIAGNOSIS — E039 Hypothyroidism, unspecified: Secondary | ICD-10-CM | POA: Diagnosis not present

## 2021-06-08 DIAGNOSIS — E782 Mixed hyperlipidemia: Secondary | ICD-10-CM | POA: Diagnosis not present

## 2021-06-08 DIAGNOSIS — I10 Essential (primary) hypertension: Secondary | ICD-10-CM | POA: Diagnosis not present

## 2021-06-09 ENCOUNTER — Ambulatory Visit: Payer: Medicare HMO | Admitting: Cardiology

## 2021-06-09 ENCOUNTER — Other Ambulatory Visit: Payer: Self-pay

## 2021-06-09 ENCOUNTER — Encounter: Payer: Self-pay | Admitting: Cardiology

## 2021-06-09 VITALS — BP 130/78 | HR 48 | Ht 60.0 in | Wt 151.0 lb

## 2021-06-09 DIAGNOSIS — I480 Paroxysmal atrial fibrillation: Secondary | ICD-10-CM

## 2021-06-09 DIAGNOSIS — R001 Bradycardia, unspecified: Secondary | ICD-10-CM

## 2021-06-09 DIAGNOSIS — H2513 Age-related nuclear cataract, bilateral: Secondary | ICD-10-CM | POA: Diagnosis not present

## 2021-06-09 DIAGNOSIS — I10 Essential (primary) hypertension: Secondary | ICD-10-CM

## 2021-06-09 DIAGNOSIS — H524 Presbyopia: Secondary | ICD-10-CM | POA: Diagnosis not present

## 2021-06-09 DIAGNOSIS — Z01818 Encounter for other preprocedural examination: Secondary | ICD-10-CM

## 2021-06-09 DIAGNOSIS — Z01812 Encounter for preprocedural laboratory examination: Secondary | ICD-10-CM

## 2021-06-09 DIAGNOSIS — H52223 Regular astigmatism, bilateral: Secondary | ICD-10-CM | POA: Diagnosis not present

## 2021-06-09 DIAGNOSIS — H5203 Hypermetropia, bilateral: Secondary | ICD-10-CM | POA: Diagnosis not present

## 2021-06-09 DIAGNOSIS — H43813 Vitreous degeneration, bilateral: Secondary | ICD-10-CM | POA: Diagnosis not present

## 2021-06-09 NOTE — Patient Instructions (Addendum)
Medication Instructions:  Your physician recommends that you continue on your current medications as directed. Please refer to the Current Medication list given to you today.  *If you need a refill on your cardiac medications before your next appointment, please call your pharmacy*   Lab Work: Pre procedure labs 09/02/2021:  BMP & CBC You do NOT need to be fasting.  You can stop by the [employer] office anytime between 7:30 am - 4:30 pm  If you have labs (blood work) drawn today and your tests are completely normal, you will receive your results only by: [place] (if you have MyChart) OR A paper copy in the mail If you have any lab test that is abnormal or we need to change your treatment, we will call you to review the results.   Testing/Procedures: Your physician has requested that you have an echocardiogram. Echocardiography is a painless test that uses sound waves to create images of your heart. It provides your doctor with information about the size and shape of your heart and how well your heart's chambers and valves are working. This procedure takes approximately one hour. There are no restrictions for this procedure.  Your physician has requested that you have cardiac CT within 7 days PRIOR to your ablation. Cardiac computed tomography (CT) is a painless test that uses an x-ray machine to take clear, detailed pictures of your heart.  Please follow instruction below located under "other instructions". You will get a call from our office to schedule the date for this test.  Your physician has recommended that you have an ablation. Catheter ablation is a medical procedure used to treat some cardiac arrhythmias (irregular heartbeats). During catheter ablation, a long, thin, flexible tube is put into a blood vessel in your groin (upper thigh), or neck. This tube is called an ablation catheter. It is then guided to your heart through the blood vessel. Radio frequency waves destroy  small areas of heart tissue where abnormal heartbeats may cause an arrhythmia to start. Please follow instruction below located under "other instructions".   Follow-Up: At [facility], you and your health needs are our priority.  As part of our continuing mission to provide you with exceptional heart care, we have created designated Provider Care Teams.  These Care Teams include your primary Cardiologist (physician) and Advanced Practice Providers (APPs -  Physician Assistants and Nurse Practitioners) who all work together to provide you with the care you need, when you need it.  Your next appointment:   1 month(s) after your ablation  The format for your next appointment:   In Person  Provider:   AFib clinic   Thank you for choosing [facility]!!      Other Instructions   CT INSTRUCTIONS Your cardiac CT will be scheduled at:  [facility] [address] [place], [place] [address] [phone]  Please arrive at the [facility] main entrance of [facility] 30 minutes prior to test start time. Proceed to the [facility] Radiology Department (first floor) to check-in and test prep.   Please follow these instructions carefully (unless otherwise directed):  On the Night Before the Test: Be sure to Drink plenty of water. Do not consume any caffeinated/decaffeinated beverages or chocolate 12 hours prior to your test. Do not take any antihistamines 12 hours prior to your test.  On the Day of the Test: Drink plenty of water until 1 hour prior to the test. Do not eat any food 4  hours prior to the test. You may take your regular medications prior to the test.  HOLD Furosemide/Hydrochlorothiazide morning of the test. FEMALES- please wear underwire-free bra if available      After the Test: Drink plenty of water. After receiving IV contrast, you may experience a mild flushed feeling. This is normal. On occasion, you may experience a mild rash up to 24  hours after the test. This is not dangerous. If this occurs, you can take Benadryl 25 mg and increase your fluid intake. If you experience trouble breathing, this can be serious. If it is severe call 911 IMMEDIATELY. If it is mild, please call our office. If you take any of these medications: Glipizide/Metformin, Avandament, Glucavance, please do not take 48 hours after completing test unless otherwise instructed.   Once we have confirmed authorization from your insurance company, we will call you to set up a date and time for your test. Based on how quickly your insurance processes prior authorizations requests, please allow up to 4 weeks to be contacted for scheduling your Cardiac CT appointment. Be advised that routine Cardiac CT appointments could be scheduled as many as 8 weeks after your provider has ordered it.  For non-scheduling related questions, please contact the cardiac imaging nurse navigator should you have any questions/concerns: Marchia Bond, Cardiac Imaging Nurse Navigator Gordy Clement, Cardiac Imaging Nurse Navigator  Heart and Vascular Services Direct Office Dial: [phone]   For scheduling needs, including cancellations and rescheduling, please call [place], [phone].      Electrophysiology/Ablation Procedure Instructions   You are scheduled for a(n)  ablation on 09/21/2021 with Dr. Freddie Breech.   1.   Pre procedure testing-             A.  LAB WORK --- On 09/02/2020 for your pre procedure blood work.     On the day of your procedure 09/21/2021 you will go to [facility] [phone]) at 5:30 am.  Dennis Bast will go to the main entrance A [employer]) and enter where the [employer] are.  Your driver will drop you off and you will head down the hallway to ADMITTING.  You may have one support person come in to the hospital with you.  They will be asked to wait in the waiting room. It is OK to have someone drop you off and come back when  you are ready to be discharged.   3.   Do not eat or drink after midnight prior to your procedure.   4.   On the morning of your procedure do NOT take any medication. Do not miss any doses of your blood thinner prior to the morning of your procedure or your procedure will need to be rescheduled.   5.  Plan for an overnight stay but you may be discharged after your procedure, if you use your phone frequently bring your phone charger. If you are discharged after your procedure you will need someone to drive you home and be with you for 24 hours after your procedure.   6. You will follow up with the AFIB clinic 4 weeks after your procedure.  You will follow up with Dr. Quentin Ore  3 months after your procedure.  These appointments will be made for you.   7. FYI: For your safety, and to allow [place] to monitor your vital signs accurately during the surgery/procedure we request that if you have artificial nails, gel coating, SNS etc. Please have those  removed prior to your surgery/procedure. Not having the nail coverings /polish removed may result in cancellation or delay of your surgery/procedure.  * If you have ANY questions please call the office [phone] and ask for [facility] or send me a MyChart message   * Occasionally, EP Studies and ablations can become lengthy.  Please make your family aware of this before your procedure starts.  Average time ranges from 2-8 hours for EP studies/ablations.  Your physician will call your family after the procedure with the results.

## 2021-06-09 NOTE — Progress Notes (Signed)
Electrophysiology Office Follow up Visit Note:    Date:  06/09/2021   ID:  Charlene Coleman, DOB 1940/12/22, MRN [id number]  PCP:  Dion Body, MD  [facility] Cardiologist:  Kate Sable, MD  [facility] Electrophysiologist:  Vickie Epley, MD    Interval History:    Charlene Coleman is a 80 y.o. female who presents for a follow up visit. They were last seen in clinic March 17, 2021 for her paroxysmal atrial fibrillation.  Since I last saw her she had a highly symptomatic episode of atrial fibrillation while wearing a ZIO monitor.  The episode lasted all night.  She was unable to sleep within the next day she experienced extreme fatigue that lasted about 24 hours.  She is interested in a more durable rhythm control strategy.  She tells me she has been fairly active.  The most fatigue she is had recently was after that episode of atrial fibrillation.     Past Medical History:  Diagnosis Date   (HFpEF) heart failure with preserved ejection fraction (HCC)    A-fib (HCC)    Borderline diabetes    Breast cancer ([place]) 2015   Left side - radiation - lumpectomy   CAD (coronary artery disease)    DCIS (ductal carcinoma in situ)    Empty sella syndrome (HCC)    GERD (gastroesophageal reflux disease)    Gout    Hyperlipidemia    Hypertension    Personal history of radiation therapy    Skin cancer 2011    Past Surgical History:  Procedure Laterality Date   ABDOMINAL HYSTERECTOMY     BREAST BIOPSY Left 2015   positive   BREAST LUMPECTOMY Left 09/23/13   CHOLECYSTECTOMY     GASTRIC FUNDOPLICATION     left lumpectomy      Current Medications: Current Meds  Medication Sig   allopurinol (ZYLOPRIM) 300 MG tablet Take 300 mg by mouth daily.    apixaban (ELIQUIS) 5 MG TABS tablet TAKE 1 TABLET TWICE DAILY   atorvastatin (LIPITOR) 20 MG tablet Take 20 mg by mouth daily at 6 PM.    Calcium Carbonate-Vitamin D 600-400 MG-UNIT per tablet Take 1 tablet by mouth 3 (three)  times a week.    gabapentin (NEURONTIN) 100 MG capsule Take two capsules (200 mg) in the am & three capsules (300 mg) in the pm   hydrochlorothiazide (MICROZIDE) 12.5 MG capsule Take 1 capsule (12.5 mg total) by mouth daily.   irbesartan (AVAPRO) 300 MG tablet Take 1 tablet (300 mg total) by mouth daily.   levothyroxine (SYNTHROID) 112 MCG tablet Take 88 mcg by mouth daily before breakfast.   methocarbamol (ROBAXIN) 500 MG tablet Take 250-500 mg by mouth 2 (two) times daily as needed.   metoprolol succinate (TOPROL XL) 25 MG 24 hr tablet Take 0.5 tablets (12.5 mg total) by mouth every other day.   oxybutynin (DITROPAN-XL) 5 MG 24 hr tablet Take 5 mg by mouth in the morning.     Allergies:   Enalapril and Tylenol [acetaminophen]   Social History   Socioeconomic History   Marital status: Married    Spouse name: Not on file   Number of children: Not on file   Years of education: Not on file   Highest education level: Not on file  Occupational History   Not on file  Tobacco Use   Smoking status: Never   Smokeless tobacco: Never  Vaping Use   Vaping Use: Never used  Substance  and Sexual Activity   Alcohol use: No   Drug use: No   Sexual activity: Not on file  Other Topics Concern   Not on file  Social History Narrative   Not on file   Social Determinants of Health   Financial Resource Strain: Not on file  Food Insecurity: Not on file  Transportation Needs: Not on file  Physical Activity: Not on file  Stress: Not on file  Social Connections: Not on file     Family History: The patient's family history includes Breast cancer in her cousin; Breast cancer (age of onset: 34) in her maternal aunt.  ROS:   Please see the history of present illness.    All other systems reviewed and are negative.  EKGs/Labs/Other Studies Reviewed:    The following studies were reviewed today:  June 04, 2021 ZIO monitor personally reviewed Paroxysmal atrial fibrillation with a 2%  burden.  Longest pause was 3.6 seconds.  Patient triggered episodes corresponded to atrial fibrillation.  EKG:  The ekg ordered today demonstrates sinus rhythm.  PR interval 198 ms.  Ventricular rate 48 bpm.  QTc 412 ms.  Recent Labs: 07/29/2020: Hemoglobin 13.1; Platelets 258 08/03/2020: TSH 15.000 08/12/2020: BUN 18; Creatinine, Ser 1.15; Potassium 3.6; Sodium 139  Recent Lipid Panel No results found for: CHOL, TRIG, HDL, CHOLHDL, VLDL, LDLCALC, LDLDIRECT  Physical Exam:    VS:  BP 130/78   Pulse (!) 48   Ht 5' (1.524 m)   Wt 151 lb (68.5 kg)   SpO2 98%   BMI 29.49 kg/m     Wt Readings from Last 3 Encounters:  06/09/21 151 lb (68.5 kg)  05/14/21 151 lb (68.5 kg)  03/17/21 154 lb (69.9 kg)     GEN:  Well nourished, well developed in no acute distress HEENT: Normal NECK: No JVD; No carotid bruits LYMPHATICS: No lymphadenopathy CARDIAC: RRR, no murmurs, rubs, gallops RESPIRATORY:  Clear to auscultation without rales, wheezing or rhonchi  ABDOMEN: Soft, non-tender, non-distended MUSCULOSKELETAL:  No edema; No deformity  SKIN: Warm and dry NEUROLOGIC:  Alert and oriented x 3 PSYCHIATRIC:  Normal affect        ASSESSMENT:    1. Paroxysmal atrial fibrillation (HCC)   2. Primary hypertension   3. Sinus bradycardia   4. Pre-procedure lab exam    PLAN:    In order of problems listed above:  1. Paroxysmal atrial fibrillation (HCC) Highly symptomatic paroxysms of atrial fibrillation.  Most recent episode was captured on ZIO monitor.  On Eliquis for stroke prophylaxis.  Given the highly symptomatic nature of her paroxysms, she is interested in a more durable rhythm control strategy.  We discussed antiarrhythmic drug therapy during today's appointment including Tikosyn, amiodarone.  I think given her resting bradycardia, she is unlikely to tolerate antiarrhythmic drug therapy without a permanent pacemaker implanted.  We also discussed catheter ablation during today's  appointment including the risk, recovery and likelihood of success.  After an extensive discussion about the pros and cons of each option she would like to proceed with a catheter ablation.  Risk, benefits, and alternatives to EP study and radiofrequency ablation for afib were also discussed in detail today. These risks include but are not limited to stroke, bleeding, vascular damage, tamponade, perforation, damage to the esophagus, lungs, and other structures, pulmonary vein stenosis, worsening renal function, and death. The patient understands these risk and wishes to proceed.  We will therefore proceed with catheter ablation at the next available time.  Carto,  ICE, anesthesia are requested for the procedure.  Will also obtain CT PV protocol prior to the procedure to exclude LAA thrombus and further evaluate atrial anatomy.  She will also need an echocardiogram prior to the procedure.  2. Primary hypertension Controlled.  Continue current regimen.  3. Sinus bradycardia Limits our ability to use antiarrhythmic drug therapy or nodal blockers.  We discussed the possibility that she ages she would require permanent pacemaker to help manage her arrhythmia.  4. Pre-procedure lab exam   Total time spent with patient today 45 minutes. This includes reviewing records, evaluating the patient and coordinating care.   Medication Adjustments/Labs and Tests Ordered: Current medicines are reviewed at length with the patient today.  Concerns regarding medicines are outlined above.  Orders Placed This Encounter  Procedures   CT CARDIAC MORPH/PULM VEIN W/CM&W/O CA SCORE   Basic metabolic panel   CBC   EKG 12-Lead   ECHOCARDIOGRAM COMPLETE   No orders of the defined types were placed in this encounter.    Signed, Lars Mage, MD, [facility], [facility] 06/09/2021 8:14 PM    Electrophysiology [facility]

## 2021-06-10 DIAGNOSIS — Z03818 Encounter for observation for suspected exposure to other biological agents ruled out: Secondary | ICD-10-CM | POA: Diagnosis not present

## 2021-06-10 DIAGNOSIS — Z20822 Contact with and (suspected) exposure to covid-19: Secondary | ICD-10-CM | POA: Diagnosis not present

## 2021-06-15 DIAGNOSIS — Z1389 Encounter for screening for other disorder: Secondary | ICD-10-CM | POA: Diagnosis not present

## 2021-06-15 DIAGNOSIS — I4891 Unspecified atrial fibrillation: Secondary | ICD-10-CM | POA: Diagnosis not present

## 2021-06-15 DIAGNOSIS — Z0000 Encounter for general adult medical examination without abnormal findings: Secondary | ICD-10-CM | POA: Diagnosis not present

## 2021-06-16 ENCOUNTER — Ambulatory Visit: Payer: Medicare HMO | Admitting: Cardiology

## 2021-06-23 ENCOUNTER — Other Ambulatory Visit: Payer: Self-pay | Admitting: *Deleted

## 2021-06-23 MED ORDER — FUROSEMIDE 20 MG PO TABS: 20.0000 mg | ORAL_TABLET | Freq: Every day | ORAL | 0 refills | Status: DC

## 2021-06-29 ENCOUNTER — Ambulatory Visit (HOSPITAL_COMMUNITY): Payer: Medicare HMO | Attending: Cardiology

## 2021-06-29 ENCOUNTER — Other Ambulatory Visit: Payer: Self-pay

## 2021-06-29 DIAGNOSIS — I480 Paroxysmal atrial fibrillation: Secondary | ICD-10-CM

## 2021-06-29 MED ORDER — PERFLUTREN LIPID MICROSPHERE
2.0000 mL | INTRAVENOUS | Status: AC | PRN
Start: 2021-06-29 — End: 2021-06-29
  Administered 2021-06-29: 2 mL via INTRAVENOUS

## 2021-06-30 LAB — ECHOCARDIOGRAM COMPLETE
Area-P 1/2: 4.1 cm2
S' Lateral: 2.1 cm

## 2021-07-01 ENCOUNTER — Other Ambulatory Visit: Payer: Self-pay

## 2021-07-01 MED ORDER — FUROSEMIDE 20 MG PO TABS
20.0000 mg | ORAL_TABLET | Freq: Every day | ORAL | 0 refills | Status: DC
Start: 2021-07-01 — End: 2021-10-15

## 2021-07-12 ENCOUNTER — Other Ambulatory Visit: Payer: Self-pay

## 2021-07-12 MED ORDER — HYDROCHLOROTHIAZIDE 12.5 MG PO CAPS: 12.5000 mg | ORAL_CAPSULE | Freq: Every day | ORAL | 0 refills | Status: DC

## 2021-08-10 DIAGNOSIS — H25043 Posterior subcapsular polar age-related cataract, bilateral: Secondary | ICD-10-CM | POA: Diagnosis not present

## 2021-08-10 DIAGNOSIS — H2513 Age-related nuclear cataract, bilateral: Secondary | ICD-10-CM | POA: Diagnosis not present

## 2021-08-10 DIAGNOSIS — H18413 Arcus senilis, bilateral: Secondary | ICD-10-CM | POA: Diagnosis not present

## 2021-08-10 DIAGNOSIS — H25013 Cortical age-related cataract, bilateral: Secondary | ICD-10-CM | POA: Diagnosis not present

## 2021-08-10 DIAGNOSIS — H2511 Age-related nuclear cataract, right eye: Secondary | ICD-10-CM | POA: Diagnosis not present

## 2021-08-17 ENCOUNTER — Other Ambulatory Visit: Payer: Self-pay | Admitting: Family Medicine

## 2021-08-17 DIAGNOSIS — Z1231 Encounter for screening mammogram for malignant neoplasm of breast: Secondary | ICD-10-CM

## 2021-09-02 ENCOUNTER — Other Ambulatory Visit: Payer: Medicare HMO

## 2021-09-02 ENCOUNTER — Other Ambulatory Visit: Payer: Self-pay

## 2021-09-02 DIAGNOSIS — I480 Paroxysmal atrial fibrillation: Secondary | ICD-10-CM | POA: Diagnosis not present

## 2021-09-02 DIAGNOSIS — Z01812 Encounter for preprocedural laboratory examination: Secondary | ICD-10-CM

## 2021-09-02 LAB — BASIC METABOLIC PANEL
BUN/Creatinine Ratio: 17 (ref 12–28)
BUN: 18 mg/dL (ref 8–27)
CO2: 23 mmol/L (ref 20–29)
Calcium: 9.3 mg/dL (ref 8.7–10.3)
Chloride: 103 mmol/L (ref 96–106)
Creatinine, Ser: 1.05 mg/dL — ABNORMAL HIGH (ref 0.57–1.00)
Glucose: 83 mg/dL (ref 70–99)
Potassium: 3.8 mmol/L (ref 3.5–5.2)
Sodium: 139 mmol/L (ref 134–144)
eGFR: 54 mL/min/{1.73_m2} — ABNORMAL LOW (ref >59)

## 2021-09-02 LAB — CBC
Hematocrit: 33.9 % — ABNORMAL LOW (ref 34.0–46.6)
Hemoglobin: 11.5 g/dL (ref 11.1–15.9)
MCH: 30.6 pg (ref 26.6–33.0)
MCHC: 33.9 g/dL (ref 31.5–35.7)
MCV: 90 fL (ref 79–97)
Platelets: 234 10*3/uL (ref 150–450)
RBC: 3.76 x10E6/uL — ABNORMAL LOW (ref 3.77–5.28)
RDW: 13.9 % (ref 11.7–15.4)
WBC: 7.4 10*3/uL (ref 3.4–10.8)

## 2021-09-10 ENCOUNTER — Telehealth (HOSPITAL_COMMUNITY): Payer: Self-pay | Admitting: *Deleted

## 2021-09-10 NOTE — Telephone Encounter (Signed)
Reaching out to patient to offer assistance regarding upcoming cardiac imaging study; pt verbalizes understanding of appt date/time, parking situation and where to check in, pre-test NPO status and verified current allergies; name and call back number provided for further questions should they arise  Gordy Clement RN Navigator Card[facility] [facility] [phone] office [phone] cell  Patient aware to arrive at 1pm for her 1:30pm scan.

## 2021-09-14 ENCOUNTER — Ambulatory Visit (HOSPITAL_COMMUNITY)
Admission: RE | Admit: 2021-09-14 | Discharge: 2021-09-14 | Disposition: A | Discharge status: Home / Self Care | Payer: Medicare HMO | Source: Ambulatory Visit | Attending: Cardiology | Admitting: Cardiology

## 2021-09-14 ENCOUNTER — Other Ambulatory Visit: Payer: Self-pay

## 2021-09-14 DIAGNOSIS — M8588 Other specified disorders of bone density and structure, other site: Secondary | ICD-10-CM | POA: Diagnosis not present

## 2021-09-14 DIAGNOSIS — I480 Paroxysmal atrial fibrillation: Secondary | ICD-10-CM | POA: Diagnosis not present

## 2021-09-14 MED ORDER — IOHEXOL 350 MG/ML SOLN
95.0000 mL | Freq: Once | INTRAVENOUS | Status: AC | PRN
  Administered 2021-09-14: 95 mL via INTRAVENOUS

## 2021-09-20 ENCOUNTER — Ambulatory Visit
Admission: RE | Admit: 2021-09-20 | Discharge: 2021-09-20 | Disposition: A | Discharge status: Home / Self Care | Payer: Medicare HMO | Source: Ambulatory Visit | Attending: Family Medicine | Admitting: Family Medicine

## 2021-09-20 ENCOUNTER — Other Ambulatory Visit: Payer: Self-pay

## 2021-09-20 DIAGNOSIS — Z1231 Encounter for screening mammogram for malignant neoplasm of breast: Secondary | ICD-10-CM | POA: Insufficient documentation

## 2021-09-20 NOTE — Pre-Procedure Instructions (Signed)
Instructed patient on the following items: Arrival time 0530 Nothing to eat or drink after midnight No meds AM of procedure Responsible person to drive you home and stay with you for 24 hrs  Have you missed any doses of anti-coagulant Eliquis- hasn't missed any doses    

## 2021-09-21 ENCOUNTER — Encounter (HOSPITAL_COMMUNITY)
Admission: RE | Disposition: A | Discharge status: Home / Self Care | Payer: Medicare HMO | Source: Ambulatory Visit | Attending: Cardiology

## 2021-09-21 ENCOUNTER — Ambulatory Visit (HOSPITAL_BASED_OUTPATIENT_CLINIC_OR_DEPARTMENT_OTHER): Payer: Medicare HMO | Admitting: Critical Care Medicine

## 2021-09-21 ENCOUNTER — Ambulatory Visit (HOSPITAL_COMMUNITY)
Admission: RE | Admit: 2021-09-21 | Discharge: 2021-09-21 | Disposition: A | Discharge status: Home / Self Care | Payer: Medicare HMO | Source: Ambulatory Visit | Attending: Cardiology | Admitting: Cardiology

## 2021-09-21 ENCOUNTER — Ambulatory Visit (HOSPITAL_COMMUNITY): Payer: Medicare HMO | Admitting: Critical Care Medicine

## 2021-09-21 ENCOUNTER — Encounter (HOSPITAL_COMMUNITY): Payer: Self-pay | Admitting: Cardiology

## 2021-09-21 DIAGNOSIS — I509 Heart failure, unspecified: Secondary | ICD-10-CM

## 2021-09-21 DIAGNOSIS — Z79899 Other long term (current) drug therapy: Secondary | ICD-10-CM | POA: Insufficient documentation

## 2021-09-21 DIAGNOSIS — K219 Gastro-esophageal reflux disease without esophagitis: Secondary | ICD-10-CM | POA: Insufficient documentation

## 2021-09-21 DIAGNOSIS — I5032 Chronic diastolic (congestive) heart failure: Secondary | ICD-10-CM | POA: Diagnosis not present

## 2021-09-21 DIAGNOSIS — I110 Hypertensive heart disease with heart failure: Secondary | ICD-10-CM

## 2021-09-21 DIAGNOSIS — R001 Bradycardia, unspecified: Secondary | ICD-10-CM | POA: Insufficient documentation

## 2021-09-21 DIAGNOSIS — I480 Paroxysmal atrial fibrillation: Secondary | ICD-10-CM | POA: Insufficient documentation

## 2021-09-21 DIAGNOSIS — Z7901 Long term (current) use of anticoagulants: Secondary | ICD-10-CM | POA: Insufficient documentation

## 2021-09-21 DIAGNOSIS — I2510 Atherosclerotic heart disease of native coronary artery without angina pectoris: Secondary | ICD-10-CM

## 2021-09-21 DIAGNOSIS — I4891 Unspecified atrial fibrillation: Secondary | ICD-10-CM

## 2021-09-21 HISTORY — PX: ATRIAL FIBRILLATION ABLATION: EP1191

## 2021-09-21 SURGERY — ATRIAL FIBRILLATION ABLATION
Anesthesia: General

## 2021-09-21 MED ORDER — ISOPROTERENOL HCL 0.2 MG/ML IJ SOLN
INTRAMUSCULAR | Status: AC
  Filled 2021-09-21: qty 5

## 2021-09-21 MED ORDER — LIDOCAINE 2% (20 MG/ML) 5 ML SYRINGE
INTRAMUSCULAR | Status: DC | PRN
  Administered 2021-09-21: 40 mg via INTRAVENOUS

## 2021-09-21 MED ORDER — ONDANSETRON HCL 4 MG/2ML IJ SOLN
INTRAMUSCULAR | Status: DC | PRN
Start: 2021-09-21 — End: 2021-09-21
  Administered 2021-09-21: 4 mg via INTRAVENOUS

## 2021-09-21 MED ORDER — ACETAMINOPHEN 325 MG PO TABS: 325.0000 mg | ORAL_TABLET | ORAL | Status: DC | PRN

## 2021-09-21 MED ORDER — HEPARIN SODIUM (PORCINE) 1000 UNIT/ML IJ SOLN
INTRAMUSCULAR | Status: DC | PRN
  Administered 2021-09-21: 4000 [IU] via INTRAVENOUS
  Administered 2021-09-21: 10000 [IU] via INTRAVENOUS

## 2021-09-21 MED ORDER — PANTOPRAZOLE SODIUM 40 MG PO TBEC: 40.0000 mg | DELAYED_RELEASE_TABLET | Freq: Every day | ORAL | 0 refills | Status: DC

## 2021-09-21 MED ORDER — METOPROLOL SUCCINATE 12.5 MG HALF TABLET
12.5000 mg | ORAL_TABLET | Freq: Every day | ORAL | Status: DC
  Administered 2021-09-21: 12.5 mg via ORAL
  Filled 2021-09-21: qty 1

## 2021-09-21 MED ORDER — SODIUM CHLORIDE 0.9% FLUSH: 3.0000 mL | INTRAVENOUS | Status: DC | PRN

## 2021-09-21 MED ORDER — HEPARIN (PORCINE) IN NACL 1000-0.9 UT/500ML-% IV SOLN
INTRAVENOUS | Status: AC
  Filled 2021-09-21: qty 500

## 2021-09-21 MED ORDER — COLCHICINE 0.6 MG PO TABS: 0.6000 mg | ORAL_TABLET | Freq: Two times a day (BID) | ORAL | 0 refills | Status: DC

## 2021-09-21 MED ORDER — ACETAMINOPHEN 10 MG/ML IV SOLN: 1000.0000 mg | Freq: Once | INTRAVENOUS | Status: DC | PRN

## 2021-09-21 MED ORDER — AMISULPRIDE (ANTIEMETIC) 5 MG/2ML IV SOLN: 10.0000 mg | Freq: Once | INTRAVENOUS | Status: DC | PRN

## 2021-09-21 MED ORDER — OXYCODONE HCL 5 MG/5ML PO SOLN: 5.0000 mg | Freq: Once | ORAL | Status: DC | PRN

## 2021-09-21 MED ORDER — FENTANYL CITRATE (PF) 100 MCG/2ML IJ SOLN
INTRAMUSCULAR | Status: DC | PRN
  Administered 2021-09-21: 100 ug via INTRAVENOUS

## 2021-09-21 MED ORDER — ROCURONIUM BROMIDE 10 MG/ML (PF) SYRINGE
PREFILLED_SYRINGE | INTRAVENOUS | Status: DC | PRN
  Administered 2021-09-21: 60 mg via INTRAVENOUS

## 2021-09-21 MED ORDER — HEPARIN SODIUM (PORCINE) 1000 UNIT/ML IJ SOLN
INTRAMUSCULAR | Status: AC
  Filled 2021-09-21: qty 10

## 2021-09-21 MED ORDER — DEXTROSE 5 % IV SOLN
INTRAVENOUS | Status: DC | PRN
  Administered 2021-09-21: 2 ug/min via INTRAVENOUS

## 2021-09-21 MED ORDER — SODIUM CHLORIDE 0.9 % IV SOLN: INTRAVENOUS | Status: DC

## 2021-09-21 MED ORDER — SUGAMMADEX SODIUM 200 MG/2ML IV SOLN
INTRAVENOUS | Status: DC | PRN
  Administered 2021-09-21: 140 mg via INTRAVENOUS

## 2021-09-21 MED ORDER — ACETAMINOPHEN 160 MG/5ML PO SOLN: 325.0000 mg | ORAL | Status: DC | PRN

## 2021-09-21 MED ORDER — PHENYLEPHRINE HCL (PRESSORS) 10 MG/ML IV SOLN
INTRAVENOUS | Status: DC | PRN
  Administered 2021-09-21 (×3): 40 ug via INTRAVENOUS
  Administered 2021-09-21: 80 ug via INTRAVENOUS
  Administered 2021-09-21 (×3): 40 ug via INTRAVENOUS

## 2021-09-21 MED ORDER — DEXAMETHASONE SODIUM PHOSPHATE 10 MG/ML IJ SOLN
INTRAMUSCULAR | Status: DC | PRN
  Administered 2021-09-21: 4 mg via INTRAVENOUS

## 2021-09-21 MED ORDER — PANTOPRAZOLE SODIUM 40 MG PO TBEC
40.0000 mg | DELAYED_RELEASE_TABLET | Freq: Every day | ORAL | Status: DC
Start: 2021-09-21 — End: 2021-09-21
  Administered 2021-09-21: 40 mg via ORAL
  Filled 2021-09-21: qty 1

## 2021-09-21 MED ORDER — PROTAMINE SULFATE 10 MG/ML IV SOLN
INTRAVENOUS | Status: DC | PRN
  Administered 2021-09-21: 35 mg via INTRAVENOUS

## 2021-09-21 MED ORDER — HEPARIN (PORCINE) IN NACL 1000-0.9 UT/500ML-% IV SOLN
INTRAVENOUS | Status: DC | PRN
  Administered 2021-09-21 (×4): 500 mL

## 2021-09-21 MED ORDER — OXYCODONE HCL 5 MG PO TABS: 5.0000 mg | ORAL_TABLET | Freq: Once | ORAL | Status: DC | PRN

## 2021-09-21 MED ORDER — SODIUM CHLORIDE 0.9% FLUSH: 3.0000 mL | Freq: Two times a day (BID) | INTRAVENOUS | Status: DC

## 2021-09-21 MED ORDER — APIXABAN 5 MG PO TABS
5.0000 mg | ORAL_TABLET | Freq: Two times a day (BID) | ORAL | Status: DC
  Administered 2021-09-21: 5 mg via ORAL
  Filled 2021-09-21: qty 1

## 2021-09-21 MED ORDER — ONDANSETRON HCL 4 MG/2ML IJ SOLN: 4.0000 mg | Freq: Four times a day (QID) | INTRAMUSCULAR | Status: DC | PRN

## 2021-09-21 MED ORDER — PROMETHAZINE HCL 25 MG/ML IJ SOLN: 6.2500 mg | INTRAMUSCULAR | Status: DC | PRN

## 2021-09-21 MED ORDER — COLCHICINE 0.6 MG PO TABS
0.6000 mg | ORAL_TABLET | Freq: Two times a day (BID) | ORAL | Status: DC
Start: 2021-09-21 — End: 2021-09-21
  Administered 2021-09-21: 0.6 mg via ORAL
  Filled 2021-09-21: qty 1

## 2021-09-21 MED ORDER — SODIUM CHLORIDE 0.9 % IV SOLN: 250.0000 mL | INTRAVENOUS | Status: DC | PRN

## 2021-09-21 MED ORDER — PROPOFOL 10 MG/ML IV BOLUS
INTRAVENOUS | Status: DC | PRN
  Administered 2021-09-21: 130 mg via INTRAVENOUS

## 2021-09-21 MED ORDER — PHENYLEPHRINE HCL-NACL 20-0.9 MG/250ML-% IV SOLN
INTRAVENOUS | Status: DC | PRN
  Administered 2021-09-21: 25 ug/min via INTRAVENOUS
  Administered 2021-09-21: 50 ug/min via INTRAVENOUS

## 2021-09-21 MED ORDER — FENTANYL CITRATE (PF) 100 MCG/2ML IJ SOLN: 25.0000 ug | INTRAMUSCULAR | Status: DC | PRN

## 2021-09-21 MED ORDER — HEPARIN SODIUM (PORCINE) 1000 UNIT/ML IJ SOLN
INTRAMUSCULAR | Status: DC | PRN
  Administered 2021-09-21: 1000 [IU] via INTRAVENOUS

## 2021-09-21 SURGICAL SUPPLY — 18 items
CATH 8FR REPROCESSED SOUNDSTAR (CATHETERS) ×2 IMPLANT
CATH 8FR SOUNDSTAR REPROCESSED (CATHETERS) IMPLANT
CATH OCTARAY 2.0 F 3-3-3-3-3 (CATHETERS) ×1 IMPLANT
CATH S CIRCA THERM PROBE 10F (CATHETERS) ×1 IMPLANT
CATH SMTCH THERMOCOOL SF DF (CATHETERS) ×1 IMPLANT
CATH WEB BI DIR CSDF CRV REPRO (CATHETERS) ×1 IMPLANT
CLOSURE PERCLOSE PROSTYLE (VASCULAR PRODUCTS) ×3 IMPLANT
COVER SWIFTLINK CONNECTOR (BAG) ×2 IMPLANT
PACK EP LATEX FREE (CUSTOM PROCEDURE TRAY) ×2
PACK EP LF (CUSTOM PROCEDURE TRAY) ×1 IMPLANT
PAD DEFIB RADIO PHYSIO CONN (PAD) ×2 IMPLANT
PATCH CARTO3 (PAD) ×1 IMPLANT
SHEATH BAYLIS TRANSSEPTAL 98CM (NEEDLE) ×1 IMPLANT
SHEATH CARTO VIZIGO SM CVD (SHEATH) ×1 IMPLANT
SHEATH PINNACLE 8F 10CM (SHEATH) ×2 IMPLANT
SHEATH PINNACLE 9F 10CM (SHEATH) ×1 IMPLANT
SHEATH PROBE COVER 6X72 (BAG) ×1 IMPLANT
TUBING SMART ABLATE COOLFLOW (TUBING) ×1 IMPLANT

## 2021-09-21 NOTE — Anesthesia Procedure Notes (Signed)
Procedure Name: Intubation Date/Time: 09/21/2021 7:50 AM Performed by: Wilburn Cornelia, CRNA Pre-anesthesia Checklist: Patient identified, Emergency Drugs available, Suction available, Patient being monitored and Timeout performed Patient Re-evaluated:Patient Re-evaluated prior to induction Oxygen Delivery Method: Circle system utilized Preoxygenation: Pre-oxygenation with 100% oxygen Induction Type: IV induction Ventilation: Mask ventilation without difficulty Laryngoscope Size: Mac Grade View: Grade I Tube type: Oral Tube size: 7.0 mm Number of attempts: 1 Airway Equipment and Method: Stylet Placement Confirmation: ETT inserted through vocal cords under direct vision, positive ETCO2, CO2 detector and breath sounds checked- equal and bilateral Secured at: 21 cm Tube secured with: Tape Dental Injury: Teeth and Oropharynx as per pre-operative assessment

## 2021-09-21 NOTE — Discharge Instructions (Signed)

## 2021-09-21 NOTE — Anesthesia Postprocedure Evaluation (Signed)
Anesthesia Post Note  Patient: REX MAGEE  Procedure(s) Performed: ATRIAL FIBRILLATION ABLATION     Patient location during evaluation: PACU Anesthesia Type: General Level of consciousness: awake and alert Pain management: pain level controlled Vital Signs Assessment: post-procedure vital signs reviewed and stable Respiratory status: spontaneous breathing, nonlabored ventilation, respiratory function stable and patient connected to nasal cannula oxygen Cardiovascular status: blood pressure returned to baseline and stable Postop Assessment: no apparent nausea or vomiting Anesthetic complications: no   No notable events documented.  Last Vitals:  Vitals:   09/21/21 1355 09/21/21 1415  BP:  (!) 165/63  Pulse: (!) 56 (!) 55  Resp: 17 13  Temp:    SpO2: 96% 99%    Last Pain:  Vitals:   09/21/21 1100  TempSrc:   PainSc: 0-No pain                 Effie Berkshire

## 2021-09-21 NOTE — H&P (Signed)
Electrophysiology Office Follow up Visit Note:     Date:  09/21/2021    ID:  Charlene Coleman, DOB Aug 21, 1940, MRN [id number]   PCP:  Dion Body, MD          [facility] Cardiologist:  Kate Sable, MD  [facility] Electrophysiologist:  Vickie Epley, MD      Interval History:     Charlene Coleman is a 81 y.o. female who presents for a follow up visit. They were last seen in clinic March 17, 2021 for her paroxysmal atrial fibrillation.  Since I last saw her she had a highly symptomatic episode of atrial fibrillation while wearing a ZIO monitor.  The episode lasted all night.  She was unable to sleep within the next day she experienced extreme fatigue that lasted about 24 hours.  She is interested in a more durable rhythm control strategy.   She tells me she has been fairly active.  The most fatigue she is had recently was after that episode of atrial fibrillation.    Doing well today.       Objective          Past Medical History:  Diagnosis Date   (HFpEF) heart failure with preserved ejection fraction (HCC)     A-fib (HCC)     Borderline diabetes     Breast cancer ([place]) 2015    Left side - radiation - lumpectomy   CAD (coronary artery disease)     DCIS (ductal carcinoma in situ)     Empty sella syndrome (HCC)     GERD (gastroesophageal reflux disease)     Gout     Hyperlipidemia     Hypertension     Personal history of radiation therapy     Skin cancer 2011           Past Surgical History:  Procedure Laterality Date   ABDOMINAL HYSTERECTOMY       BREAST BIOPSY Left 2015    positive   BREAST LUMPECTOMY Left 09/23/13   CHOLECYSTECTOMY       GASTRIC FUNDOPLICATION       left lumpectomy          Current Medications: Active Medications      Current Meds  Medication Sig   allopurinol (ZYLOPRIM) 300 MG tablet Take 300 mg by mouth daily.    apixaban (ELIQUIS) 5 MG TABS tablet TAKE 1 TABLET TWICE DAILY   atorvastatin (LIPITOR) 20 MG tablet Take  20 mg by mouth daily at 6 PM.    Calcium Carbonate-Vitamin D 600-400 MG-UNIT per tablet Take 1 tablet by mouth 3 (three) times a week.    gabapentin (NEURONTIN) 100 MG capsule Take two capsules (200 mg) in the am & three capsules (300 mg) in the pm   hydrochlorothiazide (MICROZIDE) 12.5 MG capsule Take 1 capsule (12.5 mg total) by mouth daily.   irbesartan (AVAPRO) 300 MG tablet Take 1 tablet (300 mg total) by mouth daily.   levothyroxine (SYNTHROID) 112 MCG tablet Take 88 mcg by mouth daily before breakfast.   methocarbamol (ROBAXIN) 500 MG tablet Take 250-500 mg by mouth 2 (two) times daily as needed.   metoprolol succinate (TOPROL XL) 25 MG 24 hr tablet Take 0.5 tablets (12.5 mg total) by mouth every other day.   oxybutynin (DITROPAN-XL) 5 MG 24 hr tablet Take 5 mg by mouth in the morning.        Allergies:   Enalapril and Tylenol [acetaminophen]    Social  History         Socioeconomic History   Marital status: Married      Spouse name: Not on file   Number of children: Not on file   Years of education: Not on file   Highest education level: Not on file  Occupational History   Not on file  Tobacco Use   Smoking status: Never   Smokeless tobacco: Never  Vaping Use   Vaping Use: Never used  Substance and Sexual Activity   Alcohol use: No   Drug use: No   Sexual activity: Not on file  Other Topics Concern   Not on file  Social History Narrative   Not on file    Social Determinants of Health    Financial Resource Strain: Not on file  Food Insecurity: Not on file  Transportation Needs: Not on file  Physical Activity: Not on file  Stress: Not on file  Social Connections: Not on file      Family History: The patient's family history includes Breast cancer in her cousin; Breast cancer (age of onset: 30) in her maternal aunt.   ROS:   Please see the history of present illness.    All other systems reviewed and are negative.   EKGs/Labs/Other Studies Reviewed:      The following studies were reviewed today:   June 04, 2021 ZIO monitor personally reviewed Paroxysmal atrial fibrillation with a 2% burden.  Longest pause was 3.6 seconds.  Patient triggered episodes corresponded to atrial fibrillation.   EKG:  The ekg ordered today demonstrates sinus rhythm.  PR interval 198 ms.  Ventricular rate 48 bpm.  QTc 412 ms.   Recent Labs: 07/29/2020: Hemoglobin 13.1; Platelets 258 08/03/2020: TSH 15.000 08/12/2020: BUN 18; Creatinine, Ser 1.15; Potassium 3.6; Sodium 139  Recent Lipid Panel Labs (Brief)  No results found for: CHOL, TRIG, HDL, CHOLHDL, VLDL, LDLCALC, LDLDIRECT     Physical Exam:     VS:  BP 130/78    Pulse (!) 48    Ht 5' (1.524 m)    Wt 151 lb (68.5 kg)    SpO2 98%    BMI 29.49 kg/m         Wt Readings from Last 3 Encounters:  06/09/21 151 lb (68.5 kg)  05/14/21 151 lb (68.5 kg)  03/17/21 154 lb (69.9 kg)      GEN:  Well nourished, well developed in no acute distress HEENT: Normal NECK: No JVD; No carotid bruits LYMPHATICS: No lymphadenopathy CARDIAC: RRR, no murmurs, rubs, gallops RESPIRATORY:  Clear to auscultation without rales, wheezing or rhonchi  ABDOMEN: Soft, non-tender, non-distended MUSCULOSKELETAL:  No edema; No deformity  SKIN: Warm and dry NEUROLOGIC:  Alert and oriented x 3 PSYCHIATRIC:  Normal affect            Assessment     ASSESSMENT:     1. Paroxysmal atrial fibrillation (HCC)   2. Primary hypertension   3. Sinus bradycardia   4. Pre-procedure lab exam     PLAN:     In order of problems listed above:   1. Paroxysmal atrial fibrillation (HCC) Highly symptomatic paroxysms of atrial fibrillation.  Most recent episode was captured on ZIO monitor.  On Eliquis for stroke prophylaxis.  Given the highly symptomatic nature of her paroxysms, she is interested in a more durable rhythm control strategy.  We discussed antiarrhythmic drug therapy during today's appointment including Tikosyn, amiodarone.  I  think given her resting bradycardia, she is unlikely to  tolerate antiarrhythmic drug therapy without a permanent pacemaker implanted.  We also discussed catheter ablation during today's appointment including the risk, recovery and likelihood of success.  After an extensive discussion about the pros and cons of each option she would like to proceed with a catheter ablation.   Risk, benefits, and alternatives to EP study and radiofrequency ablation for afib were also discussed in detail today. These risks include but are not limited to stroke, bleeding, vascular damage, tamponade, perforation, damage to the esophagus, lungs, and other structures, pulmonary vein stenosis, worsening renal function, and death. The patient understands these risk and wishes to proceed.  We will therefore proceed with catheter ablation at the next available time.  Carto, ICE, anesthesia are requested for the procedure.  Will also obtain CT PV protocol prior to the procedure to exclude LAA thrombus and further evaluate atrial anatomy.   She will also need an echocardiogram prior to the procedure.   2. Primary hypertension Controlled.  Continue current regimen.   3. Sinus bradycardia Limits our ability to use antiarrhythmic drug therapy or nodal blockers.  We discussed the possibility that she ages she would require permanent pacemaker to help manage her arrhythmia.        Signed, Lars Mage, MD, [facility], [facility] 06/09/2021 8:14 PM    Electrophysiology [place]     ----------------------------------------------  I have seen, examined the patient, and reviewed the above assessment and plan.    Plan for PVI today. Procedure reviewed.    Vickie Epley, MD 09/21/2021 6:58 AM

## 2021-09-21 NOTE — Transfer of Care (Signed)
Immediate Anesthesia Transfer of Care Note  Patient: Charlene Coleman  Procedure(s) Performed: ATRIAL FIBRILLATION ABLATION  Patient Location: Cath Lab  Anesthesia Type:General  Level of Consciousness: awake, alert  and oriented  Airway & Oxygen Therapy: Patient Spontanous Breathing and Patient connected to nasal cannula oxygen  Post-op Assessment: Report given to RN and Post -op Vital signs reviewed and stable  Post vital signs: Reviewed and stable  Last Vitals:  Vitals Value Taken Time  BP 134/55 09/21/21 1010  Temp 36.7 C 09/21/21 1010  Pulse 65 09/21/21 1011  Resp 13 09/21/21 1011  SpO2 100 % 09/21/21 1011  Vitals shown include unvalidated device data.  Last Pain:  Vitals:   09/21/21 1010  TempSrc: Temporal  PainSc: Asleep         Complications: No notable events documented.

## 2021-09-21 NOTE — Anesthesia Preprocedure Evaluation (Addendum)
Anesthesia Evaluation  Patient identified by MRN, date of birth, ID band Patient awake    Reviewed: Allergy & Precautions, NPO status , Patient's Chart, lab work & pertinent test results  Airway Mallampati: I  TM Distance: <3 FB Neck ROM: Full    Dental  (+) Teeth Intact, Dental Advisory Given   Pulmonary neg pulmonary ROS,    breath sounds clear to auscultation       Cardiovascular hypertension, Pt. on medications and Pt. on home beta blockers + CAD and +CHF  + dysrhythmias Atrial Fibrillation  Rhythm:Irregular Rate:Bradycardia  Echo: 1. Left ventricular ejection fraction, by estimation, is 60 to 65%. The  left ventricle has normal function. The left ventricle has no regional  wall motion abnormalities. There is mild asymmetric left ventricular  hypertrophy of the basal-septal segment.  Left ventricular diastolic parameters were normal.  2. Right ventricular systolic function is normal. The right ventricular  size is normal. There is normal pulmonary artery systolic pressure. The  estimated right ventricular systolic pressure is 76.1 mmHg.  3. LA index 26 ml/m2 - normal (however diameter 4.3 cm on parasternal  long).  4. The mitral valve is normal in structure. Mild mitral valve  regurgitation. No evidence of mitral stenosis.  5. The aortic valve is normal in structure. There is moderate  calcification of the aortic valve. There is mild thickening of the aortic  valve. Aortic valve regurgitation is not visualized. Aortic valve  sclerosis is present, with no evidence of aortic  valve stenosis.  6. The inferior vena cava is normal in size with greater than 50%  respiratory variability, suggesting right atrial pressure of 3 mmHg.    Neuro/Psych negative neurological ROS  negative psych ROS   GI/Hepatic Neg liver ROS, GERD  ,  Endo/Other  negative endocrine ROS  Renal/GU negative Renal ROS      Musculoskeletal  (+) Arthritis ,   Abdominal Normal abdominal exam  (+)   Peds  Hematology negative hematology ROS (+)   Anesthesia Other Findings   Reproductive/Obstetrics                         Anesthesia Physical Anesthesia Plan  ASA: 3  Anesthesia Plan: General   Post-op Pain Management:    Induction: Intravenous  PONV Risk Score and Plan: 3 and Ondansetron, Dexamethasone and Treatment may vary due to age or medical condition  Airway Management Planned: Oral ETT  Additional Equipment: None  Intra-op Plan:   Post-operative Plan: Extubation in OR  Informed Consent: I have reviewed the patients History and Physical, chart, labs and discussed the procedure including the risks, benefits and alternatives for the proposed anesthesia with the patient or authorized representative who has indicated his/her understanding and acceptance.     Dental advisory given  Plan Discussed with: CRNA  Anesthesia Plan Comments:        Anesthesia Quick Evaluation

## 2021-09-22 ENCOUNTER — Other Ambulatory Visit: Payer: Self-pay | Admitting: Student

## 2021-09-22 ENCOUNTER — Encounter (HOSPITAL_COMMUNITY): Payer: Self-pay | Admitting: Cardiology

## 2021-09-24 LAB — POCT ACTIVATED CLOTTING TIME
Activated Clotting Time: 281 seconds
Activated Clotting Time: 341 seconds

## 2021-09-27 ENCOUNTER — Other Ambulatory Visit: Payer: Self-pay | Admitting: *Deleted

## 2021-09-27 MED ORDER — HYDROCHLOROTHIAZIDE 12.5 MG PO CAPS: 12.5000 mg | ORAL_CAPSULE | Freq: Every day | ORAL | 0 refills | Status: DC

## 2021-10-11 DIAGNOSIS — H25011 Cortical age-related cataract, right eye: Secondary | ICD-10-CM | POA: Diagnosis not present

## 2021-10-11 DIAGNOSIS — H2511 Age-related nuclear cataract, right eye: Secondary | ICD-10-CM | POA: Diagnosis not present

## 2021-10-12 DIAGNOSIS — H2512 Age-related nuclear cataract, left eye: Secondary | ICD-10-CM | POA: Diagnosis not present

## 2021-10-15 ENCOUNTER — Encounter: Payer: Self-pay | Admitting: Cardiology

## 2021-10-15 ENCOUNTER — Ambulatory Visit: Payer: Medicare HMO | Admitting: Cardiology

## 2021-10-15 ENCOUNTER — Other Ambulatory Visit: Payer: Self-pay

## 2021-10-15 VITALS — BP 130/80 | HR 47 | Ht 59.0 in | Wt 152.0 lb

## 2021-10-15 DIAGNOSIS — I2510 Atherosclerotic heart disease of native coronary artery without angina pectoris: Secondary | ICD-10-CM

## 2021-10-15 DIAGNOSIS — I5189 Other ill-defined heart diseases: Secondary | ICD-10-CM

## 2021-10-15 DIAGNOSIS — I10 Essential (primary) hypertension: Secondary | ICD-10-CM

## 2021-10-15 DIAGNOSIS — I480 Paroxysmal atrial fibrillation: Secondary | ICD-10-CM | POA: Diagnosis not present

## 2021-10-15 MED ORDER — FUROSEMIDE 20 MG PO TABS: 20.0000 mg | ORAL_TABLET | ORAL | 0 refills | Status: DC | PRN

## 2021-10-15 NOTE — Progress Notes (Signed)
?Cardiology Office Note:   ? ?Date:  10/15/2021  ? ?ID:  Charlene Coleman, DOB May 29, 1941, MRN [id number] ? ?PCP:  Dion Body, MD  ?[facility] Cardiologist:  Kate Sable, MD  ?[facility] Electrophysiologist:  Vickie Epley, MD  ? ?Referring MD: Dion Body, MD  ? ?Chief Complaint  ?Patient presents with  ? Follow-up  ?  6 month F/U-Patient had recent cardiac ablation and has been doing much better.  ? ? ?History of Present Illness:   ? ?Charlene Coleman is a 81 y.o. female with a hx of paroxysmal atrial fibrillation s/p RFA 09/2018 20, hypertension, hyperlipidemia, non obstructive CAD (LHC 50% in D1, RCA 09/2007), HFpEF who presents for follow-up.   ? ?Patient being seen for HFpEF, paroxysmal symptomatic atrial fibrillation.  Was evaluated by EP, due to resting bradycardia, antiarrhythmics not deemed best strategy.  She successfully underwent radiofrequency ablation 09/21/2021.  She states feeling much better since her ablation, he is extremely pleased with the procedure and how she feels.  States not feeling this well in a very long time.  Tolerating Eliquis for stroke prophylaxis, no bleeding issues. ? ? ?Prior notes ?Upon chart review, patient had a Lexiscan Myoview performed on 09/30/2019 with mild lateral wall ischemia.   ?Cardiac monitor in the past revealed atrial fibrillation with 12.68% burden.  ?[place] 2009 showed moderate stenosis 50% in D1, RCA 09/2007 .   ?Echo 11/6254 normal systolic function, grade 2 diastolic dysfunction, moderate pulmonary hypertension. ? ?Past Medical History:  ?Diagnosis Date  ? (HFpEF) heart failure with preserved ejection fraction ([place])   ? A-fib ([place])   ? Borderline diabetes   ? Breast cancer ([place]) 2015  ? Left side - radiation - lumpectomy  ? CAD (coronary artery disease)   ? DCIS (ductal carcinoma in situ)   ? Empty sella syndrome ([place])   ? GERD (gastroesophageal reflux disease)   ? Gout   ? Hyperlipidemia   ? Hypertension   ? Personal history of radiation  therapy   ? Skin cancer 2011  ? ? ?Past Surgical History:  ?Procedure Laterality Date  ? ABDOMINAL HYSTERECTOMY    ? ATRIAL FIBRILLATION ABLATION N/A 09/21/2021  ? Procedure: ATRIAL FIBRILLATION ABLATION;  Surgeon: Vickie Epley, MD;  Location: [facility];  Service: Cardiovascular;  Laterality: N/A;  ? BREAST BIOPSY Left 2015  ? positive  ? BREAST LUMPECTOMY Left 09/23/2013  ? CATARACT EXTRACTION Bilateral   ? CHOLECYSTECTOMY    ? ELECTROPHYSIOLOGIC STUDY    ? GASTRIC FUNDOPLICATION    ? left lumpectomy    ? ? ?Current Medications: ?Current Meds  ?Medication Sig  ? allopurinol (ZYLOPRIM) 300 MG tablet Take 300 mg by mouth daily.   ? apixaban (ELIQUIS) 5 MG TABS tablet TAKE 1 TABLET TWICE DAILY  ? atorvastatin (LIPITOR) 20 MG tablet Take 20 mg by mouth every evening.  ? Calcium Carbonate-Vitamin D 600-400 MG-UNIT per tablet Take 1 tablet by mouth 3 (three) times a week.   ? carboxymethylcellulose (REFRESH PLUS) 0.5 % SOLN Place 1 drop into both eyes daily as needed (dry eyes).  ? colchicine 0.6 MG tablet Take 1 tablet (0.6 mg total) by mouth 2 (two) times daily for 5 days.  ? gabapentin (NEURONTIN) 100 MG capsule 200-300 mg See admin instructions. Take two capsules (200 mg) in the am & three capsules (300 mg) in the pm  ? hydrochlorothiazide (MICROZIDE) 12.5 MG capsule Take 1 capsule (12.5 mg total) by mouth daily.  ? irbesartan (  AVAPRO) 300 MG tablet Take 1 tablet (300 mg total) by mouth daily.  ? levothyroxine (SYNTHROID) 88 MCG tablet Take 88 mcg by mouth daily before breakfast.  ? methocarbamol (ROBAXIN) 500 MG tablet Take 250-500 mg by mouth 2 (two) times daily as needed for muscle spasms.  ? metoprolol succinate (TOPROL-XL) 25 MG 24 hr tablet Take 12.5 mg by mouth daily.  ? oxybutynin (DITROPAN-XL) 5 MG 24 hr tablet Take 5 mg by mouth in the morning.  ? pantoprazole (PROTONIX) 40 MG tablet Take 1 tablet (40 mg total) by mouth daily.  ? [DISCONTINUED] furosemide (LASIX) 20 MG tablet Take 1 tablet  (20 mg total) by mouth daily.  ?  ? ?Allergies:   Enalapril and Tylenol [acetaminophen]  ? ?Social History  ? ?Socioeconomic History  ? Marital status: Married  ?  Spouse name: Not on file  ? Number of children: Not on file  ? Years of education: Not on file  ? Highest education level: Not on file  ?Occupational History  ? Not on file  ?Tobacco Use  ? Smoking status: Never  ? Smokeless tobacco: Never  ?Vaping Use  ? Vaping Use: Never used  ?Substance and Sexual Activity  ? Alcohol use: No  ? Drug use: No  ? Sexual activity: Not on file  ?Other Topics Concern  ? Not on file  ?Social History Narrative  ? Not on file  ? ?Social Determinants of Health  ? ?Financial Resource Strain: Not on file  ?Food Insecurity: Not on file  ?Transportation Needs: Not on file  ?Physical Activity: Not on file  ?Stress: Not on file  ?Social Connections: Not on file  ?  ? ?Family History: ?The patient's family history includes Breast cancer in her cousin; Breast cancer (age of onset: 45) in her maternal aunt. ? ?ROS:   ?Please see the history of present illness.    ? All other systems reviewed and are negative. ? ?EKGs/Labs/Other Studies Reviewed:   ? ?The following studies were reviewed today: ? ? ?EKG:  EKG is  ordered today.  The ekg ordered today demonstrates sinus bradycardia. heart rate 47 ? ?Recent Labs: ?09/02/2021: BUN 18; Creatinine, Ser 1.05; Hemoglobin 11.5; Platelets 234; Potassium 3.8; Sodium 139  ?Recent Lipid Panel ?No results found for: CHOL, TRIG, HDL, CHOLHDL, VLDL, LDLCALC, LDLDIRECT ? ?Physical Exam:   ? ?VS:  BP 130/80 (BP Location: Left Arm, Patient Position: Sitting, Cuff Size: Normal)   Pulse (!) 47   Ht '4\' 11"'$  (1.499 m)   Wt 152 lb (68.9 kg)   SpO2 98%   BMI 30.70 kg/m?    ? ?Wt Readings from Last 3 Encounters:  ?10/15/21 152 lb (68.9 kg)  ?09/21/21 150 lb (68 kg)  ?06/09/21 151 lb (68.5 kg)  ?  ? ?GEN:  Well nourished, well developed in no acute distress ?HEENT: Normal ?NECK: No JVD; No carotid  bruits ?LYMPHATICS: No lymphadenopathy ?CARDIAC: Bradycardic, regular, no murmurs, rubs, gallops ?RESPIRATORY:  Clear to auscultation without rales, wheezing or rhonchi  ?ABDOMEN: Soft, non-tender, non-distended ?MUSCULOSKELETAL:  No edema; No deformity  ?SKIN: Warm and dry ?NEUROLOGIC:  Alert and oriented x 3 ?PSYCHIATRIC:  Normal affect  ? ?ASSESSMENT:   ? ?1. Coronary artery disease involving native coronary artery of native heart without angina pectoris   ?2. Diastolic dysfunction   ?3. Paroxysmal atrial fibrillation (HCC)   ?4. Primary hypertension   ? ?PLAN:   ? ?In order of problems listed above: ? ?CAD, moderate stenosis in the  first diagonal and RCA. denies chest pain.  Continue Eliquis, Lipitor.  Echo 03/2020 EF 55 to 60%.   ?Grade 2 diastolic dysfunction.  No edema, continue Lasix as needed. ?paroxysmal atrial fibrillation, s/p RFA 09/2021.  Currently in sinus brady, heart rate 47.  CHA2DS2-VASc of 4 (age, htn, gender).  Continue Eliquis, Toprol-XL 12.5 mg daily.  Follow-up with EP, consider stopping Toprol-XL due to bradycardia. ?hypertension, blood pressure controlled, continue irbesartan 300 mg daily, HCTZ 12.5 mg daily.   ? ?Follow-up in 6 months. ? ? ? ?Medication Adjustments/Labs and Tests Ordered: ?Current medicines are reviewed at length with the patient today.  Concerns regarding medicines are outlined above.  ?Orders Placed This Encounter  ?Procedures  ? EKG 12-Lead  ? ? ? ?Meds ordered this encounter  ?Medications  ? furosemide (LASIX) 20 MG tablet  ?  Sig: Take 1 tablet (20 mg total) by mouth as needed. For swelling.  ?  Dispense:  90 tablet  ?  Refill:  0  ?  No need to refill at this time, this is a dose adjustment to PRN.  ? ? ? ? ?Patient Instructions  ?Medication Instructions:  ? ?Your physician has recommended you make the following change in your medication:  ? ? Take your Furosemide (Lasix) only as needed for swelling. ? ? ?*If you need a refill on your cardiac medications before your  next appointment, please call your pharmacy* ? ? ?Lab Work: ?None ordered ?If you have labs (blood work) drawn today and your tests are completely normal, you will receive your results only by: ?MyChart Message (if you have My

## 2021-10-15 NOTE — Patient Instructions (Addendum)
Medication Instructions:  ? ?Your physician has recommended you make the following change in your medication:  ? ? Take your Furosemide (Lasix) only as needed for swelling. ? ? ?*If you need a refill on your cardiac medications before your next appointment, please call your pharmacy* ? ? ?Lab Work: ?None ordered ?If you have labs (blood work) drawn today and your tests are completely normal, you will receive your results only by: ?MyChart Message (if you have MyChart) OR ?A paper copy in the mail ?If you have any lab test that is abnormal or we need to change your treatment, we will call you to review the results. ? ? ?Testing/Procedures: ?None ordered ? ? ?Follow-Up: ?At [facility], you and your health needs are our priority.  As part of our continuing mission to provide you with exceptional heart care, we have created designated Provider Care Teams.  These Care Teams include your primary Cardiologist (physician) and Advanced Practice Providers (APPs -  Physician Assistants and Nurse Practitioners) who all work together to provide you with the care you need, when you need it. ? ?We recommend signing up for the patient portal called "MyChart".  Sign up information is provided on this After Visit Summary.  MyChart is used to connect with patients for Virtual Visits (Telemedicine).  Patients are able to view lab/test results, encounter notes, upcoming appointments, etc.  Non-urgent messages can be sent to your provider as well.   ?To learn more about what you can do with MyChart, go to [email or web address].   ? ?Your next appointment:   ?6 month(s) ? ?The format for your next appointment:   ?In Person ? ?Provider:   ?You may see Kate Sable, MD or one of the following Advanced Practice Providers on your designated Care Team:   ?Murray Hodgkins, NP ?Christell Faith, PA-C ?Cadence Kathlen Mody, PA-C  ? ? ?Other Instructions ? ? ?

## 2021-10-19 ENCOUNTER — Other Ambulatory Visit: Payer: Self-pay

## 2021-10-19 ENCOUNTER — Encounter (HOSPITAL_COMMUNITY): Payer: Self-pay | Admitting: Physician Assistant

## 2021-10-19 ENCOUNTER — Ambulatory Visit (HOSPITAL_COMMUNITY)
Admission: RE | Admit: 2021-10-19 | Discharge: 2021-10-19 | Disposition: A | Discharge status: Home / Self Care | Payer: Medicare HMO | Source: Ambulatory Visit | Attending: Physician Assistant | Admitting: Physician Assistant

## 2021-10-19 VITALS — BP 134/74 | HR 51 | Ht 59.0 in | Wt 154.4 lb

## 2021-10-19 DIAGNOSIS — I480 Paroxysmal atrial fibrillation: Secondary | ICD-10-CM | POA: Insufficient documentation

## 2021-10-19 DIAGNOSIS — D6869 Other thrombophilia: Secondary | ICD-10-CM | POA: Diagnosis not present

## 2021-10-19 DIAGNOSIS — Z6831 Body mass index (BMI) 31.0-31.9, adult: Secondary | ICD-10-CM | POA: Insufficient documentation

## 2021-10-19 DIAGNOSIS — E785 Hyperlipidemia, unspecified: Secondary | ICD-10-CM | POA: Diagnosis not present

## 2021-10-19 DIAGNOSIS — I5032 Chronic diastolic (congestive) heart failure: Secondary | ICD-10-CM | POA: Insufficient documentation

## 2021-10-19 DIAGNOSIS — I2510 Atherosclerotic heart disease of native coronary artery without angina pectoris: Secondary | ICD-10-CM | POA: Insufficient documentation

## 2021-10-19 DIAGNOSIS — I110 Hypertensive heart disease with heart failure: Secondary | ICD-10-CM | POA: Diagnosis not present

## 2021-10-19 DIAGNOSIS — E669 Obesity, unspecified: Secondary | ICD-10-CM | POA: Diagnosis not present

## 2021-10-19 DIAGNOSIS — Z7901 Long term (current) use of anticoagulants: Secondary | ICD-10-CM | POA: Diagnosis not present

## 2021-10-19 NOTE — Progress Notes (Signed)
? ? ?Primary Care Physician: Dion Body, MD ?Primary Cardiologist: Dr Garen Lah ?Primary Electrophysiologist: Dr Quentin Ore ?Referring Physician: Dr Quentin Ore ? ? ?Charlene Coleman is a 81 y.o. female with a history of HTN, HLD, CAD, HFpEF, atrial fibrillation who presents for follow up in the [facility]. Patient is on Eliquis for a CHADS2VASC score of 6. She had been maintained on amiodarone but this was discontinued due to thyroid dysfunction. She underwent afib ablation with Dr Quentin Ore on 09/21/21. Patient reports that she feels improved since the procedure. Her heart seems "quieter", especially at night. She has had two episodes of heart racing which lasted about one hour. She denies CP, swallowing pain, or groin issues.  ? ?Today, she denies symptoms of chest pain, shortness of breath, orthopnea, PND, lower extremity edema, dizziness, presyncope, syncope, snoring, daytime somnolence, bleeding, or neurologic sequela. The patient is tolerating medications without difficulties and is otherwise without complaint today.  ? ? ?Atrial Fibrillation Risk Factors: ? ?she does not have symptoms or diagnosis of sleep apnea. ?she does not have a history of rheumatic fever. ? ? ?she has a BMI of Body mass index is 31.19 kg/m?Marland KitchenMarland Kitchen ?Filed Weights  ? 10/19/21 1331  ?Weight: 70 kg  ? ? ?Family History  ?Problem Relation Age of Onset  ? Breast cancer Cousin   ? Breast cancer Maternal Aunt 74  ? ? ? ?Atrial Fibrillation Management history: ? ?Previous antiarrhythmic drugs: amiodarone  ?Previous cardioversions: none ?Previous ablations: 09/21/21 ?CHADS2VASC score: 6 ?Anticoagulation history: Eliquis  ? ? ?Past Medical History:  ?Diagnosis Date  ? (HFpEF) heart failure with preserved ejection fraction ([place])   ? A-fib ([place])   ? Borderline diabetes   ? Breast cancer ([place]) 2015  ? Left side - radiation - lumpectomy  ? CAD (coronary artery disease)   ? DCIS (ductal carcinoma in situ)   ? Empty sella syndrome  ([place])   ? GERD (gastroesophageal reflux disease)   ? Gout   ? Hyperlipidemia   ? Hypertension   ? Personal history of radiation therapy   ? Skin cancer 2011  ? ?Past Surgical History:  ?Procedure Laterality Date  ? ABDOMINAL HYSTERECTOMY    ? ATRIAL FIBRILLATION ABLATION N/A 09/21/2021  ? Procedure: ATRIAL FIBRILLATION ABLATION;  Surgeon: Vickie Epley, MD;  Location: [facility];  Service: Cardiovascular;  Laterality: N/A;  ? BREAST BIOPSY Left 2015  ? positive  ? BREAST LUMPECTOMY Left 09/23/2013  ? CATARACT EXTRACTION Bilateral   ? CHOLECYSTECTOMY    ? ELECTROPHYSIOLOGIC STUDY    ? GASTRIC FUNDOPLICATION    ? left lumpectomy    ? ? ?Current Outpatient Medications  ?Medication Sig Dispense Refill  ? allopurinol (ZYLOPRIM) 300 MG tablet Take 300 mg by mouth daily.     ? apixaban (ELIQUIS) 5 MG TABS tablet TAKE 1 TABLET TWICE DAILY 60 tablet 6  ? atorvastatin (LIPITOR) 20 MG tablet Take 20 mg by mouth every evening.    ? Calcium Carbonate-Vitamin D 600-400 MG-UNIT per tablet Take 1 tablet by mouth 3 (three) times a week.     ? carboxymethylcellulose (REFRESH PLUS) 0.5 % SOLN Place 1 drop into both eyes daily as needed (dry eyes).    ? colchicine 0.6 MG tablet Take 1 tablet (0.6 mg total) by mouth 2 (two) times daily for 5 days. 10 tablet 0  ? furosemide (LASIX) 20 MG tablet Take 1 tablet (20 mg total) by mouth as needed. For swelling. 90 tablet 0  ?  gabapentin (NEURONTIN) 100 MG capsule 200-300 mg See admin instructions. Take two capsules (200 mg) in the am & three capsules (300 mg) in the pm    ? hydrochlorothiazide (MICROZIDE) 12.5 MG capsule Take 1 capsule (12.5 mg total) by mouth daily. 90 capsule 0  ? irbesartan (AVAPRO) 300 MG tablet Take 1 tablet (300 mg total) by mouth daily. 90 tablet 3  ? levothyroxine (SYNTHROID) 88 MCG tablet Take 88 mcg by mouth daily before breakfast.    ? methocarbamol (ROBAXIN) 500 MG tablet Take 250-500 mg by mouth 2 (two) times daily as needed for muscle spasms.    ?  metoprolol succinate (TOPROL-XL) 25 MG 24 hr tablet Take 12.5 mg by mouth daily.    ? oxybutynin (DITROPAN-XL) 5 MG 24 hr tablet Take 5 mg by mouth in the morning.    ? pantoprazole (PROTONIX) 40 MG tablet Take 1 tablet (40 mg total) by mouth daily. 45 tablet 0  ? ?No current facility-administered medications for this encounter.  ? ? ?Allergies  ?Allergen Reactions  ? Enalapril Cough  ? Tylenol [Acetaminophen] Tinitus  ? ? ?Social History  ? ?Socioeconomic History  ? Marital status: Married  ?  Spouse name: Not on file  ? Number of children: Not on file  ? Years of education: Not on file  ? Highest education level: Not on file  ?Occupational History  ? Not on file  ?Tobacco Use  ? Smoking status: Never  ? Smokeless tobacco: Never  ? Tobacco comments:  ?  Never smoke 10/19/21  ?Vaping Use  ? Vaping Use: Never used  ?Substance and Sexual Activity  ? Alcohol use: No  ? Drug use: No  ? Sexual activity: Not on file  ?Other Topics Concern  ? Not on file  ?Social History Narrative  ? Not on file  ? ?Social Determinants of Health  ? ?Financial Resource Strain: Not on file  ?Food Insecurity: Not on file  ?Transportation Needs: Not on file  ?Physical Activity: Not on file  ?Stress: Not on file  ?Social Connections: Not on file  ?Intimate Partner Violence: Not on file  ? ? ? ?ROS- All systems are reviewed and negative except as per the HPI above. ? ?Physical Exam: ?Vitals:  ? 10/19/21 1331  ?BP: 134/74  ?Pulse: (!) 51  ?Weight: 70 kg  ?Height: '4\' 11"'$  (1.499 m)  ? ? ?GEN- The patient is a well appearing elderly obese female, alert and oriented x 3 today.   ?Head- normocephalic, atraumatic ?Eyes-  Sclera clear, conjunctiva pink ?Ears- hearing intact ?Oropharynx- clear ?Neck- supple  ?Lungs- Clear to ausculation bilaterally, normal work of breathing ?Heart- Regular rate and rhythm, bradycardia, no murmurs, rubs or gallops  ?GI- soft, NT, ND, + BS ?Extremities- no clubbing, cyanosis, or edema ?MS- no significant deformity or  atrophy ?Skin- no rash or lesion ?Psych- euthymic mood, full affect ?Neuro- strength and sensation are intact ? ?Wt Readings from Last 3 Encounters:  ?10/19/21 70 kg  ?10/15/21 68.9 kg  ?09/21/21 68 kg  ? ? ?EKG today demonstrates  ?SB ?Vent. rate 51 BPM ?PR interval 192 ms ?QRS duration 80 ms ?QT/QTcB 448/412 ms ? ?Echo 06/29/21 demonstrated  ? 1. Left ventricular ejection fraction, by estimation, is 60 to 65%. The  ?left ventricle has normal function. The left ventricle has no regional  ?wall motion abnormalities. There is mild asymmetric left ventricular  ?hypertrophy of the basal-septal segment. Left ventricular diastolic parameters were normal.  ? 2. Right ventricular systolic  function is normal. The right ventricular  ?size is normal. There is normal pulmonary artery systolic pressure. The  ?estimated right ventricular systolic pressure is 62.9 mmHg.  ? 3. LA index 26 ml/m2 - normal (however diameter 4.3 cm on parasternal long).  ? 4. The mitral valve is normal in structure. Mild mitral valve  ?regurgitation. No evidence of mitral stenosis.  ? 5. The aortic valve is normal in structure. There is moderate  ?calcification of the aortic valve. There is mild thickening of the aortic  ?valve. Aortic valve regurgitation is not visualized. Aortic valve  ?sclerosis is present, with no evidence of aortic  ?valve stenosis.  ? 6. The inferior vena cava is normal in size with greater than 50%  ?respiratory variability, suggesting right atrial pressure of 3 mmHg.  ? ?Epic records are reviewed at length today ? ?CHA2DS2-VASc Score = 6  ?The patient's score is based upon: ?CHF History: 1 ?HTN History: 1 ?Diabetes History: 0 ?Stroke History: 0 ?Vascular Disease History: 1 ?Age Score: 2 ?Gender Score: 1 ?    ? ? ?ASSESSMENT AND PLAN: ?1. Paroxysmal Atrial Fibrillation (ICD10:  I48.0) ?The patient's CHA2DS2-VASc score is 6, indicating a 9.7% annual risk of stroke.   ?S/p afib ablation 09/21/21 ?Patient having infrequent  tachypalpitations, she is happy with her treatment.  ?Continue Eliquis 5 mg BID with no missed doses for 3 months post ablation.  ?Continue Toprol 12.5 mg daily ? ?2. Secondary Hypercoagulable State (ICD10:  D68.69)

## 2021-10-20 ENCOUNTER — Other Ambulatory Visit: Payer: Self-pay

## 2021-10-20 DIAGNOSIS — D2262 Melanocytic nevi of left upper limb, including shoulder: Secondary | ICD-10-CM | POA: Diagnosis not present

## 2021-10-20 DIAGNOSIS — D2261 Melanocytic nevi of right upper limb, including shoulder: Secondary | ICD-10-CM | POA: Diagnosis not present

## 2021-10-20 DIAGNOSIS — C44622 Squamous cell carcinoma of skin of right upper limb, including shoulder: Secondary | ICD-10-CM | POA: Diagnosis not present

## 2021-10-20 DIAGNOSIS — D485 Neoplasm of uncertain behavior of skin: Secondary | ICD-10-CM | POA: Diagnosis not present

## 2021-10-20 DIAGNOSIS — D2271 Melanocytic nevi of right lower limb, including hip: Secondary | ICD-10-CM | POA: Diagnosis not present

## 2021-10-20 DIAGNOSIS — C44729 Squamous cell carcinoma of skin of left lower limb, including hip: Secondary | ICD-10-CM | POA: Diagnosis not present

## 2021-10-20 DIAGNOSIS — Z85828 Personal history of other malignant neoplasm of skin: Secondary | ICD-10-CM | POA: Diagnosis not present

## 2021-10-20 DIAGNOSIS — D2272 Melanocytic nevi of left lower limb, including hip: Secondary | ICD-10-CM | POA: Diagnosis not present

## 2021-10-20 MED ORDER — FUROSEMIDE 20 MG PO TABS: 20.0000 mg | ORAL_TABLET | Freq: Every day | ORAL | 0 refills | Status: DC | PRN

## 2021-10-25 DIAGNOSIS — H2512 Age-related nuclear cataract, left eye: Secondary | ICD-10-CM | POA: Diagnosis not present

## 2021-10-25 DIAGNOSIS — H25012 Cortical age-related cataract, left eye: Secondary | ICD-10-CM | POA: Diagnosis not present

## 2021-10-28 DIAGNOSIS — E236 Other disorders of pituitary gland: Secondary | ICD-10-CM | POA: Diagnosis not present

## 2021-10-28 DIAGNOSIS — E039 Hypothyroidism, unspecified: Secondary | ICD-10-CM | POA: Diagnosis not present

## 2021-11-12 ENCOUNTER — Other Ambulatory Visit: Payer: Self-pay | Admitting: Cardiology

## 2021-11-12 NOTE — Telephone Encounter (Signed)
Prescription refill request for Eliquis received. ?Indication: Afib  ?Last office visit: 10/19/21 Charlene Coleman)  ?Scr: 1.05 (09/02/21) ?Age: 81  ?Weight: 70kg ? ?Appropriate dose and refill sent to requested pharmacy.  ?

## 2021-11-25 DIAGNOSIS — C44729 Squamous cell carcinoma of skin of left lower limb, including hip: Secondary | ICD-10-CM | POA: Diagnosis not present

## 2021-11-25 DIAGNOSIS — D0439 Carcinoma in situ of skin of other parts of face: Secondary | ICD-10-CM | POA: Diagnosis not present

## 2021-11-25 DIAGNOSIS — D485 Neoplasm of uncertain behavior of skin: Secondary | ICD-10-CM | POA: Diagnosis not present

## 2021-11-25 DIAGNOSIS — L905 Scar conditions and fibrosis of skin: Secondary | ICD-10-CM | POA: Diagnosis not present

## 2021-11-25 DIAGNOSIS — C44622 Squamous cell carcinoma of skin of right upper limb, including shoulder: Secondary | ICD-10-CM | POA: Diagnosis not present

## 2021-12-06 DIAGNOSIS — E782 Mixed hyperlipidemia: Secondary | ICD-10-CM | POA: Diagnosis not present

## 2021-12-10 ENCOUNTER — Other Ambulatory Visit: Payer: Self-pay | Admitting: *Deleted

## 2021-12-10 MED ORDER — HYDROCHLOROTHIAZIDE 12.5 MG PO CAPS: 12.5000 mg | ORAL_CAPSULE | Freq: Every day | ORAL | 0 refills | Status: DC

## 2021-12-13 DIAGNOSIS — I10 Essential (primary) hypertension: Secondary | ICD-10-CM | POA: Diagnosis not present

## 2021-12-13 DIAGNOSIS — I480 Paroxysmal atrial fibrillation: Secondary | ICD-10-CM | POA: Diagnosis not present

## 2021-12-13 DIAGNOSIS — M898X1 Other specified disorders of bone, shoulder: Secondary | ICD-10-CM | POA: Diagnosis not present

## 2021-12-13 DIAGNOSIS — N289 Disorder of kidney and ureter, unspecified: Secondary | ICD-10-CM | POA: Diagnosis not present

## 2021-12-13 DIAGNOSIS — Z8739 Personal history of other diseases of the musculoskeletal system and connective tissue: Secondary | ICD-10-CM | POA: Diagnosis not present

## 2021-12-13 DIAGNOSIS — E782 Mixed hyperlipidemia: Secondary | ICD-10-CM | POA: Diagnosis not present

## 2021-12-22 ENCOUNTER — Ambulatory Visit: Payer: Medicare HMO | Admitting: Cardiology

## 2021-12-22 ENCOUNTER — Encounter: Payer: Self-pay | Admitting: Cardiology

## 2021-12-22 ENCOUNTER — Encounter: Payer: Self-pay | Admitting: *Deleted

## 2021-12-22 VITALS — BP 128/68 | HR 48 | Ht 60.0 in | Wt 151.0 lb

## 2021-12-22 DIAGNOSIS — I480 Paroxysmal atrial fibrillation: Secondary | ICD-10-CM

## 2021-12-22 DIAGNOSIS — I209 Angina pectoris, unspecified: Secondary | ICD-10-CM | POA: Diagnosis not present

## 2021-12-22 MED ORDER — ASPIRIN 81 MG PO TBEC: 81.0000 mg | DELAYED_RELEASE_TABLET | Freq: Every day | ORAL | 3 refills | Status: DC

## 2021-12-22 NOTE — Progress Notes (Signed)
Electrophysiology Office Follow up Visit Note:    Date:  12/22/2021   ID:  Charlene Coleman, DOB [birth year], MRN [id number]  PCP:  Dion Body, MD  [facility] Cardiologist:  Kate Sable, MD  [facility] Electrophysiologist:  Vickie Epley, MD    Interval History:    Charlene Coleman is a 81 y.o. female who presents for a follow up visit after her A-fib ablation on September 21, 2021.  During her ablation the pulmonary veins were isolated.  Since the ablation, she is continue to have salvos of symptomatic palpitations/arrhythmia.  Over time her episodes have also been accompanied by pressure-like chest discomfort that radiates to her back.  This is relatively new for her.  She continues to take Eliquis for stroke prophylaxis.       Past Medical History:  Diagnosis Date   (HFpEF) heart failure with preserved ejection fraction (HCC)    A-fib (HCC)    Borderline diabetes    Breast cancer ([place]) 2015   Left side - radiation - lumpectomy   CAD (coronary artery disease)    DCIS (ductal carcinoma in situ)    Empty sella syndrome (HCC)    GERD (gastroesophageal reflux disease)    Gout    Hyperlipidemia    Hypertension    Personal history of radiation therapy    Skin cancer 2011    Past Surgical History:  Procedure Laterality Date   ABDOMINAL HYSTERECTOMY     ATRIAL FIBRILLATION ABLATION N/A 09/21/2021   Procedure: ATRIAL FIBRILLATION ABLATION;  Surgeon: Vickie Epley, MD;  Location: [place] [facility];  Service: Cardiovascular;  Laterality: N/A;   BREAST BIOPSY Left 2015   positive   BREAST LUMPECTOMY Left 09/23/2013   CATARACT EXTRACTION Bilateral    CHOLECYSTECTOMY     ELECTROPHYSIOLOGIC STUDY     GASTRIC FUNDOPLICATION     left lumpectomy      Current Medications: Current Meds  Medication Sig   allopurinol (ZYLOPRIM) 300 MG tablet Take 300 mg by mouth daily.    apixaban (ELIQUIS) 5 MG TABS tablet TAKE 1 TABLET TWICE DAILY   atorvastatin (LIPITOR)  20 MG tablet Take 20 mg by mouth every evening.   Calcium Carbonate-Vitamin D 600-400 MG-UNIT per tablet Take 1 tablet by mouth 3 (three) times a week.    carboxymethylcellulose (REFRESH PLUS) 0.5 % SOLN Place 1 drop into both eyes daily as needed (dry eyes).   furosemide (LASIX) 20 MG tablet Take 1 tablet (20 mg total) by mouth daily as needed. For swelling.   gabapentin (NEURONTIN) 100 MG capsule 300 mg at bedtime.   irbesartan (AVAPRO) 300 MG tablet Take 1 tablet (300 mg total) by mouth daily.   levothyroxine (SYNTHROID) 88 MCG tablet Take 88 mcg by mouth daily before breakfast.   methocarbamol (ROBAXIN) 500 MG tablet Take 250-500 mg by mouth 2 (two) times daily as needed for muscle spasms.   metoprolol succinate (TOPROL-XL) 25 MG 24 hr tablet Take 12.5 mg by mouth daily.   oxybutynin (DITROPAN-XL) 5 MG 24 hr tablet Take 5 mg by mouth in the morning.   pantoprazole (PROTONIX) 40 MG tablet Take 1 tablet (40 mg total) by mouth daily.     Allergies:   Enalapril and Tylenol [acetaminophen]   Social History   Socioeconomic History   Marital status: Married    Spouse name: Not on file   Number of children: Not on file   Years of education: Not on file   Highest education  level: Not on file  Occupational History   Not on file  Tobacco Use   Smoking status: Never   Smokeless tobacco: Never   Tobacco comments:    Never smoke 10/19/21  Vaping Use   Vaping Use: Never used  Substance and Sexual Activity   Alcohol use: No   Drug use: No   Sexual activity: Not on file  Other Topics Concern   Not on file  Social History Narrative   Not on file   Social Determinants of Health   Financial Resource Strain: Not on file  Food Insecurity: Not on file  Transportation Needs: Not on file  Physical Activity: Not on file  Stress: Not on file  Social Connections: Not on file     Family History: The patient's family history includes Breast cancer in her cousin; Breast cancer (age of onset:  39) in her maternal aunt.  ROS:   Please see the history of present illness.    All other systems reviewed and are negative.  EKGs/Labs/Other Studies Reviewed:    The following studies were reviewed today:  September 14, 2021 CT coronary Coronary artery calcium score of 1692 which is the 96th percentile.  EKG:  The ekg ordered today demonstrates sinus rhythm.  QTc is 400 ms.  Ventricular rate 48 bpm.  Recent Labs: 09/02/2021: BUN 18; Creatinine, Ser 1.05; Hemoglobin 11.5; Platelets 234; Potassium 3.8; Sodium 139  Recent Lipid Panel No results found for: CHOL, TRIG, HDL, CHOLHDL, VLDL, LDLCALC, LDLDIRECT  Physical Exam:    VS:  BP 128/68   Pulse (!) 48   Ht 5' (1.524 m)   Wt 151 lb (68.5 kg)   BMI 29.49 kg/m     Wt Readings from Last 3 Encounters:  12/22/21 151 lb (68.5 kg)  10/19/21 154 lb 6.4 oz (70 kg)  10/15/21 152 lb (68.9 kg)     GEN:  Well nourished, well developed in no acute distress HEENT: Normal NECK: No JVD; No carotid bruits LYMPHATICS: No lymphadenopathy CARDIAC: RRR, no murmurs, rubs, gallops RESPIRATORY:  Clear to auscultation without rales, wheezing or rhonchi  ABDOMEN: Soft, non-tender, non-distended MUSCULOSKELETAL:  No edema; No deformity  SKIN: Warm and dry NEUROLOGIC:  Alert and oriented x 3 PSYCHIATRIC:  Normal affect        ASSESSMENT:    1. Paroxysmal atrial fibrillation (HCC)   2. Typical angina (HCC)    PLAN:    In order of problems listed above:  #Paroxysmal atrial fibrillation Symptomatic salvos of atrial fibrillation continue despite ablation.  Amiodarone is not an option given prior thyroid toxicity.  She has been off of amiodarone for months now and her QTc is 400 ms.  I would like to start her on Tikosyn.  I discussed Tikosyn in detail with the patient including the need for inpatient monitoring upon initiation and she is willing to proceed.  We will get this scheduled for her.  In the meantime she will continue Eliquis.  She  would likely need to have her metoprolol stopped as we initiate Tikosyn given her resting heart rate in the 40s.  #Angina Typical symptoms when she is having rapid heart rates while in atrial fibrillation.  She has a very elevated coronary artery calcium score on recent CT scan.  She has had prior stress test in the [facility] system which have not shown ischemia.  I would like to proceed directly to left heart catheterization.  I discussed the heart catheterization procedure in detail with the patient  including the risks and recovery and she wishes to proceed.  I will start her on an aspirin 81 mg by mouth once daily.  She will continue her Lipitor 20 mg by mouth daily.   Follow-up based on above.  Medication Adjustments/Labs and Tests Ordered: Current medicines are reviewed at length with the patient today.  Concerns regarding medicines are outlined above.  No orders of the defined types were placed in this encounter.  No orders of the defined types were placed in this encounter.    Signed, Lars Mage, MD, [facility], [facility] 12/22/2021 4:28 PM    Electrophysiology [facility]

## 2021-12-22 NOTE — Patient Instructions (Addendum)
Medications: Start Aspirin 81 mg daily.  Your physician recommends that you continue on your current medications as directed. Please refer to the Current Medication list given to you today. *If you need a refill on your cardiac medications before your next appointment, please call your pharmacy*  Lab Work: None. If you have labs (blood work) drawn today and your tests are completely normal, you will receive your results only by: Charlene Coleman (if you have MyChart) OR A paper copy in the mail If you have any lab test that is abnormal or we need to change your treatment, we will call you to review the results.  Testing/Procedures: Your physician has requested that you have a cardiac catheterization. Cardiac catheterization is used to diagnose and/or treat various heart conditions. Doctors may recommend this procedure for a number of different reasons. The most common reason is to evaluate chest pain. Chest pain can be a symptom of coronary artery disease (CAD), and cardiac catheterization can show whether plaque is narrowing or blocking your heart's arteries. This procedure is also used to evaluate the valves, as well as measure the blood flow and oxygen levels in different parts of your heart. For further information please visit [email or web address]. Please follow instruction sheet, as given.   Follow-Up: At [facility], you and your health needs are our priority.  As part of our continuing mission to provide you with exceptional heart care, we have created designated Provider Care Teams.  These Care Teams include your primary Cardiologist (physician) and Advanced Practice Providers (APPs -  Physician Assistants and Nurse Practitioners) who all work together to provide you with the care you need, when you need it.  Your physician wants you to follow-up in: the Afib Clinic will contact you for the Tikosyn follow up.   We recommend signing up for the patient portal called "MyChart".  Sign up  information is provided on this After Visit Summary.  MyChart is used to connect with patients for Virtual Visits (Telemedicine).  Patients are able to view lab/test results, encounter notes, upcoming appointments, etc.  Non-urgent messages can be sent to your provider as well.   To learn more about what you can do with MyChart, go to [email or web address].    Any Other Special Instructions Will Be Listed Below (If Applicable).  Tikosyn (Dofetilide) Hospital Admission   Prior to day of admission:  Check with drug insurance company for cost of drug to ensure affordability --- Dofetilide 500 mcg twice a day.  GoodRx is an option if insurance copay is unaffordable.   All patients are tested for COVID-19 prior to admission.   No Benadryl is allowed 3 days prior to admission.   Please ensure no missed doses of your anticoagulation (blood thinner) for 3 weeks prior to admission. If a dose is missed please notify our office immediately.   A pharmacist will review all your medications for potential interactions with Tikosyn. If any medication changes are needed prior to admission we will be in touch with you.   If any new medications are started AFTER your admission date is set with Nurse, adult. Please notify our office immediately so your medication list can be updated and reviewed by our pharmacist again.  On day of admission:  Tikosyn initiation requires a 3 night/4 day hospital stay with constant telemetry monitoring. You will have an EKG after each dose of Tikosyn as well as daily lab draws.   If the drug does not convert you to normal rhythm a cardioversion after  the 4th dose of Tikosyn.   Afib Clinic office visit on the morning of admission is needed for preliminary labs/ekg.   Time of admission is dependent on bed availability in the hospital. In some instances, you will be sent home until bed is available. Rarely admission can be delayed to the following day if hospital census prevents  available beds.   You may bring personal belongings/clothing with you to the hospital. Please leave your suitcase in the car until you arrive in admissions.   Questions please call our office at [phone]

## 2021-12-22 NOTE — H&P (View-Only) (Signed)
Electrophysiology Office Follow up Visit Note:    Date:  12/22/2021   ID:  Charlene Coleman, DOB Dec 18, 1940, MRN [id number]  PCP:  Dion Body, MD  [facility] Cardiologist:  Kate Sable, MD  [facility] Electrophysiologist:  Vickie Epley, MD    Interval History:    Charlene Coleman is a 81 y.o. female who presents for a follow up visit after her A-fib ablation on September 21, 2021.  During her ablation the pulmonary veins were isolated.  Since the ablation, she is continue to have salvos of symptomatic palpitations/arrhythmia.  Over time her episodes have also been accompanied by pressure-like chest discomfort that radiates to her back.  This is relatively new for her.  She continues to take Eliquis for stroke prophylaxis.       Past Medical History:  Diagnosis Date   (HFpEF) heart failure with preserved ejection fraction (HCC)    A-fib (HCC)    Borderline diabetes    Breast cancer ([place]) 2015   Left side - radiation - lumpectomy   CAD (coronary artery disease)    DCIS (ductal carcinoma in situ)    Empty sella syndrome (HCC)    GERD (gastroesophageal reflux disease)    Gout    Hyperlipidemia    Hypertension    Personal history of radiation therapy    Skin cancer 2011    Past Surgical History:  Procedure Laterality Date   ABDOMINAL HYSTERECTOMY     ATRIAL FIBRILLATION ABLATION N/A 09/21/2021   Procedure: ATRIAL FIBRILLATION ABLATION;  Surgeon: Vickie Epley, MD;  Location: [place] [facility];  Service: Cardiovascular;  Laterality: N/A;   BREAST BIOPSY Left 2015   positive   BREAST LUMPECTOMY Left 09/23/2013   CATARACT EXTRACTION Bilateral    CHOLECYSTECTOMY     ELECTROPHYSIOLOGIC STUDY     GASTRIC FUNDOPLICATION     left lumpectomy      Current Medications: Current Meds  Medication Sig   allopurinol (ZYLOPRIM) 300 MG tablet Take 300 mg by mouth daily.    apixaban (ELIQUIS) 5 MG TABS tablet TAKE 1 TABLET TWICE DAILY   atorvastatin (LIPITOR)  20 MG tablet Take 20 mg by mouth every evening.   Calcium Carbonate-Vitamin D 600-400 MG-UNIT per tablet Take 1 tablet by mouth 3 (three) times a week.    carboxymethylcellulose (REFRESH PLUS) 0.5 % SOLN Place 1 drop into both eyes daily as needed (dry eyes).   furosemide (LASIX) 20 MG tablet Take 1 tablet (20 mg total) by mouth daily as needed. For swelling.   gabapentin (NEURONTIN) 100 MG capsule 300 mg at bedtime.   irbesartan (AVAPRO) 300 MG tablet Take 1 tablet (300 mg total) by mouth daily.   levothyroxine (SYNTHROID) 88 MCG tablet Take 88 mcg by mouth daily before breakfast.   methocarbamol (ROBAXIN) 500 MG tablet Take 250-500 mg by mouth 2 (two) times daily as needed for muscle spasms.   metoprolol succinate (TOPROL-XL) 25 MG 24 hr tablet Take 12.5 mg by mouth daily.   oxybutynin (DITROPAN-XL) 5 MG 24 hr tablet Take 5 mg by mouth in the morning.   pantoprazole (PROTONIX) 40 MG tablet Take 1 tablet (40 mg total) by mouth daily.     Allergies:   Enalapril and Tylenol [acetaminophen]   Social History   Socioeconomic History   Marital status: Married    Spouse name: Not on file   Number of children: Not on file   Years of education: Not on file   Highest education  level: Not on file  Occupational History   Not on file  Tobacco Use   Smoking status: Never   Smokeless tobacco: Never   Tobacco comments:    Never smoke 10/19/21  Vaping Use   Vaping Use: Never used  Substance and Sexual Activity   Alcohol use: No   Drug use: No   Sexual activity: Not on file  Other Topics Concern   Not on file  Social History Narrative   Not on file   Social Determinants of Health   Financial Resource Strain: Not on file  Food Insecurity: Not on file  Transportation Needs: Not on file  Physical Activity: Not on file  Stress: Not on file  Social Connections: Not on file     Family History: The patient's family history includes Breast cancer in her cousin; Breast cancer (age of onset:  65) in her maternal aunt.  ROS:   Please see the history of present illness.    All other systems reviewed and are negative.  EKGs/Labs/Other Studies Reviewed:    The following studies were reviewed today:  September 14, 2021 CT coronary Coronary artery calcium score of 1692 which is the 96th percentile.  EKG:  The ekg ordered today demonstrates sinus rhythm.  QTc is 400 ms.  Ventricular rate 48 bpm.  Recent Labs: 09/02/2021: BUN 18; Creatinine, Ser 1.05; Hemoglobin 11.5; Platelets 234; Potassium 3.8; Sodium 139  Recent Lipid Panel No results found for: CHOL, TRIG, HDL, CHOLHDL, VLDL, LDLCALC, LDLDIRECT  Physical Exam:    VS:  BP 128/68   Pulse (!) 48   Ht 5' (1.524 m)   Wt 151 lb (68.5 kg)   BMI 29.49 kg/m     Wt Readings from Last 3 Encounters:  12/22/21 151 lb (68.5 kg)  10/19/21 154 lb 6.4 oz (70 kg)  10/15/21 152 lb (68.9 kg)     GEN:  Well nourished, well developed in no acute distress HEENT: Normal NECK: No JVD; No carotid bruits LYMPHATICS: No lymphadenopathy CARDIAC: RRR, no murmurs, rubs, gallops RESPIRATORY:  Clear to auscultation without rales, wheezing or rhonchi  ABDOMEN: Soft, non-tender, non-distended MUSCULOSKELETAL:  No edema; No deformity  SKIN: Warm and dry NEUROLOGIC:  Alert and oriented x 3 PSYCHIATRIC:  Normal affect        ASSESSMENT:    1. Paroxysmal atrial fibrillation (HCC)   2. Typical angina (HCC)    PLAN:    In order of problems listed above:  #Paroxysmal atrial fibrillation Symptomatic salvos of atrial fibrillation continue despite ablation.  Amiodarone is not an option given prior thyroid toxicity.  She has been off of amiodarone for months now and her QTc is 400 ms.  I would like to start her on Tikosyn.  I discussed Tikosyn in detail with the patient including the need for inpatient monitoring upon initiation and she is willing to proceed.  We will get this scheduled for her.  In the meantime she will continue Eliquis.  She  would likely need to have her metoprolol stopped as we initiate Tikosyn given her resting heart rate in the 40s.  #Angina Typical symptoms when she is having rapid heart rates while in atrial fibrillation.  She has a very elevated coronary artery calcium score on recent CT scan.  She has had prior stress test in the [facility] system which have not shown ischemia.  I would like to proceed directly to left heart catheterization.  I discussed the heart catheterization procedure in detail with the patient  including the risks and recovery and she wishes to proceed.  I will start her on an aspirin 81 mg by mouth once daily.  She will continue her Lipitor 20 mg by mouth daily.   Follow-up based on above.  Medication Adjustments/Labs and Tests Ordered: Current medicines are reviewed at length with the patient today.  Concerns regarding medicines are outlined above.  No orders of the defined types were placed in this encounter.  No orders of the defined types were placed in this encounter.    Signed, Lars Mage, MD, [facility], [facility] 12/22/2021 4:28 PM    Electrophysiology [facility]

## 2021-12-23 ENCOUNTER — Telehealth: Payer: Self-pay | Admitting: Pharmacist

## 2021-12-23 NOTE — Telephone Encounter (Signed)
Medication list reviewed in anticipation of upcoming Tikosyn initiation. Patient is not taking any contraindicated or QTc prolonging medications.   Patient is anticoagulated on Eliquis 5mg BID on the appropriate dose. Please ensure that patient has not missed any anticoagulation doses in the 3 weeks prior to Tikosyn initiation.   Patient will need to be counseled to avoid use of Benadryl while on Tikosyn and in the 2-3 days prior to Tikosyn initiation. 

## 2021-12-24 ENCOUNTER — Encounter: Payer: Self-pay | Admitting: Cardiology

## 2021-12-30 ENCOUNTER — Encounter: Payer: Self-pay | Admitting: Internal Medicine

## 2021-12-30 ENCOUNTER — Ambulatory Visit
Admission: RE | Admit: 2021-12-30 | Discharge: 2021-12-30 | Disposition: A | Discharge status: Home / Self Care | Payer: Medicare HMO | Source: Ambulatory Visit | Attending: Internal Medicine | Admitting: Internal Medicine

## 2021-12-30 ENCOUNTER — Encounter
Admission: RE | Disposition: A | Discharge status: Home / Self Care | Payer: Self-pay | Source: Ambulatory Visit | Attending: Internal Medicine

## 2021-12-30 ENCOUNTER — Other Ambulatory Visit: Payer: Self-pay

## 2021-12-30 DIAGNOSIS — R079 Chest pain, unspecified: Secondary | ICD-10-CM

## 2021-12-30 DIAGNOSIS — Z79899 Other long term (current) drug therapy: Secondary | ICD-10-CM | POA: Insufficient documentation

## 2021-12-30 DIAGNOSIS — I25118 Atherosclerotic heart disease of native coronary artery with other forms of angina pectoris: Secondary | ICD-10-CM | POA: Diagnosis not present

## 2021-12-30 DIAGNOSIS — I480 Paroxysmal atrial fibrillation: Secondary | ICD-10-CM | POA: Insufficient documentation

## 2021-12-30 DIAGNOSIS — Z7901 Long term (current) use of anticoagulants: Secondary | ICD-10-CM | POA: Diagnosis not present

## 2021-12-30 DIAGNOSIS — I2510 Atherosclerotic heart disease of native coronary artery without angina pectoris: Secondary | ICD-10-CM

## 2021-12-30 HISTORY — PX: LEFT HEART CATH AND CORONARY ANGIOGRAPHY: CATH118249

## 2021-12-30 LAB — CBC
HCT: 38.1 % (ref 36.0–46.0)
Hemoglobin: 12 g/dL (ref 12.0–15.0)
MCH: 30.3 pg (ref 26.0–34.0)
MCHC: 31.5 g/dL (ref 30.0–36.0)
MCV: 96.2 fL (ref 80.0–100.0)
Platelets: 240 10*3/uL (ref 150–400)
RBC: 3.96 MIL/uL (ref 3.87–5.11)
RDW: 15.1 % (ref 11.5–15.5)
WBC: 7.1 10*3/uL (ref 4.0–10.5)
nRBC: 0 % (ref 0.0–0.2)

## 2021-12-30 SURGERY — LEFT HEART CATH AND CORONARY ANGIOGRAPHY
Anesthesia: Moderate Sedation

## 2021-12-30 MED ORDER — HEPARIN (PORCINE) IN NACL 2000-0.9 UNIT/L-% IV SOLN
INTRAVENOUS | Status: DC | PRN
  Administered 2021-12-30: 1000 mL

## 2021-12-30 MED ORDER — METOPROLOL TARTRATE 5 MG/5ML IV SOLN
INTRAVENOUS | Status: DC | PRN
  Administered 2021-12-30: 5 mg via INTRAVENOUS

## 2021-12-30 MED ORDER — ONDANSETRON HCL 4 MG/2ML IJ SOLN: 4.0000 mg | Freq: Four times a day (QID) | INTRAMUSCULAR | Status: DC | PRN

## 2021-12-30 MED ORDER — VERAPAMIL HCL 2.5 MG/ML IV SOLN
INTRAVENOUS | Status: AC
  Filled 2021-12-30: qty 2

## 2021-12-30 MED ORDER — SODIUM CHLORIDE 0.9% FLUSH: 3.0000 mL | INTRAVENOUS | Status: DC | PRN

## 2021-12-30 MED ORDER — SODIUM CHLORIDE 0.9 % WEIGHT BASED INFUSION: 1.0000 mL/kg/h | INTRAVENOUS | Status: DC

## 2021-12-30 MED ORDER — SODIUM CHLORIDE 0.9 % IV SOLN: INTRAVENOUS | Status: DC

## 2021-12-30 MED ORDER — METOPROLOL TARTRATE 5 MG/5ML IV SOLN
INTRAVENOUS | Status: AC
  Filled 2021-12-30: qty 5

## 2021-12-30 MED ORDER — ASPIRIN 81 MG PO CHEW: 81.0000 mg | CHEWABLE_TABLET | ORAL | Status: DC

## 2021-12-30 MED ORDER — SODIUM CHLORIDE 0.9 % WEIGHT BASED INFUSION
3.0000 mL/kg/h | INTRAVENOUS | Status: AC
  Administered 2021-12-30: 3 mL/kg/h via INTRAVENOUS

## 2021-12-30 MED ORDER — LIDOCAINE HCL 1 % IJ SOLN
INTRAMUSCULAR | Status: AC
  Filled 2021-12-30: qty 20

## 2021-12-30 MED ORDER — SODIUM CHLORIDE 0.9% FLUSH: 3.0000 mL | Freq: Two times a day (BID) | INTRAVENOUS | Status: DC

## 2021-12-30 MED ORDER — FENTANYL CITRATE (PF) 100 MCG/2ML IJ SOLN
INTRAMUSCULAR | Status: DC | PRN
Start: 2021-12-30 — End: 2021-12-30
  Administered 2021-12-30: 12.5 ug via INTRAVENOUS

## 2021-12-30 MED ORDER — LIDOCAINE HCL (PF) 1 % IJ SOLN
INTRAMUSCULAR | Status: DC | PRN
  Administered 2021-12-30: 2 mL

## 2021-12-30 MED ORDER — HYDRALAZINE HCL 20 MG/ML IJ SOLN: 10.0000 mg | INTRAMUSCULAR | Status: DC | PRN

## 2021-12-30 MED ORDER — SODIUM CHLORIDE 0.9 % IV SOLN: 250.0000 mL | INTRAVENOUS | Status: DC | PRN

## 2021-12-30 MED ORDER — HEPARIN (PORCINE) IN NACL 1000-0.9 UT/500ML-% IV SOLN
INTRAVENOUS | Status: AC
  Filled 2021-12-30: qty 1000

## 2021-12-30 MED ORDER — LABETALOL HCL 5 MG/ML IV SOLN: 10.0000 mg | INTRAVENOUS | Status: DC | PRN

## 2021-12-30 MED ORDER — HEPARIN SODIUM (PORCINE) 1000 UNIT/ML IJ SOLN
INTRAMUSCULAR | Status: DC | PRN
  Administered 2021-12-30: 3500 [IU] via INTRAVENOUS

## 2021-12-30 MED ORDER — VERAPAMIL HCL 2.5 MG/ML IV SOLN
INTRAVENOUS | Status: DC | PRN
  Administered 2021-12-30: 2.5 mg via INTRA_ARTERIAL

## 2021-12-30 MED ORDER — HEPARIN SODIUM (PORCINE) 1000 UNIT/ML IJ SOLN
INTRAMUSCULAR | Status: AC
  Filled 2021-12-30: qty 10

## 2021-12-30 MED ORDER — IOHEXOL 300 MG/ML  SOLN
INTRAMUSCULAR | Status: DC | PRN
  Administered 2021-12-30: 43 mL

## 2021-12-30 MED ORDER — MIDAZOLAM HCL 2 MG/2ML IJ SOLN
INTRAMUSCULAR | Status: AC
  Filled 2021-12-30: qty 2

## 2021-12-30 MED ORDER — MIDAZOLAM HCL 2 MG/2ML IJ SOLN
INTRAMUSCULAR | Status: DC | PRN
  Administered 2021-12-30: .5 mg via INTRAVENOUS

## 2021-12-30 MED ORDER — FENTANYL CITRATE (PF) 100 MCG/2ML IJ SOLN
INTRAMUSCULAR | Status: AC
  Filled 2021-12-30: qty 2

## 2021-12-30 SURGICAL SUPPLY — 13 items
CATH 5F 110X4 TIG (CATHETERS) ×1 IMPLANT
CATH 5FR PIGTAIL DIAGNOSTIC (CATHETERS) ×1 IMPLANT
DEVICE RAD TR BAND REGULAR (VASCULAR PRODUCTS) ×1 IMPLANT
DRAPE BRACHIAL (DRAPES) ×1 IMPLANT
GLIDESHEATH SLEND SS 6F .021 (SHEATH) IMPLANT
GUIDEWIRE INQWIRE 1.5J.035X260 (WIRE) IMPLANT
INQWIRE 1.5J .035X260CM (WIRE) ×2
PACK CARDIAC CATH (CUSTOM PROCEDURE TRAY) ×2 IMPLANT
PROTECTION STATION PRESSURIZED (MISCELLANEOUS) ×2
SET ATX SIMPLICITY (MISCELLANEOUS) ×1 IMPLANT
SHEATH GLIDE SLENDER 4/5FR (SHEATH) ×1 IMPLANT
STATION PROTECTION PRESSURIZED (MISCELLANEOUS) IMPLANT
WIRE HITORQ VERSACORE ST 145CM (WIRE) ×1 IMPLANT

## 2021-12-30 NOTE — Interval H&P Note (Signed)
History and Physical Interval Note:  12/30/2021 8:49 AM  Charlene Coleman  has presented today for surgery, with the diagnosis of chest pain.  The various methods of treatment have been discussed with the patient and family. After consideration of risks, benefits and other options for treatment, the patient has consented to  Procedure(s): LEFT HEART CATH AND CORONARY ANGIOGRAPHY (N/A) as a surgical intervention.  The patient's history has been reviewed, patient examined, no change in status, stable for surgery.  I have reviewed the patient's chart and labs.  Questions were answered to the patient's satisfaction.    Cath Lab Visit (complete for each Cath Lab visit)  Clinical Evaluation Leading to the Procedure:   ACS: No.  Non-ACS:    Anginal Classification: CCS III  Anti-ischemic medical therapy: Minimal Therapy (1 class of medications)  Non-Invasive Test Results: No non-invasive testing performed  Prior CABG: No previous CABG  Garison [place]

## 2021-12-31 NOTE — Telephone Encounter (Signed)
Pt resumed eliquis post cath on 6/2 morning dose.

## 2022-01-12 ENCOUNTER — Encounter: Payer: Self-pay | Admitting: Nurse Practitioner

## 2022-01-12 ENCOUNTER — Ambulatory Visit: Payer: Medicare HMO | Admitting: Nurse Practitioner

## 2022-01-12 VITALS — BP 144/70 | HR 51 | Ht 60.0 in | Wt 150.2 lb

## 2022-01-12 DIAGNOSIS — I10 Essential (primary) hypertension: Secondary | ICD-10-CM | POA: Diagnosis not present

## 2022-01-12 DIAGNOSIS — E782 Mixed hyperlipidemia: Secondary | ICD-10-CM

## 2022-01-12 DIAGNOSIS — I2510 Atherosclerotic heart disease of native coronary artery without angina pectoris: Secondary | ICD-10-CM | POA: Diagnosis not present

## 2022-01-12 DIAGNOSIS — I5032 Chronic diastolic (congestive) heart failure: Secondary | ICD-10-CM

## 2022-01-12 DIAGNOSIS — I480 Paroxysmal atrial fibrillation: Secondary | ICD-10-CM | POA: Diagnosis not present

## 2022-01-12 NOTE — Progress Notes (Signed)
Office Visit    Patient Name: Charlene Coleman Date of Encounter: 01/12/2022  Primary Care Provider:  Dion Body, MD Primary Cardiologist:  Kate Sable, MD/EP: Lars Mage, MD  Chief Complaint    81 year old female with a history of paroxysmal atrial fibrillation status post catheter ablation, chronic HFpEF, nonobstructive CAD, hypertension, and hyperlipidemia, who presents for follow-up after recent diagnostic catheterization.  Past Medical History    Past Medical History:  Diagnosis Date   (HFpEF) heart failure with preserved ejection fraction ([facility])    a. 06/2021 Echo: EF 60-65%, no rwma, mild asymm LVH of basal-septal segment. Nl RV fxn. RVSP 23.58mHg. Mild MR. AoV sclerosis w/o stenosis.   Aortic valve sclerosis    a. 06/2021 Echo: Ao sclerosis w/o stenosis.   Borderline diabetes    [place]ast cancer ([facility] [place]   Left side - radiation - lumpectomy   CAD (coronary artery disease)    a. 12/2021 Cath: LM nl, LAD 25p/m, D1 25, RI nl, LCX nl, RCA 20/30p, 564mMed rx.   DCIS (ductal carcinoma in situ)    Empty sella syndrome (HCC)    GERD (gastroesophageal reflux disease)    Gout    Hyperlipidemia    Hypertension    [place]mal [place]) [place] [place]   a. Amio stopped 2/2 thyroid dysfunction; b. 09/2021 s/p PVI.   Personal history of radiation therapy    Skin cancer 2011   Past Surgical History:  Procedure Laterality Date   ABDOMINAL HYSTERECTOMY     ATRIAL FIBRILLATION ABLATION N/A 09/21/2021   Procedure: ATRIAL FIBRILLATION ABLATION;  Surgeon: LaVickie EpleyMD;  [facility];  Service: Cardiovascular;  Laterality: N/A;   BREAST BIOPSY Left 2015   positive   BREAST LUMPECTOMY Left 09/23/2013   CATARACT EXTRACTION Bilateral    CHOLECYSTECTOMY     ELECTROPHYSIOLOGIC STUDY     GASTRIC FUNDOPLICATION     LEFT HEART CATH AND CORONARY ANGIOGRAPHY N/A 12/30/2021   Procedure: LEFT HEART CATH AND CORONARY ANGIOGRAPHY;  Surgeon: EnNelva BushMD;  [facility];  Service: Cardiovascular;  Laterality: N/A;   left lumpectomy      Allergies  Allergies  Allergen Reactions   Enalapril Cough   Amiodarone     Thyroid dysfunction   Tylenol [Acetaminophen] Tinitus    History of Present Illness    [age]ear old female with the above past medical history including nonobstructive CAD, paroxysmal atrial fibrillation, chronic HFpEF, hypertension, and hyperlipidemia.  She previously underwent diagnostic catheterization in February 2009, which showed nonobstructive first diagonal and RCA disease.  More recently, she has been followed secondary to HFpEF and paroxysmal atrial fibrillation.  She was previous on amiodarone however, this was discontinued secondary to thyroid dysfunction.  In the fall 2022, she noted more frequent paroxysms of atrial fibrillation, and in February 2023, she underwent PVI.  At office follow-up on May 24, she reported symptomatic paroxysms of atrial fibrillation associated with chest pressure.  As such, decision was made to pursue diagnostic catheterization, and it was also felt that she would benefit from initiation of Tikosyn.  Diagnostic catheterization was performed on June 1, showing moderate, nonobstructive disease with recommendation for ongoing medical therapy.  She is currently scheduled for A-fib clinic follow-up on June 26 to arrange for Tikosyn loading.  Since her catheterization, she has done relatively well.  She did have one prolonged episode of atrial fibrillation last Friday night associated with back pain and throat fullness, as she notes  episodes since her ablation have been.  Like others, this episode resolved spontaneously after a few hours.  She has had no recurrence since.  She denies PND, orthopnea, dizziness, syncope, or early satiety.  She has been taking Lasix once a day and notes that if she does not, she will have mild lower extremity swelling.  Home Medications     Current Outpatient Medications  Medication Sig Dispense Refill   allopurinol (ZYLOPRIM) 300 MG tablet Take 300 mg by mouth daily.      apixaban (ELIQUIS) 5 MG TABS tablet TAKE 1 TABLET TWICE DAILY 180 tablet 1   atorvastatin (LIPITOR) 20 MG tablet Take 20 mg by mouth every evening.     Calcium Carbonate-Vitamin D 600-400 MG-UNIT per tablet Take 1 tablet by mouth 3 (three) times a week.      carboxymethylcellulose (REFRESH PLUS) 0.5 % SOLN Place 1 drop into both eyes daily as needed (dry eyes).     furosemide (LASIX) 20 MG tablet Take 1 tablet (20 mg total) by mouth daily as needed. For swelling. 90 tablet 0   gabapentin (NEURONTIN) 100 MG capsule 300 mg at bedtime.     irbesartan (AVAPRO) 300 MG tablet Take 1 tablet (300 mg total) by mouth daily. 90 tablet 3   methocarbamol (ROBAXIN) 500 MG tablet Take 250 mg by mouth in the morning and at bedtime.     metoprolol succinate (TOPROL-XL) 25 MG 24 hr tablet Take 12.5 mg by mouth daily.     oxybutynin (DITROPAN-XL) 5 MG 24 hr tablet Take 5 mg by mouth in the morning.     levothyroxine (SYNTHROID) 88 MCG tablet Take 88 mcg by mouth daily before breakfast.     No current facility-administered medications for this visit.     Review of Systems    1 episode of atrial fibrillation since her diagnostic catheterization.  This was associated back pain and throat fullnes.  She denies chest pain, dyspnea, PND, orthopnea, dizziness, syncope, or early satiety.  She does have mild lower extremity swelling for which she uses Lasix.  All other systems reviewed and are otherwise negative except as noted above.    Physical Exam    VS:  BP (!) 144/70   Pulse (!) 51   Ht 5' (1.524 m)   Wt 150 lb 3.2 oz (68.1 kg)   SpO2 95%   BMI 29.33 kg/m  , BMI Body mass index is 29.33 kg/m.     Vitals:   01/12/22 1035 01/12/22 1107  BP: (!) 150/70 (!) 144/70  Pulse: (!) 51   SpO2: 95%     GEN: Well nourished, well developed, in no acute distress. HEENT:  normal. Neck: Supple, no JVD, carotid bruits, or masses. Cardiac: RRR, no murmurs, rubs, or gallops. No clubbing, cyanosis, edema.  Radials/PT 2+ and equal bilaterally.  Right radial catheterization site without bleeding, bruit, or hematoma. Respiratory:  Respirations regular and unlabored, clear to auscultation bilaterally. GI: Soft, nontender, nondistended, BS + x 4. MS: no deformity or atrophy. Skin: warm and dry, no rash. Neuro:  Strength and sensation are intact. Psych: Normal affect.  Accessory Clinical Findings    ECG personally reviewed by me today -sinus bradycardia, 51, nonspecific ST/T changes- no acute changes.  Lab Results  Component Value Date   WBC 7.1 12/30/2021   HGB 12.0 12/30/2021   HCT 38.1 12/30/2021   MCV 96.2 12/30/2021   PLT 240 12/30/2021   Lab Results  Component Value Date   CREATININE 1.05 (  H) 09/02/2021   BUN 18 09/02/2021   NA 139 09/02/2021   K 3.8 09/02/2021   CL 103 09/02/2021   CO2 23 09/02/2021   Labs dated Dec 06, 2021 from care everywhere  Total cholesterol 140, triglycerides 100, HDL 48.7, LDL 71 _____________   Assessment & Plan    1.  Paroxysmal atrial fibrillation: Status post catheter ablation/PVI in February 2023 with recurrent paroxysms since then.  Most recent was last Friday, occurring at bedtime, which she says is typical.  Symptoms resolved spontaneously after several hours.  She does experience back pain and fullness in her throat during episodes and recent underwent diagnostic catheterization which showed moderate, nonobstructive CAD.  She has follow-up planned with A-fib clinic on June 26 for lab work and admission for Tikosyn loading.  QTc is stable today at 412 ms.  She is on low-dose metoprolol in the setting of baseline sinus bradycardia.  2.  Nonobstructive CAD: As above, recent diagnostic catheterization in early June secondary to chest and back pain occurring in the setting of atrial fibrillation.  She had nonobstructive  disease on diagnostic catheterization and is being medically managed.  She does not experience symptoms outside of atrial fibrillation.  She remains on statin therapy and low-dose beta-blocker.  No aspirin in the setting of chronic Eliquis.  3.  Chronic HFpEF: Euvolemic on examination.  She uses Lasix 20 mg once daily to prevent mild lower extremity edema.  Blood pressure is moderately elevated today at 144/70 on repeat.  She does not know what it runs at home.  I have recommended that she check her blood pressure daily at home for the next 1 to 2 weeks and contact us with trends.  I suspect, she may require addition of low-dose amlodipine.  She is on max dose irbesartan and low-dose metoprolol with dose limited by bradycardia.  4.  Essential hypertension: See #3.  She will send [place] blood pressure trends and we will need to consider adding a low-dose of amlodipine if appropriate.  5.  Hyperlipidemia: LDL of 71 in May.  Continue statin therapy.  6.  Disposition: Follow-up in A-fib clinic as planned on June 26 with general cardiology follow-up scheduled in March.   Murray Hodgkins, NP 01/12/2022, 12:21 PM

## 2022-01-12 NOTE — Patient Instructions (Signed)
Please monitor blood pressures and keep a log of your readings.   Make sure to check 2 hours after your medications.   AVOID these things for 30 minutes before checking your blood pressure: No Drinking caffeine. No Drinking alcohol. No Eating. No Smoking. No Exercising.  Five minutes before checking your blood pressure: Pee. Sit in a dining chair. Avoid sitting in a soft couch or armchair. Be quiet. Do not talk.   Medication Instructions:  No changes at this time.   *If you need a refill on your cardiac medications before your next appointment, please call your pharmacy*   Lab Work: None  If you have labs (blood work) drawn today and your tests are completely normal, you will receive your results only by: [facility] (if you have MyChart) OR A paper copy in the mail If you have any lab test that is abnormal or we need to change your treatment, we will call you to review the results.   Testing/Procedures: None   Follow-Up: At [facility], you and your health needs are our priority.  As part of our continuing mission to provide you with exceptional heart care, we have created designated Provider Care Teams.  These Care Teams include your primary Cardiologist (physician) and Advanced Practice Providers (APPs -  Physician Assistants and Nurse Practitioners) who all work together to provide you with the care you need, when you need it.   Keep your next scheduled appointments:    01/24/22 at 11:00 am with the Afib clinic  04/25/22 at 11:00 am with Dr. Garen Lah   Important Information About [place]

## 2022-01-24 ENCOUNTER — Inpatient Hospital Stay (HOSPITAL_COMMUNITY)
Admission: RE | Admit: 2022-01-24 | Discharge: 2022-01-27 | DRG: 309 | Disposition: A | Discharge status: Home / Self Care | Payer: Medicare HMO | Source: Ambulatory Visit | Attending: Cardiology | Admitting: Cardiology

## 2022-01-24 ENCOUNTER — Other Ambulatory Visit: Payer: Self-pay

## 2022-01-24 ENCOUNTER — Encounter (HOSPITAL_COMMUNITY): Payer: Self-pay | Admitting: Cardiology

## 2022-01-24 ENCOUNTER — Other Ambulatory Visit (HOSPITAL_COMMUNITY): Payer: Self-pay

## 2022-01-24 ENCOUNTER — Ambulatory Visit (HOSPITAL_COMMUNITY)
Admission: RE | Admit: 2022-01-24 | Discharge: 2022-01-24 | Disposition: A | Discharge status: Home / Self Care | Payer: Medicare HMO | Source: Ambulatory Visit | Attending: Physician Assistant | Admitting: Physician Assistant

## 2022-01-24 VITALS — BP 156/76 | HR 47 | Ht 60.0 in | Wt 150.8 lb

## 2022-01-24 DIAGNOSIS — D6869 Other thrombophilia: Secondary | ICD-10-CM | POA: Diagnosis present

## 2022-01-24 DIAGNOSIS — Z803 Family history of malignant neoplasm of breast: Secondary | ICD-10-CM

## 2022-01-24 DIAGNOSIS — Z923 Personal history of irradiation: Secondary | ICD-10-CM

## 2022-01-24 DIAGNOSIS — I5032 Chronic diastolic (congestive) heart failure: Secondary | ICD-10-CM | POA: Diagnosis not present

## 2022-01-24 DIAGNOSIS — I110 Hypertensive heart disease with heart failure: Secondary | ICD-10-CM | POA: Diagnosis not present

## 2022-01-24 DIAGNOSIS — K219 Gastro-esophageal reflux disease without esophagitis: Secondary | ICD-10-CM | POA: Diagnosis not present

## 2022-01-24 DIAGNOSIS — Z79899 Other long term (current) drug therapy: Secondary | ICD-10-CM

## 2022-01-24 DIAGNOSIS — E785 Hyperlipidemia, unspecified: Secondary | ICD-10-CM | POA: Diagnosis not present

## 2022-01-24 DIAGNOSIS — I480 Paroxysmal atrial fibrillation: Principal | ICD-10-CM

## 2022-01-24 DIAGNOSIS — I4819 Other persistent atrial fibrillation: Principal | ICD-10-CM | POA: Diagnosis present

## 2022-01-24 DIAGNOSIS — Z853 Personal history of malignant neoplasm of breast: Secondary | ICD-10-CM

## 2022-01-24 DIAGNOSIS — I2510 Atherosclerotic heart disease of native coronary artery without angina pectoris: Secondary | ICD-10-CM | POA: Diagnosis present

## 2022-01-24 DIAGNOSIS — Z85828 Personal history of other malignant neoplasm of skin: Secondary | ICD-10-CM | POA: Diagnosis not present

## 2022-01-24 DIAGNOSIS — Z7901 Long term (current) use of anticoagulants: Secondary | ICD-10-CM

## 2022-01-24 DIAGNOSIS — Z90710 Acquired absence of both cervix and uterus: Secondary | ICD-10-CM | POA: Diagnosis not present

## 2022-01-24 DIAGNOSIS — I4891 Unspecified atrial fibrillation: Secondary | ICD-10-CM | POA: Diagnosis present

## 2022-01-24 DIAGNOSIS — M109 Gout, unspecified: Secondary | ICD-10-CM | POA: Diagnosis present

## 2022-01-24 DIAGNOSIS — Z79890 Hormone replacement therapy (postmenopausal): Secondary | ICD-10-CM

## 2022-01-24 DIAGNOSIS — E876 Hypokalemia: Secondary | ICD-10-CM | POA: Diagnosis present

## 2022-01-24 HISTORY — DX: Dyspnea, unspecified: R06.00

## 2022-01-24 HISTORY — DX: Cardiac arrhythmia, unspecified: I49.9

## 2022-01-24 LAB — BASIC METABOLIC PANEL
Anion gap: 7 (ref 5–15)
BUN: 14 mg/dL (ref 8–23)
CO2: 26 mmol/L (ref 22–32)
Calcium: 9.4 mg/dL (ref 8.9–10.3)
Chloride: 106 mmol/L (ref 98–111)
Creatinine, Ser: 0.92 mg/dL (ref 0.44–1.00)
GFR, Estimated: >60 mL/min (ref >60)
Glucose, Bld: 95 mg/dL (ref 70–99)
Potassium: 4.2 mmol/L (ref 3.5–5.1)
Sodium: 139 mmol/L (ref 135–145)

## 2022-01-24 LAB — MAGNESIUM: Magnesium: 2.1 mg/dL (ref 1.7–2.4)

## 2022-01-24 MED ORDER — ALLOPURINOL 300 MG PO TABS
300.0000 mg | ORAL_TABLET | Freq: Every day | ORAL | Status: DC
  Administered 2022-01-25 – 2022-01-27 (×3): 300 mg via ORAL
  Filled 2022-01-24 (×3): qty 1

## 2022-01-24 MED ORDER — OFF THE BEAT BOOK
Freq: Once | Status: AC
  Filled 2022-01-24: qty 1

## 2022-01-24 MED ORDER — LEVOTHYROXINE SODIUM 88 MCG PO TABS
88.0000 ug | ORAL_TABLET | Freq: Every day | ORAL | Status: DC
  Administered 2022-01-25 – 2022-01-27 (×3): 88 ug via ORAL
  Filled 2022-01-24 (×3): qty 1

## 2022-01-24 MED ORDER — SODIUM CHLORIDE 0.9% FLUSH: 3.0000 mL | INTRAVENOUS | Status: DC | PRN

## 2022-01-24 MED ORDER — FUROSEMIDE 20 MG PO TABS
20.0000 mg | ORAL_TABLET | Freq: Every day | ORAL | Status: DC
  Administered 2022-01-25 – 2022-01-27 (×3): 20 mg via ORAL
  Filled 2022-01-24 (×3): qty 1

## 2022-01-24 MED ORDER — SODIUM CHLORIDE 0.9 % IV SOLN: 250.0000 mL | INTRAVENOUS | Status: DC | PRN

## 2022-01-24 MED ORDER — FUROSEMIDE 20 MG PO TABS: 20.0000 mg | ORAL_TABLET | Freq: Every day | ORAL | Status: DC

## 2022-01-24 MED ORDER — FAMOTIDINE 20 MG PO TABS
20.0000 mg | ORAL_TABLET | Freq: Every day | ORAL | Status: DC
  Administered 2022-01-24 – 2022-01-26 (×3): 20 mg via ORAL
  Filled 2022-01-24 (×3): qty 1

## 2022-01-24 MED ORDER — SODIUM CHLORIDE 0.9% FLUSH
3.0000 mL | Freq: Two times a day (BID) | INTRAVENOUS | Status: DC
  Administered 2022-01-24 – 2022-01-27 (×7): 3 mL via INTRAVENOUS

## 2022-01-24 MED ORDER — IRBESARTAN 150 MG PO TABS
300.0000 mg | ORAL_TABLET | Freq: Every day | ORAL | Status: DC
  Administered 2022-01-25 – 2022-01-27 (×3): 300 mg via ORAL
  Filled 2022-01-24 (×3): qty 2

## 2022-01-24 MED ORDER — APIXABAN 5 MG PO TABS
5.0000 mg | ORAL_TABLET | Freq: Two times a day (BID) | ORAL | Status: DC
  Administered 2022-01-24 – 2022-01-27 (×6): 5 mg via ORAL
  Filled 2022-01-24 (×6): qty 1

## 2022-01-24 MED ORDER — OXYBUTYNIN CHLORIDE ER 5 MG PO TB24
5.0000 mg | ORAL_TABLET | Freq: Every morning | ORAL | Status: DC
Start: 2022-01-25 — End: 2022-01-27
  Administered 2022-01-25 – 2022-01-27 (×3): 5 mg via ORAL
  Filled 2022-01-24 (×3): qty 1

## 2022-01-24 MED ORDER — METOPROLOL SUCCINATE ER 25 MG PO TB24
12.5000 mg | ORAL_TABLET | Freq: Every day | ORAL | Status: DC
  Administered 2022-01-25 – 2022-01-26 (×2): 12.5 mg via ORAL
  Filled 2022-01-24 (×2): qty 1

## 2022-01-24 MED ORDER — ATORVASTATIN CALCIUM 10 MG PO TABS
20.0000 mg | ORAL_TABLET | Freq: Every evening | ORAL | Status: DC
  Administered 2022-01-24 – 2022-01-26 (×3): 20 mg via ORAL
  Filled 2022-01-24 (×3): qty 2

## 2022-01-24 MED ORDER — DOFETILIDE 250 MCG PO CAPS
250.0000 ug | ORAL_CAPSULE | Freq: Two times a day (BID) | ORAL | Status: DC
Start: 2022-01-24 — End: 2022-01-27
  Administered 2022-01-24 – 2022-01-27 (×6): 250 ug via ORAL
  Filled 2022-01-24 (×6): qty 1

## 2022-01-25 DIAGNOSIS — I480 Paroxysmal atrial fibrillation: Secondary | ICD-10-CM | POA: Diagnosis not present

## 2022-01-25 LAB — BASIC METABOLIC PANEL
Anion gap: 12 (ref 5–15)
BUN: 19 mg/dL (ref 8–23)
CO2: 20 mmol/L — ABNORMAL LOW (ref 22–32)
Calcium: 9 mg/dL (ref 8.9–10.3)
Chloride: 106 mmol/L (ref 98–111)
Creatinine, Ser: 0.99 mg/dL (ref 0.44–1.00)
GFR, Estimated: 58 mL/min — ABNORMAL LOW (ref >60)
Glucose, Bld: 74 mg/dL (ref 70–99)
Potassium: 4.2 mmol/L (ref 3.5–5.1)
Sodium: 138 mmol/L (ref 135–145)

## 2022-01-25 LAB — MAGNESIUM: Magnesium: 1.9 mg/dL (ref 1.7–2.4)

## 2022-01-25 MED ORDER — MAGNESIUM SULFATE 2 GM/50ML IV SOLN
2.0000 g | Freq: Once | INTRAVENOUS | Status: AC
Start: 2022-01-25 — End: 2022-01-25
  Administered 2022-01-25: 2 g via INTRAVENOUS
  Filled 2022-01-25: qty 50

## 2022-01-25 NOTE — Progress Notes (Signed)
Mobility Specialist Progress Note    01/25/22 1725  Mobility  Activity Ambulated independently in hallway  Level of Assistance Independent  Assistive Device None  Distance Ambulated (ft) 800 ft  Activity Response Tolerated well  $Mobility charge 1 Mobility   Pre-Mobility: 48 HR During Mobility: 77 HR Post-Mobility: 63 HR  Pt received in chair and agreeable. No complaints on walk. Returned to chair with call bell in reach.    [place] Mobility Specialist

## 2022-01-26 DIAGNOSIS — I480 Paroxysmal atrial fibrillation: Secondary | ICD-10-CM | POA: Diagnosis not present

## 2022-01-26 LAB — BASIC METABOLIC PANEL
Anion gap: 7 (ref 5–15)
BUN: 14 mg/dL (ref 8–23)
CO2: 23 mmol/L (ref 22–32)
Calcium: 9.1 mg/dL (ref 8.9–10.3)
Chloride: 107 mmol/L (ref 98–111)
Creatinine, Ser: 0.82 mg/dL (ref 0.44–1.00)
GFR, Estimated: >60 mL/min (ref >60)
Glucose, Bld: 84 mg/dL (ref 70–99)
Potassium: 3.4 mmol/L — ABNORMAL LOW (ref 3.5–5.1)
Sodium: 137 mmol/L (ref 135–145)

## 2022-01-26 LAB — MAGNESIUM: Magnesium: 2.4 mg/dL (ref 1.7–2.4)

## 2022-01-26 MED ORDER — POTASSIUM CHLORIDE CRYS ER 20 MEQ PO TBCR
40.0000 meq | EXTENDED_RELEASE_TABLET | ORAL | Status: AC
  Administered 2022-01-26 (×2): 40 meq via ORAL
  Filled 2022-01-26 (×2): qty 2

## 2022-01-26 NOTE — Progress Notes (Signed)
Morning EKG reviewed    Shows remains in NSR at 55 bpm with stable QTc at ~460-470 ms.  Continue  Tikosyn 250 mcg BID.   Plan for home tomorrow if QTc remains stable.   Shirley Friar, PA-C  Pager: [phone]  01/26/2022 11:24 AM

## 2022-01-26 NOTE — Progress Notes (Signed)
Pharmacy: Dofetilide (Tikosyn) - Follow Up Assessment and Electrolyte Replacement  Pharmacy consulted to assist in monitoring and replacing electrolytes in this 80 y.o. female admitted on 01/24/2022 undergoing dofetilide initiation.   Labs:    Component Value Date/Time   K 3.4 (L) 01/26/2022 0140   MG 2.4 01/26/2022 0140     Plan: Potassium: -40 meq x2 ordered per MD  Magnesium: Mg > 2: No additional supplementation needed   Thank you for allowing pharmacy to participate in this patient's care   Hildred Laser, PharmD Clinical Pharmacist **Pharmacist phone directory can now be found on [email or web address] ([email or web address]).  Listed under [facility] [place].

## 2022-01-26 NOTE — Progress Notes (Addendum)
Electrophysiology Rounding Note  Patient Name: Charlene Coleman Date of Encounter: 01/26/2022  Primary Cardiologist: Kate Sable, MD  Electrophysiologist: Vickie Epley, MD    Subjective   Pt  remains in NSR  on Tikosyn 250 mcg BID   QTc from EKG last pm shows stable QTc at ~440-450 when corrected for rate  The patient is doing well today.  At this time, the patient denies chest pain, shortness of breath, or any new concerns.  Inpatient Medications    Scheduled Meds:  allopurinol  300 mg Oral Daily   apixaban  5 mg Oral BID   atorvastatin  20 mg Oral QPM   dofetilide  250 mcg Oral BID   famotidine  20 mg Oral QHS   furosemide  20 mg Oral Daily   irbesartan  300 mg Oral Daily   levothyroxine  88 mcg Oral QAC breakfast   metoprolol succinate  12.5 mg Oral Daily   oxybutynin  5 mg Oral q AM   sodium chloride flush  3 mL Intravenous Q12H   Continuous Infusions:  sodium chloride     PRN Meds: sodium chloride, sodium chloride flush   Vital Signs    Vitals:   01/25/22 0737 01/25/22 1129 01/25/22 2025 01/26/22 0447  BP: 125/86 107/60 131/68 140/65  Pulse: (!) 58 (!) 52 (!) 55 (!) 47  Resp: 18 18    Temp: 97.7 F (36.5 C) 97.7 F (36.5 C) 98 F (36.7 C) 97.9 F (36.6 C)  TempSrc: Oral Oral Oral Oral  SpO2: 98% 97% 99% 99%  Weight:      Height:        Intake/Output Summary (Last 24 hours) at 01/26/2022 0705 Last data filed at 01/25/2022 2010 Gross per 24 hour  Intake 240 ml  Output --  Net 240 ml   Filed Weights   01/24/22 1208  Weight: 68.2 kg    Physical Exam    GEN- The patient is well appearing, alert and oriented x 3 today.   Head- normocephalic, atraumatic Eyes-  Sclera clear, conjunctiva pink Ears- hearing intact Oropharynx- clear Neck- supple Lungs- Clear to ausculation bilaterally, normal work of breathing Heart- Regular rate and rhythm, no murmurs, rubs or gallops GI- soft, NT, ND, + BS Extremities- no clubbing, cyanosis, or  edema Skin- no rash or lesion Psych- euthymic mood, full affect Neuro- strength and sensation are intact  Labs    CBC No results for input(s): "WBC", "NEUTROABS", "HGB", "HCT", "MCV", "PLT" in the last 72 hours. Basic Metabolic Panel Recent Labs    01/25/22 0049 01/26/22 0140  NA 138 137  K 4.2 3.4*  CL 106 107  CO2 20* 23  GLUCOSE 74 84  BUN 19 14  CREATININE 0.99 0.82  CALCIUM 9.0 9.1  MG 1.9 2.4    Potassium  Date/Time Value Ref Range Status  01/26/2022 01:40 AM 3.4 (L) 3.5 - 5.1 mmol/L Final    Comment:    DELTA CHECK NOTED   Magnesium  Date/Time Value Ref Range Status  01/26/2022 01:40 AM 2.4 1.7 - 2.4 mg/dL Final    Comment:    Performed at [facility], [place] [address], [place], [place] [address]    Telemetry    Sinus brady/NSR 40-60s (personally reviewed)  Radiology    No results found.   Patient Profile     Charlene Coleman is a 81 y.o. female with a past medical history significant for persistent atrial fibrillation.  They were  admitted for tikosyn load.   Assessment & Plan    Persistent atrial fibrillation Pt  remains in NSR/sinus brady  on Tikosyn 250 mcg BID  Continue Eliquis K 3.4, Mg 2.4. Supp K CHA2DS2VASC is at least 6.  2. Hypokalemia Supp 40 x 2.  Plan for home tomorrow if QTc remains stable.   For questions or updates, please contact [place] Please consult [email or web address] for contact info under Cardiology/STEMI.  Signed, Shirley Friar, PA-C  01/26/2022, 7:05 AM   I have seen and examined this patient with Oda Kilts.  Agree with above, note added to reflect my findings.  Feeling well though has a headache.  No other complaints.  GEN: Well nourished, well developed, in no acute distress  HEENT: normal  Neck: no JVD, carotid bruits, or masses Cardiac: RRR; no murmurs, rubs, or gallops,no edema  Respiratory:  clear to auscultation bilaterally, normal work of breathing GI: soft, nontender, nondistended, +  BS MS: no deformity or atrophy  Skin: warm and dry Neuro:  Strength and sensation are intact Psych: euthymic mood, full affect   Persistent atrial fibrillation: Remains in sinus rhythm.  QTc is remained stable on her current dose of dofetilide.  We Dennette Faulconer continue with current management.  Robi Dewolfe M. Nila Winker MD 01/26/2022 7:30 AM

## 2022-01-26 NOTE — Plan of Care (Signed)

## 2022-01-26 NOTE — Progress Notes (Signed)
Pt had a 2.81 second pause, asymptomatic resting comfortably. Humphrey Rolls, MD paged. Will continue to monitor.   Elaina Hoops, RN

## 2022-01-27 ENCOUNTER — Other Ambulatory Visit (HOSPITAL_COMMUNITY): Payer: Self-pay

## 2022-01-27 DIAGNOSIS — I480 Paroxysmal atrial fibrillation: Secondary | ICD-10-CM | POA: Diagnosis not present

## 2022-01-27 LAB — BASIC METABOLIC PANEL
Anion gap: 7 (ref 5–15)
BUN: 11 mg/dL (ref 8–23)
CO2: 22 mmol/L (ref 22–32)
Calcium: 9.2 mg/dL (ref 8.9–10.3)
Chloride: 110 mmol/L (ref 98–111)
Creatinine, Ser: 0.86 mg/dL (ref 0.44–1.00)
GFR, Estimated: >60 mL/min (ref >60)
Glucose, Bld: 83 mg/dL (ref 70–99)
Potassium: 4.2 mmol/L (ref 3.5–5.1)
Sodium: 139 mmol/L (ref 135–145)

## 2022-01-27 LAB — MAGNESIUM: Magnesium: 2.1 mg/dL (ref 1.7–2.4)

## 2022-01-27 MED ORDER — DOFETILIDE 250 MCG PO CAPS
250.0000 ug | ORAL_CAPSULE | Freq: Two times a day (BID) | ORAL | 6 refills | Status: DC
  Filled 2022-01-27: qty 60, 30d supply, fill #0

## 2022-01-27 NOTE — Discharge Summary (Addendum)
ELECTROPHYSIOLOGY PROCEDURE DISCHARGE SUMMARY    Patient ID: Charlene Coleman,  MRN: [id number], DOB/AGE: 1941/05/08 81 y.o.  Admit date: 01/24/2022 Discharge date: 01/27/2022  Primary Care Physician: Dion Body, MD  Primary Cardiologist: Kate Sable, MD  Electrophysiologist: Vickie Epley, MD   Primary Discharge Diagnosis:  1.  Persistent atrial fibrillation status post Tikosyn loading this admission  Secondary Discharge Diagnosis:  2. Sinus bradycardia  Allergies  Allergen Reactions   Enalapril Cough   Amiodarone     Thyroid dysfunction   Tylenol [Acetaminophen] Tinitus     Procedures This Admission:  1.  Tikosyn loading  Brief HPI: Charlene Coleman is a 81 y.o. female with a past medical history as noted above.  They were referred to EP in the outpatient setting for treatment options of atrial fibrillation.  Risks, benefits, and alternatives to Tikosyn were reviewed with the patient who wished to proceed.    Hospital Course:  The patient was admitted and Tikosyn was initiated.  Renal function and electrolytes were followed during the hospitalization.  Their QTc remained stable.  Pt presented in sinus bradycardia and did not require cardioversion.  She did have bradycardia resulting in cessation of her beta blocker. They were monitored until discharge on telemetry which demonstrated NSR/sinus brady.  On the day of discharge, they were examined by Dr. Quentin Ore  who considered them stable for discharge to home.  Follow-up has been arranged with the Atrial Fibrillation clinic in approximately 1 week and with  EP APP   in 4 weeks.   Physical Exam: Vitals:   01/26/22 1055 01/26/22 1520 01/26/22 1951 01/27/22 0450  BP: 138/72 (!) 107/92 (!) 153/91 (!) 145/69  Pulse: (!) 56 (!) 52 (!) 51 (!) 46  Resp:  17    Temp:  97.7 F (36.5 C) 97.6 F (36.4 C) 97.8 F (36.6 C)  TempSrc:  Oral Oral Oral  SpO2:  98% 99% 99%  Weight:      Height:        GEN- The  patient is well appearing, alert and oriented x 3 today.   HEENT: normocephalic, atraumatic; sclera clear, conjunctiva pink; hearing intact; oropharynx clear; neck supple, no JVP Lymph- no cervical lymphadenopathy Lungs- Clear to ausculation bilaterally, normal work of breathing.  No wheezes, rales, rhonchi Heart- Regular rate and rhythm, no murmurs, rubs or gallops, PMI not laterally displaced GI- soft, non-tender, non-distended, bowel sounds present, no hepatosplenomegaly Extremities- no clubbing, cyanosis, or edema; DP/PT/radial pulses 2+ bilaterally MS- no significant deformity or atrophy Skin- warm and dry, no rash or lesion Psych- euthymic mood, full affect Neuro- strength and sensation are intact   Labs:   Lab Results  Component Value Date   WBC 7.1 12/30/2021   HGB 12.0 12/30/2021   HCT 38.1 12/30/2021   MCV 96.2 12/30/2021   PLT 240 12/30/2021    Recent Labs  Lab 01/27/22 0503  NA 139  K 4.2  CL 110  CO2 22  BUN 11  CREATININE 0.86  CALCIUM 9.2  GLUCOSE 83     Discharge Medications:  Allergies as of 01/27/2022       Reactions   Enalapril Cough   Amiodarone    Thyroid dysfunction   Tylenol [acetaminophen] Tinitus        Medication List     STOP taking these medications    metoprolol succinate 25 MG 24 hr tablet Commonly known as: TOPROL-XL       TAKE these medications  ADVANCED PROBIOTIC PO Take 1 tablet by mouth every morning.   allopurinol 300 MG tablet Commonly known as: ZYLOPRIM Take 300 mg by mouth daily.   atorvastatin 20 MG tablet Commonly known as: LIPITOR Take 20 mg by mouth every evening.   Calcium Carbonate-Vitamin D 600-400 MG-UNIT tablet Take 1 tablet by mouth 3 (three) times a week.   carboxymethylcellulose 0.5 % Soln Commonly known as: REFRESH PLUS Place 1 drop into both eyes daily as needed (dry eyes).   dofetilide 250 MCG capsule Commonly known as: TIKOSYN Take 1 capsule (250 mcg total) by mouth 2 (two) times  daily.   Eliquis 5 MG Tabs tablet Generic drug: apixaban TAKE 1 TABLET TWICE DAILY   famotidine 20 MG tablet Commonly known as: PEPCID Take 20 mg by mouth at bedtime.   furosemide 20 MG tablet Commonly known as: LASIX Take 1 tablet (20 mg total) by mouth daily. For swelling.   irbesartan 300 MG tablet Commonly known as: Avapro Take 1 tablet (300 mg total) by mouth daily.   levothyroxine 88 MCG tablet Commonly known as: SYNTHROID Take 88 mcg by mouth daily before breakfast.   oxybutynin 5 MG 24 hr tablet Commonly known as: DITROPAN-XL Take 5 mg by mouth in the morning.        Disposition:    Follow-up Information     [facility] Follow up.   Specialty: Cardiology Why: on 7/6 at 230 pm for post hospital tikosyn follow up Contact information: [id number] [place] [address] [phone]                Duration of Discharge Encounter: Greater than 30 minutes including physician time.  Jacalyn Lefevre, PA-C  01/27/2022 10:56 AM

## 2022-01-27 NOTE — Progress Notes (Signed)
Pharmacy: Dofetilide (Tikosyn) - Follow Up Assessment and Electrolyte Replacement  Pharmacy consulted to assist in monitoring and replacing electrolytes in this 81 y.o. female admitted on 01/24/2022 undergoing dofetilide initiation. First dofetilide dose: 6/26  Labs:    Component Value Date/Time   K 4.2 01/27/2022 0503   MG 2.1 01/27/2022 0503     Plan: Potassium: K >/= 4: No additional supplementation needed  Magnesium: Mg > 2: No additional supplementation needed   No potassium recommend on discharge    Thank you for allowing pharmacy to participate in this patient's care   Benetta Spar, PharmD, BCPS, [place] Pharmacist  Please check AMION for all [facility] phone numbers After 10:00 PM, call [place]

## 2022-01-27 NOTE — Progress Notes (Signed)
EKG from yesterday evening 01/26/2022 reviewed    Shows remains in NSR in 50s with stable QTc at ~460 ms.  Continue  Tikosyn 250 mcg BID.   K 4.2, Mg 2.1  Home this afternoon if QT remains stable.     Shirley Friar, PA-C  Pager: [phone]  01/27/2022 7:15 AM

## 2022-01-27 NOTE — Plan of Care (Signed)
  Problem: Education: Goal: Understanding of CV disease, CV risk reduction, and recovery process will improve Outcome: Adequate for Discharge Goal: Individualized Educational Video(s) Outcome: Adequate for Discharge   Problem: Activity: Goal: Ability to return to baseline activity level will improve Outcome: Adequate for Discharge   Problem: Cardiovascular: Goal: Ability to achieve and maintain adequate cardiovascular perfusion will improve Outcome: Adequate for Discharge Goal: Vascular access site(s) Level 0-1 will be maintained Outcome: Adequate for Discharge   Problem: Health Behavior/Discharge Planning: Goal: Ability to safely manage health-related needs after discharge will improve Outcome: Adequate for Discharge   Problem: Education: Goal: Knowledge of General Education information will improve Description: Including pain rating scale, medication(s)/side effects and non-pharmacologic comfort measures Outcome: Adequate for Discharge   Problem: Health Behavior/Discharge Planning: Goal: Ability to manage health-related needs will improve Outcome: Adequate for Discharge   Problem: Clinical Measurements: Goal: Ability to maintain clinical measurements within normal limits will improve Outcome: Adequate for Discharge Goal: Will remain free from infection Outcome: Adequate for Discharge Goal: Diagnostic test results will improve Outcome: Adequate for Discharge Goal: Respiratory complications will improve Outcome: Adequate for Discharge Goal: Cardiovascular complication will be avoided Outcome: Adequate for Discharge   Problem: Activity: Goal: Risk for activity intolerance will decrease Outcome: Adequate for Discharge   Problem: Nutrition: Goal: Adequate nutrition will be maintained Outcome: Adequate for Discharge   Problem: Coping: Goal: Level of anxiety will decrease Outcome: Adequate for Discharge   Problem: Elimination: Goal: Will not experience complications  related to bowel motility Outcome: Adequate for Discharge Goal: Will not experience complications related to urinary retention Outcome: Adequate for Discharge   Problem: Pain Managment: Goal: General experience of comfort will improve Outcome: Adequate for Discharge   Problem: Safety: Goal: Ability to remain free from injury will improve Outcome: Adequate for Discharge   Problem: Skin Integrity: Goal: Risk for impaired skin integrity will decrease Outcome: Adequate for Discharge   Problem: Education: Goal: Knowledge of disease or condition will improve Outcome: Adequate for Discharge Goal: Understanding of medication regimen will improve Outcome: Adequate for Discharge Goal: Individualized Educational Video(s) Outcome: Adequate for Discharge   Problem: Activity: Goal: Ability to tolerate increased activity will improve Outcome: Adequate for Discharge   Problem: Cardiac: Goal: Ability to achieve and maintain adequate cardiopulmonary perfusion will improve Outcome: Adequate for Discharge   Problem: Health Behavior/Discharge Planning: Goal: Ability to safely manage health-related needs after discharge will improve Outcome: Adequate for Discharge

## 2022-01-27 NOTE — Care Management Important Message (Signed)
Important Message  Patient Details  Name: Charlene Coleman MRN: [id number] Date of Birth: [birth year]   Medicare Important Message Given:  Yes     Shelda Altes 01/27/2022, 8:32 AM

## 2022-01-27 NOTE — Progress Notes (Signed)
Mobility Specialist Progress Note    01/27/22 1111  Mobility  Activity Ambulated independently in hallway  Level of Assistance Independent  Assistive Device None  Distance Ambulated (ft) 300 ft  Activity Response Tolerated well  $Mobility charge 1 Mobility   Pre-Mobility: 51 HR, 151/73 BP, 98% SpO2  Pt received in chair and agreeable. No complaints on walk. Returned to sitting EOB with RN present.  Hildred Alamin Mobility Specialist

## 2022-01-31 ENCOUNTER — Other Ambulatory Visit (HOSPITAL_COMMUNITY): Payer: Self-pay

## 2022-02-03 ENCOUNTER — Other Ambulatory Visit: Payer: Self-pay

## 2022-02-03 ENCOUNTER — Other Ambulatory Visit (HOSPITAL_COMMUNITY): Payer: Self-pay | Admitting: *Deleted

## 2022-02-03 ENCOUNTER — Ambulatory Visit (HOSPITAL_COMMUNITY)
Admit: 2022-02-03 | Discharge: 2022-02-03 | Disposition: A | Discharge status: Home / Self Care | Payer: Medicare HMO | Attending: Physician Assistant | Admitting: Physician Assistant

## 2022-02-03 ENCOUNTER — Encounter (HOSPITAL_COMMUNITY): Payer: Self-pay | Admitting: Physician Assistant

## 2022-02-03 VITALS — BP 134/74 | HR 54 | Ht 60.0 in | Wt 149.4 lb

## 2022-02-03 DIAGNOSIS — E785 Hyperlipidemia, unspecified: Secondary | ICD-10-CM | POA: Insufficient documentation

## 2022-02-03 DIAGNOSIS — I2510 Atherosclerotic heart disease of native coronary artery without angina pectoris: Secondary | ICD-10-CM | POA: Diagnosis not present

## 2022-02-03 DIAGNOSIS — I5032 Chronic diastolic (congestive) heart failure: Secondary | ICD-10-CM | POA: Diagnosis not present

## 2022-02-03 DIAGNOSIS — Z7901 Long term (current) use of anticoagulants: Secondary | ICD-10-CM | POA: Diagnosis not present

## 2022-02-03 DIAGNOSIS — D6869 Other thrombophilia: Secondary | ICD-10-CM | POA: Diagnosis not present

## 2022-02-03 DIAGNOSIS — I110 Hypertensive heart disease with heart failure: Secondary | ICD-10-CM | POA: Diagnosis not present

## 2022-02-03 DIAGNOSIS — I480 Paroxysmal atrial fibrillation: Secondary | ICD-10-CM | POA: Insufficient documentation

## 2022-02-03 LAB — MAGNESIUM: Magnesium: 1.9 mg/dL (ref 1.7–2.4)

## 2022-02-03 LAB — BASIC METABOLIC PANEL
Anion gap: 11 (ref 5–15)
BUN: 13 mg/dL (ref 8–23)
CO2: 24 mmol/L (ref 22–32)
Calcium: 9.3 mg/dL (ref 8.9–10.3)
Chloride: 104 mmol/L (ref 98–111)
Creatinine, Ser: 1.24 mg/dL — ABNORMAL HIGH (ref 0.44–1.00)
GFR, Estimated: 44 mL/min — ABNORMAL LOW (ref >60)
Glucose, Bld: 98 mg/dL (ref 70–99)
Potassium: 3.4 mmol/L — ABNORMAL LOW (ref 3.5–5.1)
Sodium: 139 mmol/L (ref 135–145)

## 2022-02-03 MED ORDER — DOFETILIDE 250 MCG PO CAPS: 250.0000 ug | ORAL_CAPSULE | Freq: Two times a day (BID) | ORAL | 1 refills | Status: DC

## 2022-02-03 MED ORDER — POTASSIUM CHLORIDE CRYS ER 20 MEQ PO TBCR: 20.0000 meq | EXTENDED_RELEASE_TABLET | Freq: Every day | ORAL | 3 refills | Status: DC

## 2022-02-03 NOTE — Progress Notes (Signed)
Primary Care Physician: Dion Body, MD Primary Cardiologist: Dr Garen Lah Primary Electrophysiologist: Dr Quentin Ore Referring Physician: [name] is a 81 y.o. female with a history of HTN, HLD, CAD, HFpEF, atrial fibrillation who presents for follow up in the [facility]. Patient is on Eliquis for a CHADS2VASC score of 6. She had been maintained on amiodarone but this was discontinued due to thyroid dysfunction. She underwent afib ablation with Dr Quentin Ore on 09/21/21. She unfortunately continued to have symptomatic afib and was seen by Dr Quentin Ore on 12/22/21 who recommended dofetilide. She was also having CP with her afib and had LHC on 12/30/21 which showed nonobstructive CAD.  On follow up today, patient is s/p dofetilide loading 6/26-6/29/23. She reports that she has done well from an afib standpoint. She had one episode lasting < 5 minutes which "felt like it was trying to go into afib but couldn't". She does have an URI currently. No bleeding issues on anticoagulation.   Today, she denies symptoms of palpitations, shortness of breath, orthopnea, PND, lower extremity edema, dizziness, presyncope, syncope, snoring, daytime somnolence, bleeding, or neurologic sequela. The patient is tolerating medications without difficulties and is otherwise without complaint today.    Atrial Fibrillation Risk Factors:  she does not have symptoms or diagnosis of sleep apnea. she does not have a history of rheumatic fever.   she has a BMI of Body mass index is 29.18 kg/m.Marland Kitchen Filed Weights   02/03/22 1452  Weight: 67.8 kg     Family History  Problem Relation Age of Onset   Breast cancer Cousin    Breast cancer Maternal Aunt 55     Atrial Fibrillation Management history:  Previous antiarrhythmic drugs: amiodarone, dofetilide  Previous cardioversions: none Previous ablations: 09/21/21 CHADS2VASC score: 6 Anticoagulation history: Eliquis     Past Medical History:  Diagnosis Date   (HFpEF) heart failure with preserved ejection fraction ([place])    a. 06/2021 Echo: EF 60-65%, no rwma, mild asymm LVH of basal-septal segment. Nl RV fxn. RVSP 23.1mHg. Mild MR. AoV sclerosis w/o stenosis.   Aortic valve sclerosis    a. 06/2021 Echo: Ao sclerosis w/o stenosis.   Borderline diabetes    [place]   Left side - radiation - lumpectomy   CAD (coronary artery disease)    a. 12/2021 Cath: LM nl, LAD 25p/m, D1 25, RI nl, LCX nl, RCA 20/30p, 544mMed rx.   DCIS (ductal carcinoma in situ)    Dyspnea    Dysrhythmia    Empty sella syndrome (HCC)    GERD (gastroesophageal reflux disease)    Gout    Hyperlipidemia    Hypertension    PAF (paroxysmal atrial fibrillation) ([facility]   a. Amio stopped 2/2 thyroid dysfunction; b. 09/2021 s/p PVI.   Personal history of radiation therapy    Skin cancer 2011   Past Surgical History:  Procedure Laterality Date   ABDOMINAL HYSTERECTOMY     ATRIAL FIBRILLATION ABLATION N/A 09/21/2021   Procedure: ATRIAL FIBRILLATION ABLATION;  Surgeon: LaVickie EpleyMD;  [facility];  Service: Cardiovascular;  Laterality: N/A;   BREAST BIOPSY Left 2015   positive   BREAST LUMPECTOMY Left 09/23/2013   CATARACT EXTRACTION Bilateral    CHOLECYSTECTOMY     ELECTROPHYSIOLOGIC STUDY     GASTRIC FUNDOPLICATION     JOINT REPLACEMENT Right 09/22/2020   R hip replacement   LEFT HEART CATH AND CORONARY ANGIOGRAPHY N/A  12/30/2021   Procedure: LEFT HEART CATH AND CORONARY ANGIOGRAPHY;  Surgeon: Nelva Bush, MD;  Location: [facility];  Service: Cardiovascular;  Laterality: N/A;   left lumpectomy      Current Outpatient Medications  Medication Sig Dispense Refill   allopurinol (ZYLOPRIM) 300 MG tablet Take 300 mg by mouth daily.      apixaban (ELIQUIS) 5 MG TABS tablet TAKE 1 TABLET TWICE DAILY 180 tablet 1   atorvastatin (LIPITOR) 20 MG tablet Take 20 mg by mouth every  evening.     Calcium Carbonate-Vitamin D 600-400 MG-UNIT per tablet Take 1 tablet by mouth 3 (three) times a week.      carboxymethylcellulose (REFRESH PLUS) 0.5 % SOLN Place 1 drop into both eyes daily as needed (dry eyes).     dofetilide (TIKOSYN) 250 MCG capsule Take 1 capsule (250 mcg total) by mouth 2 (two) times daily. 180 capsule 1   famotidine (PEPCID) 20 MG tablet Take 20 mg by mouth at bedtime.     furosemide (LASIX) 20 MG tablet Take 1 tablet (20 mg total) by mouth daily. For swelling.     irbesartan (AVAPRO) 300 MG tablet Take 1 tablet (300 mg total) by mouth daily. 90 tablet 3   levothyroxine (SYNTHROID) 88 MCG tablet Take 88 mcg by mouth daily before breakfast.     oxybutynin (DITROPAN-XL) 5 MG 24 hr tablet Take 5 mg by mouth in the morning.     Probiotic Product (ADVANCED PROBIOTIC PO) Take 1 tablet by mouth every morning.     No current facility-administered medications for this encounter.    Allergies  Allergen Reactions   Enalapril Cough   Amiodarone     Thyroid dysfunction   Tylenol [Acetaminophen] Tinitus    Social History   Socioeconomic History   Marital status: Married    Spouse name: Not on file   Number of children: Not on file   Years of education: Not on file   Highest education level: Not on file  Occupational History   Not on file  Tobacco Use   Smoking status: Never   Smokeless tobacco: Never   Tobacco comments:    Never smoke 10/19/21  Vaping Use   Vaping Use: Never used  Substance and Sexual Activity   Alcohol use: No   Drug use: No   Sexual activity: Not on file  Other Topics Concern   Not on file  Social History Narrative   Not on file   Social Determinants of Health   Financial Resource Strain: Not on file  Food Insecurity: Not on file  Transportation Needs: Not on file  Physical Activity: Not on file  Stress: Not on file  Social Connections: Not on file  Intimate Partner Violence: Not on file     ROS- All systems are  reviewed and negative except as per the HPI above.  Physical Exam: Vitals:   02/03/22 1452  BP: 134/74  Pulse: (!) 54  Weight: 67.8 kg  Height: 5' (1.524 m)     GEN- The patient is a well appearing elderly female, alert and oriented x 3 today.   HEENT-head normocephalic, atraumatic, sclera clear, conjunctiva pink, hearing intact, trachea midline. Lungs- Clear to ausculation bilaterally, normal work of breathing Heart- Regular rate and rhythm, bradycardia, no murmurs, rubs or gallops  GI- soft, NT, ND, + BS Extremities- no clubbing, cyanosis, or edema MS- no significant deformity or atrophy Skin- no rash or lesion Psych- euthymic mood, full affect Neuro- strength  and sensation are intact   Wt Readings from Last 3 Encounters:  02/03/22 67.8 kg  01/24/22 68.2 kg  01/24/22 68.4 kg    EKG today demonstrates  SB Vent. rate 54 BPM PR interval 184 ms QRS duration 82 ms QT/QTcB 470/445 ms  Echo 06/29/21 demonstrated   1. Left ventricular ejection fraction, by estimation, is 60 to 65%. The  left ventricle has normal function. The left ventricle has no regional  wall motion abnormalities. There is mild asymmetric left ventricular  hypertrophy of the basal-septal segment. Left ventricular diastolic parameters were normal.   2. Right ventricular systolic function is normal. The right ventricular  size is normal. There is normal pulmonary artery systolic pressure. The  estimated right ventricular systolic pressure is 66.0 mmHg.   3. LA index 26 ml/m2 - normal (however diameter 4.3 cm on parasternal long).   4. The mitral valve is normal in structure. Mild mitral valve  regurgitation. No evidence of mitral stenosis.   5. The aortic valve is normal in structure. There is moderate  calcification of the aortic valve. There is mild thickening of the aortic  valve. Aortic valve regurgitation is not visualized. Aortic valve  sclerosis is present, with no evidence of aortic  valve  stenosis.   6. The inferior vena cava is normal in size with greater than 50%  respiratory variability, suggesting right atrial pressure of 3 mmHg.   Epic records are reviewed at length today  CHA2DS2-VASc Score = 6  The patient's score is based upon: CHF History: 1 HTN History: 1 Diabetes History: 0 Stroke History: 0 Vascular Disease History: 1 Age Score: 2 Gender Score: 1       ASSESSMENT AND PLAN: 1. Paroxysmal Atrial Fibrillation (ICD10:  I48.0) The patient's CHA2DS2-VASc score is 6, indicating a 9.7% annual risk of stroke.   S/p afib ablation 09/21/21 S/p dofetilide admission 6/26-6/29/23 Patient in [place].  Continue dofetilide 250 mcg BID. QT stable. Check bmet/mag today Continue Eliquis 5 mg BID  2. Secondary Hypercoagulable State (ICD10:  D68.69) The patient is at significant risk for stroke/thromboembolism based upon her CHA2DS2-VASc Score of 6.  Continue Apixaban (Eliquis).   3. CAD  Non obstructive No anginal symptoms.  4. HTN Stable, no changes today.  5. Chronic HFpEF Appears euvolemic today.   Follow up in the AF clinic in one month and Dr Quentin Ore in 4 months.    [facility] [address] [place], [place] [address] [phone] 02/03/2022 2:53 PM

## 2022-02-09 ENCOUNTER — Other Ambulatory Visit: Payer: Self-pay | Admitting: *Deleted

## 2022-02-09 DIAGNOSIS — I480 Paroxysmal atrial fibrillation: Secondary | ICD-10-CM

## 2022-02-09 MED ORDER — APIXABAN 5 MG PO TABS: 5.0000 mg | ORAL_TABLET | Freq: Two times a day (BID) | ORAL | 1 refills | Status: DC

## 2022-02-09 NOTE — Telephone Encounter (Signed)
Eliquis '5mg'$  refill request received. Patient is 81 years old, weight-67.8kg, Crea-1.24 on 02/03/2022, Diagnosis-Afib, and last seen by Adline Peals on 02/03/2022. Dose is appropriate based on dosing criteria. Will send in refill to requested pharmacy.

## 2022-03-02 ENCOUNTER — Other Ambulatory Visit: Payer: Self-pay

## 2022-03-02 MED ORDER — IRBESARTAN 300 MG PO TABS: 300.0000 mg | ORAL_TABLET | Freq: Every day | ORAL | 0 refills | Status: DC

## 2022-03-09 ENCOUNTER — Ambulatory Visit: Payer: Medicare HMO | Admitting: Physician Assistant

## 2022-03-09 ENCOUNTER — Encounter (HOSPITAL_COMMUNITY): Payer: Self-pay | Admitting: Physician Assistant

## 2022-03-09 ENCOUNTER — Ambulatory Visit (HOSPITAL_COMMUNITY)
Admission: RE | Admit: 2022-03-09 | Discharge: 2022-03-09 | Disposition: A | Discharge status: Home / Self Care | Payer: Medicare HMO | Source: Ambulatory Visit | Attending: Physician Assistant | Admitting: Physician Assistant

## 2022-03-09 VITALS — BP 128/72 | HR 53 | Ht 60.0 in | Wt 147.0 lb

## 2022-03-09 DIAGNOSIS — I480 Paroxysmal atrial fibrillation: Secondary | ICD-10-CM

## 2022-03-09 DIAGNOSIS — D6869 Other thrombophilia: Secondary | ICD-10-CM | POA: Insufficient documentation

## 2022-03-09 DIAGNOSIS — I2510 Atherosclerotic heart disease of native coronary artery without angina pectoris: Secondary | ICD-10-CM | POA: Diagnosis not present

## 2022-03-09 DIAGNOSIS — I5032 Chronic diastolic (congestive) heart failure: Secondary | ICD-10-CM | POA: Insufficient documentation

## 2022-03-09 DIAGNOSIS — Z7901 Long term (current) use of anticoagulants: Secondary | ICD-10-CM | POA: Diagnosis not present

## 2022-03-09 DIAGNOSIS — I110 Hypertensive heart disease with heart failure: Secondary | ICD-10-CM | POA: Diagnosis not present

## 2022-03-09 DIAGNOSIS — Z79899 Other long term (current) drug therapy: Secondary | ICD-10-CM | POA: Diagnosis not present

## 2022-03-09 DIAGNOSIS — E785 Hyperlipidemia, unspecified: Secondary | ICD-10-CM | POA: Diagnosis not present

## 2022-03-09 LAB — BASIC METABOLIC PANEL WITH GFR
Anion gap: 9 (ref 5–15)
BUN: 12 mg/dL (ref 8–23)
CO2: 23 mmol/L (ref 22–32)
Calcium: 9.2 mg/dL (ref 8.9–10.3)
Chloride: 107 mmol/L (ref 98–111)
Creatinine, Ser: 0.9 mg/dL (ref 0.44–1.00)
GFR, Estimated: >60 mL/min
Glucose, Bld: 91 mg/dL (ref 70–99)
Potassium: 3.9 mmol/L (ref 3.5–5.1)
Sodium: 139 mmol/L (ref 135–145)

## 2022-03-09 LAB — MAGNESIUM: Magnesium: 2 mg/dL (ref 1.7–2.4)

## 2022-03-09 NOTE — Progress Notes (Signed)
Primary Care Physician: Dion Body, MD Primary Cardiologist: Dr Garen Lah Primary Electrophysiologist: Dr Quentin Ore Referring Physician: [name] is a 81 y.o. female with a history of HTN, HLD, CAD, HFpEF, atrial fibrillation who presents for follow up in the [facility]. Patient is on Eliquis for a CHADS2VASC score of 6. She had been maintained on amiodarone but this was discontinued due to thyroid dysfunction. She underwent afib ablation with Dr Quentin Ore on 09/21/21. She unfortunately continued to have symptomatic afib and was seen by Dr Quentin Ore on 12/22/21 who recommended dofetilide. She was also having CP with her afib and had LHC on 12/30/21 which showed nonobstructive CAD. Patient is s/p dofetilide loading 6/26-6/29/23.   On follow up today, patient reports that she has done well since her last visit. She has not had any afib episodes. She states her energy level is much better. No bleeding issues on anticoagulation.   Today, she denies symptoms of palpitations, shortness of breath, orthopnea, PND, lower extremity edema, dizziness, presyncope, syncope, snoring, daytime somnolence, bleeding, or neurologic sequela. The patient is tolerating medications without difficulties and is otherwise without complaint today.    Atrial Fibrillation Risk Factors:  she does not have symptoms or diagnosis of sleep apnea. she does not have a history of rheumatic fever.   she has a BMI of Body mass index is 28.71 kg/m.Marland Kitchen Filed Weights   03/09/22 1350  Weight: 66.7 kg    Family History  Problem Relation Age of Onset   Breast cancer Cousin    Breast cancer Maternal Aunt 55    Atrial Fibrillation Management history:  Previous antiarrhythmic drugs: amiodarone, dofetilide  Previous cardioversions: none Previous ablations: 09/21/21 CHADS2VASC score: 6 Anticoagulation history: Eliquis    Past Medical History:  Diagnosis Date   (HFpEF) heart  failure with preserved ejection fraction ([place])    a. 06/2021 Echo: EF 60-65%, no rwma, mild asymm LVH of basal-septal segment. Nl RV fxn. RVSP 23.63mHg. Mild MR. AoV sclerosis w/o stenosis.   Aortic valve sclerosis    a. 06/2021 Echo: Ao sclerosis w/o stenosis.   Borderline diabetes    [place]   Left side - radiation - lumpectomy   CAD (coronary artery disease)    a. 12/2021 Cath: LM nl, LAD 25p/m, D1 25, RI nl, LCX nl, RCA 20/30p, 57mMed rx.   DCIS (ductal carcinoma in situ)    Dyspnea    Dysrhythmia    Empty sella syndrome (HCC)    GERD (gastroesophageal reflux disease)    Gout    Hyperlipidemia    Hypertension    PAF (paroxysmal atrial fibrillation) ([facility]   a. Amio stopped 2/2 thyroid dysfunction; b. 09/2021 s/p PVI.   Personal history of radiation therapy    Skin cancer 2011   Past Surgical History:  Procedure Laterality Date   ABDOMINAL HYSTERECTOMY     ATRIAL FIBRILLATION ABLATION N/A 09/21/2021   Procedure: ATRIAL FIBRILLATION ABLATION;  Surgeon: LaVickie EpleyMD;  [facility];  Service: Cardiovascular;  Laterality: N/A;   BREAST BIOPSY Left 2015   positive   BREAST LUMPECTOMY Left 09/23/2013   CATARACT EXTRACTION Bilateral    CHOLECYSTECTOMY     ELECTROPHYSIOLOGIC STUDY     GASTRIC FUNDOPLICATION     JOINT REPLACEMENT Right 09/22/2020   R hip replacement   LEFT HEART CATH AND CORONARY ANGIOGRAPHY N/A 12/30/2021   Procedure: LEFT HEART CATH AND CORONARY ANGIOGRAPHY;  Surgeon:  End, Harrell Gave, MD;  Location: [facility];  Service: Cardiovascular;  Laterality: N/A;   left lumpectomy      Current Outpatient Medications  Medication Sig Dispense Refill   allopurinol (ZYLOPRIM) 300 MG tablet Take 300 mg by mouth daily.      apixaban (ELIQUIS) 5 MG TABS tablet Take 1 tablet (5 mg total) by mouth 2 (two) times daily. 180 tablet 1   atorvastatin (LIPITOR) 20 MG tablet Take 20 mg by mouth every evening.     Calcium  Carbonate-Vitamin D 600-400 MG-UNIT per tablet Take 1 tablet by mouth 3 (three) times a week.      carboxymethylcellulose (REFRESH PLUS) 0.5 % SOLN Place 1 drop into both eyes daily as needed (dry eyes).     dofetilide (TIKOSYN) 250 MCG capsule Take 1 capsule (250 mcg total) by mouth 2 (two) times daily. 180 capsule 1   famotidine (PEPCID) 20 MG tablet Take 20 mg by mouth at bedtime.     furosemide (LASIX) 20 MG tablet Take 1 tablet (20 mg total) by mouth daily. For swelling.     irbesartan (AVAPRO) 300 MG tablet Take 1 tablet (300 mg total) by mouth daily. 90 tablet 0   levothyroxine (SYNTHROID) 88 MCG tablet Take 88 mcg by mouth daily before breakfast.     oxybutynin (DITROPAN-XL) 5 MG 24 hr tablet Take 5 mg by mouth in the morning.     potassium chloride SA (KLOR-CON M) 20 MEQ tablet Take 1 tablet (20 mEq total) by mouth daily. 30 tablet 3   Probiotic Product (ADVANCED PROBIOTIC PO) Take 1 tablet by mouth every morning.     No current facility-administered medications for this encounter.    Allergies  Allergen Reactions   Enalapril Cough   Amiodarone     Thyroid dysfunction   Tylenol [Acetaminophen] Tinitus    Social History   Socioeconomic History   Marital status: Married    Spouse name: Not on file   Number of children: Not on file   Years of education: Not on file   Highest education level: Not on file  Occupational History   Not on file  Tobacco Use   Smoking status: Never   Smokeless tobacco: Never   Tobacco comments:    Never smoke 10/19/21  Vaping Use   Vaping Use: Never used  Substance and Sexual Activity   Alcohol use: No   Drug use: No   Sexual activity: Not on file  Other Topics Concern   Not on file  Social History Narrative   Not on file   Social Determinants of Health   Financial Resource Strain: Not on file  Food Insecurity: Not on file  Transportation Needs: Not on file  Physical Activity: Not on file  Stress: Not on file  Social Connections:  Not on file  Intimate Partner Violence: Not on file     ROS- All systems are reviewed and negative except as per the HPI above.  Physical Exam: Vitals:   03/09/22 1350  BP: 128/72  Pulse: (!) 53  Weight: 66.7 kg  Height: 5' (1.524 m)     GEN- The patient is a well appearing elderly female, alert and oriented x 3 today.   HEENT-head normocephalic, atraumatic, sclera clear, conjunctiva pink, hearing intact, trachea midline. Lungs- Clear to ausculation bilaterally, normal work of breathing Heart- Regular rate and rhythm, no murmurs, rubs or gallops  GI- soft, NT, ND, + BS Extremities- no clubbing, cyanosis, or edema MS- no  significant deformity or atrophy Skin- no rash or lesion Psych- euthymic mood, full affect Neuro- strength and sensation are intact   Wt Readings from Last 3 Encounters:  03/09/22 66.7 kg  02/03/22 67.8 kg  01/24/22 68.2 kg    EKG today demonstrates  SB Vent. rate 53 BPM PR interval 160 ms QRS duration 76 ms QT/QTcB 490/459 ms  Echo 06/29/21 demonstrated   1. Left ventricular ejection fraction, by estimation, is 60 to 65%. The  left ventricle has normal function. The left ventricle has no regional  wall motion abnormalities. There is mild asymmetric left ventricular  hypertrophy of the basal-septal segment. Left ventricular diastolic parameters were normal.   2. Right ventricular systolic function is normal. The right ventricular  size is normal. There is normal pulmonary artery systolic pressure. The  estimated right ventricular systolic pressure is 78.6 mmHg.   3. LA index 26 ml/m2 - normal (however diameter 4.3 cm on parasternal long).   4. The mitral valve is normal in structure. Mild mitral valve  regurgitation. No evidence of mitral stenosis.   5. The aortic valve is normal in structure. There is moderate  calcification of the aortic valve. There is mild thickening of the aortic  valve. Aortic valve regurgitation is not visualized. Aortic  valve  sclerosis is present, with no evidence of aortic  valve stenosis.   6. The inferior vena cava is normal in size with greater than 50%  respiratory variability, suggesting right atrial pressure of 3 mmHg.   Epic records are reviewed at length today  CHA2DS2-VASc Score = 6  The patient's score is based upon: CHF History: 1 HTN History: 1 Diabetes History: 0 Stroke History: 0 Vascular Disease History: 1 Age Score: 2 Gender Score: 1       ASSESSMENT AND PLAN: 1. Paroxysmal Atrial Fibrillation (ICD10:  I48.0) The patient's CHA2DS2-VASc score is 6, indicating a 9.7% annual risk of stroke.   S/p afib ablation 09/21/21 S/p dofetilide admission 6/26-6/29/23 Patient appears to be maintaining SR. Continue dofetilide 250 mcg BID. QT stable. Check bmet/mag today. Continue Eliquis 5 mg BID  2. Secondary Hypercoagulable State (ICD10:  D68.69) The patient is at significant risk for stroke/thromboembolism based upon her CHA2DS2-VASc Score of 6.  Continue Apixaban (Eliquis).   3. CAD  Non obstructive No anginal symptoms.  4. HTN Stable, no changes today.  5. Chronic HFpEF Fluid status appears stable today.   Follow up with Dr Quentin Ore as scheduled. AF clinic in 6 months.    [facility] [address] [place], [place] [address] [phone] 03/09/2022 2:06 PM

## 2022-03-14 ENCOUNTER — Other Ambulatory Visit (HOSPITAL_COMMUNITY): Payer: Self-pay

## 2022-03-15 DIAGNOSIS — N289 Disorder of kidney and ureter, unspecified: Secondary | ICD-10-CM | POA: Diagnosis not present

## 2022-03-17 ENCOUNTER — Other Ambulatory Visit: Payer: Self-pay | Admitting: *Deleted

## 2022-03-17 MED ORDER — FUROSEMIDE 20 MG PO TABS: 20.0000 mg | ORAL_TABLET | Freq: Every day | ORAL | 2 refills | Status: DC

## 2022-03-22 DIAGNOSIS — E782 Mixed hyperlipidemia: Secondary | ICD-10-CM | POA: Diagnosis not present

## 2022-03-22 DIAGNOSIS — G8929 Other chronic pain: Secondary | ICD-10-CM | POA: Diagnosis not present

## 2022-03-22 DIAGNOSIS — I10 Essential (primary) hypertension: Secondary | ICD-10-CM | POA: Diagnosis not present

## 2022-03-22 DIAGNOSIS — M25511 Pain in right shoulder: Secondary | ICD-10-CM | POA: Diagnosis not present

## 2022-03-22 DIAGNOSIS — Z8739 Personal history of other diseases of the musculoskeletal system and connective tissue: Secondary | ICD-10-CM | POA: Diagnosis not present

## 2022-04-25 ENCOUNTER — Encounter: Payer: Self-pay | Admitting: Cardiology

## 2022-04-25 ENCOUNTER — Ambulatory Visit: Payer: Medicare HMO | Attending: Cardiology | Admitting: Cardiology

## 2022-04-25 VITALS — BP 136/68 | HR 53 | Ht 60.0 in | Wt 154.2 lb

## 2022-04-25 DIAGNOSIS — I480 Paroxysmal atrial fibrillation: Secondary | ICD-10-CM

## 2022-04-25 DIAGNOSIS — E039 Hypothyroidism, unspecified: Secondary | ICD-10-CM | POA: Diagnosis not present

## 2022-04-25 DIAGNOSIS — I2510 Atherosclerotic heart disease of native coronary artery without angina pectoris: Secondary | ICD-10-CM

## 2022-04-25 DIAGNOSIS — I5032 Chronic diastolic (congestive) heart failure: Secondary | ICD-10-CM | POA: Diagnosis not present

## 2022-04-25 DIAGNOSIS — I10 Essential (primary) hypertension: Secondary | ICD-10-CM

## 2022-04-25 NOTE — Patient Instructions (Signed)
Medication Instructions:  Your physician recommends that you continue on your current medications as directed. Please refer to the Current Medication list given to you today.  *If you need a refill on your cardiac medications before your next appointment, please call your pharmacy*   Lab Work: None ordered If you have labs (blood work) drawn today and your tests are completely normal, you will receive your results only by: [place] (if you have MyChart) OR A paper copy in the mail If you have any lab test that is abnormal or we need to change your treatment, we will call you to review the results.   Testing/Procedures: None ordered   Follow-Up: At [facility], you and your health needs are our priority.  As part of our continuing mission to provide you with exceptional heart care, we have created designated Provider Care Teams.  These Care Teams include your primary Cardiologist (physician) and Advanced Practice Providers (APPs -  Physician Assistants and Nurse Practitioners) who all work together to provide you with the care you need, when you need it.  We recommend signing up for the patient portal called "MyChart".  Sign up information is provided on this After Visit Summary.  MyChart is used to connect with patients for Virtual Visits (Telemedicine).  Patients are able to view lab/test results, encounter notes, upcoming appointments, etc.  Non-urgent messages can be sent to your provider as well.   To learn more about what you can do with MyChart, go to [email or web address].    Your next appointment:   Your physician wants you to follow-up in: 1 year You will receive a reminder letter in the mail two months in advance. If you don't receive a letter, please call our office to schedule the follow-up appointment.   The format for your next appointment:   In Person  Provider:   You may see Kate Sable, MD or one of the following Advanced Practice Providers on  your designated Care Team:   Murray Hodgkins, NP Christell Faith, PA-C Cadence Kathlen Mody, PA-C Gerrie Nordmann, NP   Other Instructions N/A  Important Information About Sugar

## 2022-04-25 NOTE — Progress Notes (Signed)
Cardiology Office Note:    Date:  04/25/2022   ID:  Charlene Coleman, DOB May 07, 1941, MRN [id number]  PCP:  Dion Body, MD  [facility] Cardiologist:  Kate Sable, MD  [facility] Electrophysiologist:  Vickie Epley, MD   Referring MD: Dion Body, MD   Chief Complaint  Patient presents with   Follow-up    6 month follow up, No new Cardiac concerns, has only had one Afib episode     History of Present Illness:    Charlene Coleman is a 81 y.o. female with a hx of paroxysmal atrial fibrillation s/p RFA 09/2018 20, hypertension, hyperlipidemia, non obstructive CAD (LHC 50% in D1, RCA 09/2007), HFpEF who presents for follow-up.   Being seen for paroxysmal symptomatic atrial fibrillation, last seen by electrophysiology, started on Tikosyn.  Has been tolerating Tikosyn without any QT issues.  States having a brief episode of palpitations 2 weeks ago, lasting a couple of minutes.  Overall feels well, denies any bleeding issues with Eliquis.  Takes Lasix due to HFpEF and leg edema.   Prior notes Upon chart review, patient had a Lexiscan Myoview performed on 09/30/2019 with mild lateral wall ischemia.   Cardiac monitor in the past revealed atrial fibrillation with 12.68% burden.  [place] 2009 showed moderate stenosis 50% in D1, RCA 09/2007 .   Echo 09/7167 normal systolic function, grade 2 diastolic dysfunction, moderate pulmonary hypertension.  Past Medical History:  Diagnosis Date   (HFpEF) heart failure with preserved ejection fraction ([place])    a. 06/2021 Echo: EF 60-65%, no rwma, mild asymm LVH of basal-septal segment. Nl RV fxn. RVSP 23.71mHg. Mild MR. AoV sclerosis w/o stenosis.   Aortic valve sclerosis    a. 06/2021 Echo: Ao sclerosis w/o stenosis.   Borderline diabetes    [place]   Left side - radiation - lumpectomy   CAD (coronary artery disease)    a. 12/2021 Cath: LM nl, LAD 25p/m, D1 25, RI nl, LCX nl, RCA 20/30p, 562mMed rx.   DCIS  (ductal carcinoma in situ)    Dyspnea    Dysrhythmia    Empty sella syndrome (HCC)    GERD (gastroesophageal reflux disease)    Gout    Hyperlipidemia    Hypertension    [place]   a. Amio stopped 2/2 thyroid dysfunction; b. 09/2021 s/p PVI.   Personal history of radiation therapy    Skin cancer 2011    Past Surgical History:  Procedure Laterality Date   ABDOMINAL HYSTERECTOMY     ATRIAL FIBRILLATION ABLATION N/A 09/21/2021   Procedure: ATRIAL FIBRILLATION ABLATION;  Surgeon: LaVickie EpleyMD;  [facility];  Service: Cardiovascular;  Laterality: N/A;   BREAST BIOPSY Left 2015   positive   BREAST LUMPECTOMY Left 09/23/2013   CATARACT EXTRACTION Bilateral    CHOLECYSTECTOMY     ELECTROPHYSIOLOGIC STUDY     GASTRIC FUNDOPLICATION     JOINT REPLACEMENT Right 09/22/2020   R hip replacement   LEFT HEART CATH AND CORONARY ANGIOGRAPHY N/A 12/30/2021   Procedure: LEFT HEART CATH AND CORONARY ANGIOGRAPHY;  Surgeon: EnNelva BushMD;  [facility];  Service: Cardiovascular;  Laterality: N/A;   left lumpectomy      Current Medications: Current Meds  Medication Sig   allopurinol (ZYLOPRIM) 300 MG tablet Take 300 mg by mouth daily.    apixaban (ELIQUIS) 5 MG TABS tablet Take 1 tablet (5 mg total) by  mouth 2 (two) times daily.   atorvastatin (LIPITOR) 20 MG tablet Take 20 mg by mouth every evening.   Calcium Carbonate-Vitamin D 600-400 MG-UNIT per tablet Take 1 tablet by mouth 3 (three) times a week.    carboxymethylcellulose (REFRESH PLUS) 0.5 % SOLN Place 1 drop into both eyes daily as needed (dry eyes).   dofetilide (TIKOSYN) 250 MCG capsule Take 1 capsule (250 mcg total) by mouth 2 (two) times daily.   famotidine (PEPCID) 20 MG tablet Take 20 mg by mouth at bedtime.   furosemide (LASIX) 20 MG tablet Take 1 tablet (20 mg total) by mouth daily. For swelling.   gabapentin (NEURONTIN) 300 MG capsule Take 300  mg by mouth 2 (two) times daily at 10 AM and 5 PM.   irbesartan (AVAPRO) 300 MG tablet Take 1 tablet (300 mg total) by mouth daily.   levothyroxine (SYNTHROID) 88 MCG tablet Take 88 mcg by mouth daily before breakfast. .'44mg'$  half pill since as of 03-2022   oxybutynin (DITROPAN-XL) 5 MG 24 hr tablet Take 5 mg by mouth in the morning.   potassium chloride SA (KLOR-CON M) 20 MEQ tablet Take 1 tablet (20 mEq total) by mouth daily.   Probiotic Product (ADVANCED PROBIOTIC PO) Take 1 tablet by mouth every morning.     Allergies:   Enalapril, Amiodarone, and Tylenol [acetaminophen]   Social History   Socioeconomic History   Marital status: Married    Spouse name: Not on file   Number of children: Not on file   Years of education: Not on file   Highest education level: Not on file  Occupational History   Not on file  Tobacco Use   Smoking status: Never   Smokeless tobacco: Never   Tobacco comments:    Never smoke 10/19/21  Vaping Use   Vaping Use: Never used  Substance and Sexual Activity   Alcohol use: No   Drug use: No   Sexual activity: Not on file  Other Topics Concern   Not on file  Social History Narrative   Not on file   Social Determinants of Health   Financial Resource Strain: Not on file  Food Insecurity: Not on file  Transportation Needs: Not on file  Physical Activity: Not on file  Stress: Not on file  Social Connections: Not on file     Family History: The patient's family history includes Breast cancer in her cousin; Breast cancer (age of onset: 37) in her maternal aunt.  ROS:   Please see the history of present illness.     All other systems reviewed and are negative.  EKGs/Labs/Other Studies Reviewed:    The following studies were reviewed today:   EKG:  EKG is  ordered today.  The ekg ordered today demonstrates sinus bradycardia. heart rate 53  Recent Labs: 12/30/2021: Hemoglobin 12.0; Platelets 240 03/09/2022: BUN 12; Creatinine, Ser 0.90; Magnesium  2.0; Potassium 3.9; Sodium 139  Recent Lipid Panel No results found for: "CHOL", "TRIG", "HDL", "CHOLHDL", "VLDL", "LDLCALC", "LDLDIRECT"  Physical Exam:    VS:  BP 136/68 (BP Location: Left Arm, Patient Position: Sitting, Cuff Size: Normal)   Pulse (!) 53   Ht 5' (1.524 m)   Wt 154 lb 3.2 oz (69.9 kg)   SpO2 100%   BMI 30.12 kg/m     Wt Readings from Last 3 Encounters:  04/25/22 154 lb 3.2 oz (69.9 kg)  03/09/22 147 lb (66.7 kg)  02/03/22 149 lb 6.4 oz (67.8 kg)  GEN:  Well nourished, well developed in no acute distress HEENT: Normal NECK: No JVD; No carotid bruits CARDIAC: Bradycardic, regular, no murmurs, rubs, gallops RESPIRATORY:  Clear to auscultation without rales, wheezing or rhonchi  ABDOMEN: Soft, non-tender, non-distended MUSCULOSKELETAL:  No edema; No deformity  SKIN: Warm and dry NEUROLOGIC:  Alert and oriented x 3 PSYCHIATRIC:  Normal affect   ASSESSMENT:    1. Coronary artery disease involving native coronary artery of native heart without angina pectoris   2. Chronic heart failure with preserved ejection fraction (HFpEF) (HCC)   3. Paroxysmal A-fib ([place])   4. Primary hypertension    PLAN:    In order of problems listed above:  CAD, moderate stenosis in the first diagonal and RCA. denies chest pain.  Continue Eliquis, Lipitor.  Echo 03/2020 EF 55 to 60%.   Grade 2 diastolic dysfunction.  Appears euvolemic, no edema, continue Lasix 20 mg daily. paroxysmal atrial fibrillation, s/p RFA 09/2021.  On Tikosyn, Eliquis.  Keep appointment with A-fib clinic.  Toprol-XL previously stopped due to bradycardia.   hypertension, blood pressure controlled, continue irbesartan 300 mg daily, Lasix.  Follow-up in 12 months.    Medication Adjustments/Labs and Tests Ordered: Current medicines are reviewed at length with the patient today.  Concerns regarding medicines are outlined above.  Orders Placed This Encounter  Procedures   EKG 12-Lead     No orders of the  defined types were placed in this encounter.     Patient Instructions  Medication Instructions:  Your physician recommends that you continue on your current medications as directed. Please refer to the Current Medication list given to you today.  *If you need a refill on your cardiac medications before your next appointment, please call your pharmacy*   Lab Work: None ordered If you have labs (blood work) drawn today and your tests are completely normal, you will receive your results only by: [place] (if you have MyChart) OR A paper copy in the mail If you have any lab test that is abnormal or we need to change your treatment, we will call you to review the results.   Testing/Procedures: None ordered   Follow-Up: At [facility], you and your health needs are our priority.  As part of our continuing mission to provide you with exceptional heart care, we have created designated Provider Care Teams.  These Care Teams include your primary Cardiologist (physician) and Advanced Practice Providers (APPs -  Physician Assistants and Nurse Practitioners) who all work together to provide you with the care you need, when you need it.  We recommend signing up for the patient portal called "MyChart".  Sign up information is provided on this After Visit Summary.  MyChart is used to connect with patients for Virtual Visits (Telemedicine).  Patients are able to view lab/test results, encounter notes, upcoming appointments, etc.  Non-urgent messages can be sent to your provider as well.   To learn more about what you can do with MyChart, go to [email or web address].    Your next appointment:   Your physician wants you to follow-up in: 1 year You will receive a reminder letter in the mail two months in advance. If you don't receive a letter, please call our office to schedule the follow-up appointment.   The format for your next appointment:   In Person  Provider:   You may see  Kate Sable, MD or one of the following Advanced Practice Providers on your designated Care Team:   Harrell Gave  Sharolyn Douglas, NP Christell Faith, PA-C Cadence Kathlen Mody, PA-C Gerrie Nordmann, NP   Other Instructions N/A  Important Information About Sugar         Signed, Kate Sable, MD  04/25/2022 11:54 AM    [place]

## 2022-04-28 DIAGNOSIS — L570 Actinic keratosis: Secondary | ICD-10-CM | POA: Diagnosis not present

## 2022-04-28 DIAGNOSIS — D2261 Melanocytic nevi of right upper limb, including shoulder: Secondary | ICD-10-CM | POA: Diagnosis not present

## 2022-04-28 DIAGNOSIS — L821 Other seborrheic keratosis: Secondary | ICD-10-CM | POA: Diagnosis not present

## 2022-04-28 DIAGNOSIS — E236 Other disorders of pituitary gland: Secondary | ICD-10-CM | POA: Diagnosis not present

## 2022-04-28 DIAGNOSIS — M1712 Unilateral primary osteoarthritis, left knee: Secondary | ICD-10-CM | POA: Diagnosis not present

## 2022-04-28 DIAGNOSIS — D2271 Melanocytic nevi of right lower limb, including hip: Secondary | ICD-10-CM | POA: Diagnosis not present

## 2022-04-28 DIAGNOSIS — E039 Hypothyroidism, unspecified: Secondary | ICD-10-CM | POA: Diagnosis not present

## 2022-04-28 DIAGNOSIS — D485 Neoplasm of uncertain behavior of skin: Secondary | ICD-10-CM | POA: Diagnosis not present

## 2022-04-28 DIAGNOSIS — D2262 Melanocytic nevi of left upper limb, including shoulder: Secondary | ICD-10-CM | POA: Diagnosis not present

## 2022-04-28 DIAGNOSIS — D2272 Melanocytic nevi of left lower limb, including hip: Secondary | ICD-10-CM | POA: Diagnosis not present

## 2022-05-07 ENCOUNTER — Other Ambulatory Visit (HOSPITAL_COMMUNITY): Payer: Self-pay | Admitting: Physician Assistant

## 2022-05-16 ENCOUNTER — Other Ambulatory Visit: Payer: Self-pay

## 2022-05-16 MED ORDER — IRBESARTAN 300 MG PO TABS: 300.0000 mg | ORAL_TABLET | Freq: Every day | ORAL | 3 refills | Status: DC

## 2022-06-05 ENCOUNTER — Other Ambulatory Visit (HOSPITAL_COMMUNITY): Payer: Self-pay | Admitting: Physician Assistant

## 2022-06-15 ENCOUNTER — Encounter: Payer: Self-pay | Admitting: Cardiology

## 2022-06-15 ENCOUNTER — Ambulatory Visit (INDEPENDENT_AMBULATORY_CARE_PROVIDER_SITE_OTHER): Payer: Medicare HMO

## 2022-06-15 ENCOUNTER — Ambulatory Visit: Payer: Medicare HMO | Attending: Cardiology | Admitting: Cardiology

## 2022-06-15 ENCOUNTER — Other Ambulatory Visit
Admission: RE | Admit: 2022-06-15 | Discharge: 2022-06-15 | Disposition: A | Discharge status: Home / Self Care | Payer: Medicare HMO | Source: Ambulatory Visit | Attending: Cardiology | Admitting: Cardiology

## 2022-06-15 VITALS — BP 148/72 | HR 54 | Ht 60.0 in

## 2022-06-15 DIAGNOSIS — E039 Hypothyroidism, unspecified: Secondary | ICD-10-CM

## 2022-06-15 DIAGNOSIS — E785 Hyperlipidemia, unspecified: Secondary | ICD-10-CM

## 2022-06-15 DIAGNOSIS — I2510 Atherosclerotic heart disease of native coronary artery without angina pectoris: Secondary | ICD-10-CM

## 2022-06-15 DIAGNOSIS — I480 Paroxysmal atrial fibrillation: Secondary | ICD-10-CM | POA: Diagnosis not present

## 2022-06-15 DIAGNOSIS — Z79899 Other long term (current) drug therapy: Secondary | ICD-10-CM

## 2022-06-15 LAB — BASIC METABOLIC PANEL
Anion gap: 9 (ref 5–15)
BUN: 21 mg/dL (ref 8–23)
CO2: 24 mmol/L (ref 22–32)
Calcium: 9.2 mg/dL (ref 8.9–10.3)
Chloride: 108 mmol/L (ref 98–111)
Creatinine, Ser: 0.93 mg/dL (ref 0.44–1.00)
GFR, Estimated: >60 mL/min (ref >60)
Glucose, Bld: 103 mg/dL — ABNORMAL HIGH (ref 70–99)
Potassium: 4.1 mmol/L (ref 3.5–5.1)
Sodium: 141 mmol/L (ref 135–145)

## 2022-06-15 LAB — CBC
HCT: 36.3 % (ref 36.0–46.0)
Hemoglobin: 11.9 g/dL — ABNORMAL LOW (ref 12.0–15.0)
MCH: 30.8 pg (ref 26.0–34.0)
MCHC: 32.8 g/dL (ref 30.0–36.0)
MCV: 94 fL (ref 80.0–100.0)
Platelets: 232 10*3/uL (ref 150–400)
RBC: 3.86 MIL/uL — ABNORMAL LOW (ref 3.87–5.11)
RDW: 15.3 % (ref 11.5–15.5)
WBC: 8.5 10*3/uL (ref 4.0–10.5)
nRBC: 0 % (ref 0.0–0.2)

## 2022-06-15 LAB — LIPID PANEL
Cholesterol: 170 mg/dL (ref 0–200)
HDL: 67 mg/dL (ref >40)
LDL Cholesterol: 86 mg/dL (ref 0–99)
Total CHOL/HDL Ratio: 2.5 RATIO
Triglycerides: 83 mg/dL (ref <150)
VLDL: 17 mg/dL (ref 0–40)

## 2022-06-15 LAB — TSH: TSH: 3.325 u[IU]/mL (ref 0.350–4.500)

## 2022-06-15 LAB — MAGNESIUM: Magnesium: 2.1 mg/dL (ref 1.7–2.4)

## 2022-06-15 NOTE — Progress Notes (Signed)
Electrophysiology Office Follow up Visit Note:    Date:  06/15/2022   ID:  [name], DOB 1941-04-30, MRN [id number]  PCP:  Charlene Body, MD  [facility] Cardiologist:  Kate Sable, MD  [facility] Electrophysiologist:  Vickie Epley, MD    Interval History:    Charlene Coleman is a 81 y.o. female who presents for a follow up visit.  She has persistent AF s/p PVI 09/21/2021 and dofetilide 12/2021. She has done well maintaining normal rhythm. She last saw Charlene Coleman in the AF clinic 03/09/2022. She takes Eliquis for stroke ppx.  04/25/2022 saw Dr Charlene Coleman.   Today she is doing well.  She has not had a sustained episode of atrial fibrillation.  She does report intermittent episodes where she has an urge to belch.  She says that when she has this episode she alters her breathing pattern for 1 to 2 seconds and the episode passes.  She repeatedly describes as a belching abnormality and she wonders whether or not she may be having reflux.  She says it is different from her previous arrhythmia episodes.  She has an upcoming appointment with her primary care physician.     Past Medical History:  Diagnosis Date   (HFpEF) heart failure with preserved ejection fraction ([place])    a. 06/2021 Echo: EF 60-65%, no rwma, mild asymm LVH of basal-septal segment. Nl RV fxn. RVSP 23.4mHg. Mild MR. AoV sclerosis w/o stenosis.   Aortic valve sclerosis    a. 06/2021 Echo: Ao sclerosis w/o stenosis.   Borderline diabetes    [place]   Left side - radiation - lumpectomy   CAD (coronary artery disease)    a. 12/2021 Cath: LM nl, LAD 25p/m, D1 25, RI nl, LCX nl, RCA 20/30p, 537mMed rx.   DCIS (ductal carcinoma in situ)    Dyspnea    Dysrhythmia    Empty sella syndrome (HCC)    GERD (gastroesophageal reflux disease)    Gout    Hyperlipidemia    Hypertension    [place]mal [place]) ([place]   a. Amio stopped 2/2 thyroid dysfunction; b. 09/2021 s/p PVI.    Personal history of radiation therapy    Skin cancer 2011    Past Surgical History:  Procedure Laterality Date   ABDOMINAL HYSTERECTOMY     ATRIAL FIBRILLATION ABLATION N/A 09/21/2021   Procedure: ATRIAL FIBRILLATION ABLATION;  Surgeon: Charlene Coleman;  [facility];  Service: Cardiovascular;  Laterality: N/A;   BREAST BIOPSY Left 2015   positive   BREAST LUMPECTOMY Left 09/23/2013   CATARACT EXTRACTION Bilateral    CHOLECYSTECTOMY     ELECTROPHYSIOLOGIC STUDY     GASTRIC FUNDOPLICATION     JOINT REPLACEMENT Right 09/22/2020   R hip replacement   LEFT HEART CATH AND CORONARY ANGIOGRAPHY N/A 12/30/2021   Procedure: LEFT HEART CATH AND CORONARY ANGIOGRAPHY;  Surgeon: Charlene Coleman;  [facility];  Service: Cardiovascular;  Laterality: N/A;   left lumpectomy      Current Medications: Current Meds  Medication Sig   allopurinol (ZYLOPRIM) 300 MG tablet Take 300 mg by mouth daily.    apixaban (ELIQUIS) 5 MG TABS tablet Take 1 tablet (5 mg total) by mouth 2 (two) times daily.   atorvastatin (LIPITOR) 20 MG tablet Take 20 mg by mouth every evening.   Calcium Carbonate-Vitamin D 600-400 MG-UNIT per tablet Take 1 tablet by mouth 3 (three) times a  week.    carboxymethylcellulose (REFRESH PLUS) 0.5 % SOLN Place 1 drop into both eyes daily as needed (dry eyes).   dofetilide (TIKOSYN) 250 MCG capsule TAKE ONE CAPSULE BY MOUTH TWICE A DAY   famotidine (PEPCID) 20 MG tablet Take 20 mg by mouth at bedtime.   furosemide (LASIX) 20 MG tablet Take 1 tablet (20 mg total) by mouth daily. For swelling.   gabapentin (NEURONTIN) 300 MG capsule Take 300 mg by mouth 2 (two) times daily at 10 AM and 5 PM.   irbesartan (AVAPRO) 300 MG tablet Take 1 tablet (300 mg total) by mouth daily.   oxybutynin (DITROPAN-XL) 5 MG 24 hr tablet Take 5 mg by mouth in the morning.   potassium chloride SA (KLOR-CON M) 20 MEQ tablet TAKE 1 TABLET BY MOUTH DAILY   Probiotic  Product (ADVANCED PROBIOTIC PO) Take 1 tablet by mouth every morning.     Allergies:   Enalapril, Amiodarone, and Tylenol [acetaminophen]   Social History   Socioeconomic History   Marital status: Married    Spouse name: Not on file   Number of children: Not on file   Years of education: Not on file   Highest education level: Not on file  Occupational History   Not on file  Tobacco Use   Smoking status: Never   Smokeless tobacco: Never   Tobacco comments:    Never smoke 10/19/21  Vaping Use   Vaping Use: Never used  Substance and Sexual Activity   Alcohol use: No   Drug use: No   Sexual activity: Not on file  Other Topics Concern   Not on file  Social History Narrative   Not on file   Social Determinants of Health   Financial Resource Strain: Not on file  Food Insecurity: Not on file  Transportation Needs: Not on file  Physical Activity: Not on file  Stress: Not on file  Social Connections: Not on file     Family History: The patient's family history includes Breast cancer in her cousin; Breast cancer (age of onset: 33) in her maternal aunt.  ROS:   Please see the history of present illness.    All other systems reviewed and are negative.  EKGs/Labs/Other Studies Reviewed:    The following studies were reviewed today:   EKG:  The ekg ordered today demonstrates sinus rhythm.  QTc is 426 ms.  Recent Labs: 12/30/2021: Hemoglobin 12.0; Platelets 240 03/09/2022: BUN 12; Creatinine, Ser 0.90; Magnesium 2.0; Potassium 3.9; Sodium 139  Recent Lipid Panel No results found for: "CHOL", "TRIG", "HDL", "CHOLHDL", "VLDL", "LDLCALC", "LDLDIRECT"  Physical Exam:    VS:  BP (!) 148/72   Pulse (!) 54   Ht 5' (1.524 m)   SpO2 98%   BMI 30.12 kg/m     Wt Readings from Last 3 Encounters:  04/25/22 154 lb 3.2 oz (69.9 kg)  03/09/22 147 lb (66.7 kg)  02/03/22 149 lb 6.4 oz (67.8 kg)     GEN:  Well nourished, well developed in no acute distress HEENT: Normal NECK:  No JVD; No carotid bruits LYMPHATICS: No lymphadenopathy CARDIAC: RRR, no murmurs, rubs, gallops RESPIRATORY:  Clear to auscultation without rales, wheezing or rhonchi  ABDOMEN: Soft, non-tender, non-distended MUSCULOSKELETAL:  No edema; No deformity  SKIN: Warm and dry NEUROLOGIC:  Alert and oriented x 3 PSYCHIATRIC:  Normal affect        ASSESSMENT:    1. Paroxysmal atrial fibrillation (HCC)   2. Coronary artery disease involving  native coronary artery, unspecified whether angina present, unspecified whether native or transplanted heart   3. Hypothyroidism, unspecified type   4. Hyperlipidemia, unspecified hyperlipidemia type    PLAN:    In order of problems listed above:  #Persistent AF Maintiaining sinus on dofetilide s/p ablation in 09/2021. Continue dofetilide. Continue eliquis I will check a BMP and magnesium today for her Tikosyn.  #CAD No anginal symptoms today. Recheck lipid panel today.  #Hypothyroidism Check TSH in anticipation of upcoming primary care appointment.  F/u w APP in 3 months.   Medication Adjustments/Labs and Tests Ordered: Current medicines are reviewed at length with the patient today.  Concerns regarding medicines are outlined above.  No orders of the defined types were placed in this encounter.  No orders of the defined types were placed in this encounter.    Signed, Lars Mage, MD, [facility], [facility] 06/15/2022 11:11 AM    Electrophysiology [facility]

## 2022-06-15 NOTE — Patient Instructions (Addendum)
Medication Instructions:  None *If you need a refill on your cardiac medications before your next appointment, please call your pharmacy*   Lab Work: BMP, Mag, CBC, Lipid, TSH  If you have labs (blood work) drawn today and your tests are completely normal, you will receive your results only by: [place] (if you have MyChart) OR A paper copy in the mail If you have any lab test that is abnormal or we need to change your treatment, we will call you to review the results.   Testing/Procedures: Your physician has recommended that you wear a heart monitor. Heart monitors are medical devices that record the heart's electrical activity. Doctors most often use these monitors to diagnose arrhythmias. Arrhythmias are problems with the speed or rhythm of the heartbeat. The monitor is a small, portable device. You can wear one while you do your normal daily activities. This is usually used to diagnose what is causing palpitations/syncope (passing out).    Follow-Up: At [facility], you and your health needs are our priority.  As part of our continuing mission to provide you with exceptional heart care, we have created designated Provider Care Teams.  These Care Teams include your primary Cardiologist (physician) and Advanced Practice Providers (APPs -  Physician Assistants and Nurse Practitioners) who all work together to provide you with the care you need, when you need it.  We recommend signing up for the patient portal called "MyChart".  Sign up information is provided on this After Visit Summary.  MyChart is used to connect with patients for Virtual Visits (Telemedicine).  Patients are able to view lab/test results, encounter notes, upcoming appointments, etc.  Non-urgent messages can be sent to your provider as well.   To learn more about what you can do with MyChart, go to [email or web address].    Your next appointment:   3 month(s)  The format for your next appointment:    In Person  Provider:   Lars Mage, MD    Other Instructions ZIO AT Long term monitor-Live Telemetry  Your physician has requested you wear a ZIO patch monitor for 3 days.  This is a single patch monitor. [employer] supplies one patch monitor per enrollment. Additional  stickers are not available.  Please do not apply patch if you will be having a Nuclear Stress Test, Echocardiogram, Cardiac CT, MRI,  or Chest Xray during the period you would be wearing the monitor. The patch cannot be worn during  these tests. You cannot remove and re-apply the ZIO AT patch monitor.  Your ZIO patch monitor will be mailed 3 day USPS to your address on file. It may take 3-5 days to  receive your monitor after you have been enrolled.  Once you have received your monitor, please review the enclosed instructions. Your monitor has  already been registered assigning a specific monitor serial # to you.   Billing and Patient Assistance Program information  Theodore Demark has been supplied with any insurance information on record for billing. [employer] offers a sliding scale Patient Assistance Program for patients without insurance, or whose  insurance does not completely cover the cost of the ZIO patch monitor. You must apply for the  Patient Assistance Program to qualify for the discounted rate. To apply, call [employer] at [phone],  select option 4, select option 2 , ask to apply for the Patient Assistance Program, (you can request an  interpreter if needed). [employer] will ask your household income and how many people are in your  household. [employer] will quote your out-of-pocket cost based on this information. They will also be able  to set up a 12 month interest free payment plan if needed.  Applying the monitor   Shave hair from upper left chest.  Hold the abrader disc by orange tab. Rub the abrader in 40 strokes over left upper chest as indicated in  your monitor instructions.  Clean area with 4 enclosed  alcohol pads. Use all pads to ensure the area is cleaned thoroughly. Let  dry.  Apply patch as indicated in monitor instructions. Patch will be placed under collarbone on left side of  chest with arrow pointing upward.  Rub patch adhesive wings for 2 minutes. Remove the white label marked "1". Remove the white label  marked "2". Rub patch adhesive wings for 2 additional minutes.  While looking in a mirror, press and release button in center of patch. A small green light will flash 3-4  times. This will be your only indicator that the monitor has been turned on.  Do not shower for the first 24 hours. You may shower after the first 24 hours.  Press the button if you feel a symptom. You will hear a small click. Record Date, Time and Symptom in  the Patient Log.   Starting the Gateway  In your kit there is a Hydrographic surveyor box the size of a cellphone. This is Airline pilot. It transmits all your  recorded data to [facility]. This box must always stay within 10 feet of you. Open the box and push the *  button. There will be a light that blinks orange and then green a few times. When the light stops  blinking, the Gateway is connected to the ZIO patch. Call [employer] at [phone] to confirm your monitor is transmitting.  Returning your monitor  Remove your patch and place it inside the [place]. In the lower half of the Gateway there is a white  bag with prepaid postage on it. Place Gateway in bag and seal. Mail package back to [place] as soon as  possible. Your physician should have your final report approximately 7 days after you have mailed back  your monitor. Call [place] at [phone] if you have questions regarding your ZIO AT  patch monitor. Call them immediately if you see an orange light blinking on your monitor.  If your monitor falls off in less than 4 days, contact our Monitor department at [phone]. If your  monitor becomes loose or falls off after 4  days call [employer] at [phone] for suggestions on  securing your monitor   Important Information About [place]

## 2022-06-17 DIAGNOSIS — I2510 Atherosclerotic heart disease of native coronary artery without angina pectoris: Secondary | ICD-10-CM

## 2022-06-17 DIAGNOSIS — I480 Paroxysmal atrial fibrillation: Secondary | ICD-10-CM

## 2022-06-17 DIAGNOSIS — Z79899 Other long term (current) drug therapy: Secondary | ICD-10-CM | POA: Diagnosis not present

## 2022-06-17 DIAGNOSIS — E039 Hypothyroidism, unspecified: Secondary | ICD-10-CM | POA: Diagnosis not present

## 2022-06-17 DIAGNOSIS — E785 Hyperlipidemia, unspecified: Secondary | ICD-10-CM

## 2022-06-28 DIAGNOSIS — E039 Hypothyroidism, unspecified: Secondary | ICD-10-CM | POA: Diagnosis not present

## 2022-06-30 DIAGNOSIS — E785 Hyperlipidemia, unspecified: Secondary | ICD-10-CM | POA: Diagnosis not present

## 2022-06-30 DIAGNOSIS — I480 Paroxysmal atrial fibrillation: Secondary | ICD-10-CM | POA: Diagnosis not present

## 2022-07-18 DIAGNOSIS — I10 Essential (primary) hypertension: Secondary | ICD-10-CM | POA: Diagnosis not present

## 2022-07-18 DIAGNOSIS — E782 Mixed hyperlipidemia: Secondary | ICD-10-CM | POA: Diagnosis not present

## 2022-07-26 DIAGNOSIS — E785 Hyperlipidemia, unspecified: Secondary | ICD-10-CM | POA: Diagnosis not present

## 2022-07-26 DIAGNOSIS — Z0000 Encounter for general adult medical examination without abnormal findings: Secondary | ICD-10-CM | POA: Diagnosis not present

## 2022-07-26 DIAGNOSIS — M5416 Radiculopathy, lumbar region: Secondary | ICD-10-CM | POA: Insufficient documentation

## 2022-07-26 DIAGNOSIS — Z1331 Encounter for screening for depression: Secondary | ICD-10-CM | POA: Diagnosis not present

## 2022-07-26 DIAGNOSIS — I10 Essential (primary) hypertension: Secondary | ICD-10-CM | POA: Diagnosis not present

## 2022-08-02 ENCOUNTER — Other Ambulatory Visit: Payer: Self-pay | Admitting: Family Medicine

## 2022-08-02 DIAGNOSIS — Z1231 Encounter for screening mammogram for malignant neoplasm of breast: Secondary | ICD-10-CM

## 2022-08-03 ENCOUNTER — Other Ambulatory Visit (HOSPITAL_COMMUNITY): Payer: Self-pay | Admitting: *Deleted

## 2022-08-03 MED ORDER — POTASSIUM CHLORIDE CRYS ER 20 MEQ PO TBCR: 20.0000 meq | EXTENDED_RELEASE_TABLET | Freq: Every day | ORAL | 2 refills | Status: DC

## 2022-08-05 ENCOUNTER — Other Ambulatory Visit (HOSPITAL_COMMUNITY): Payer: Self-pay | Admitting: *Deleted

## 2022-08-05 MED ORDER — POTASSIUM CHLORIDE CRYS ER 20 MEQ PO TBCR: 20.0000 meq | EXTENDED_RELEASE_TABLET | Freq: Every day | ORAL | 2 refills | Status: DC

## 2022-08-22 DIAGNOSIS — M25562 Pain in left knee: Secondary | ICD-10-CM | POA: Diagnosis not present

## 2022-08-22 DIAGNOSIS — M1712 Unilateral primary osteoarthritis, left knee: Secondary | ICD-10-CM | POA: Diagnosis not present

## 2022-09-12 ENCOUNTER — Encounter (HOSPITAL_COMMUNITY): Payer: Self-pay

## 2022-09-12 ENCOUNTER — Ambulatory Visit (HOSPITAL_COMMUNITY): Payer: Medicare HMO | Admitting: Physician Assistant

## 2022-09-18 NOTE — Progress Notes (Unsigned)
Electrophysiology Office Follow up Visit Note:    Date:  09/21/2022   ID:  Charlene Coleman, DOB 1941/07/18, MRN [id number]  PCP:  Dion Body, MD  [facility] Cardiologist:  Kate Sable, MD  [facility] Electrophysiologist:  Vickie Epley, MD    Interval History:    Charlene Coleman is a 82 y.o. female who presents for a follow up visit. They were last seen in clinic 06/15/2022 for pAF. She had a PVI 09/21/2021 and is maintained on dofetilide. At the last appointment she described episodes of belching that she wondered could represent arrhythmias.  She wore a heart monitor 07/2022 without any AF episodes.   Today she presents for follow up.  Last week she had 1 episode of atrial fibrillation on Wednesday that lasted 1 hour.  During the episode her blood pressure went up to about 0000000 mmHg systolic but then came back down to normal after the atrial fibrillation had subsided.  She is overall pleased with the burden of atrial fibrillation after her ablation and with Tikosyn use.       Past Medical History:  Diagnosis Date   (HFpEF) heart failure with preserved ejection fraction ([place])    a. 06/2021 Echo: EF 60-65%, no rwma, mild asymm LVH of basal-septal segment. Nl RV fxn. RVSP 23.60mHg. Mild MR. AoV sclerosis w/o stenosis.   Aortic valve sclerosis    a. 06/2021 Echo: Ao sclerosis w/o stenosis.   Borderline diabetes    Breast cancer ([place] 2015   Left side - radiation - lumpectomy   CAD (coronary artery disease)    a. 12/2021 Cath: LM nl, LAD 25p/m, D1 25, RI nl, LCX nl, RCA 20/30p, 527mMed rx.   DCIS (ductal carcinoma in situ)    Dyspnea    Dysrhythmia    Empty sella syndrome (HCC)    GERD (gastroesophageal reflux disease)    Gout    Hyperlipidemia    Hypertension    PAF (paroxysmal atrial fibrillation) ([place]   a. Amio stopped 2/2 thyroid dysfunction; b. 09/2021 s/p PVI.   Personal history of radiation therapy    Skin cancer 2011    Past Surgical History:   Procedure Laterality Date   ABDOMINAL HYSTERECTOMY     ATRIAL FIBRILLATION ABLATION N/A 09/21/2021   Procedure: ATRIAL FIBRILLATION ABLATION;  Surgeon: LaVickie EpleyMD;  [facility];  Service: Cardiovascular;  Laterality: N/A;   BREAST BIOPSY Left 2015   positive   BREAST LUMPECTOMY Left 09/23/2013   CATARACT EXTRACTION Bilateral    CHOLECYSTECTOMY     ELECTROPHYSIOLOGIC STUDY     GASTRIC FUNDOPLICATION     JOINT REPLACEMENT Right 09/22/2020   R hip replacement   LEFT HEART CATH AND CORONARY ANGIOGRAPHY N/A 12/30/2021   Procedure: LEFT HEART CATH AND CORONARY ANGIOGRAPHY;  Surgeon: EnNelva BushMD;  [facility];  Service: Cardiovascular;  Laterality: N/A;   left lumpectomy      Current Medications: Current Meds  Medication Sig   allopurinol (ZYLOPRIM) 300 MG tablet Take 300 mg by mouth daily.    apixaban (ELIQUIS) 5 MG TABS tablet Take 1 tablet (5 mg total) by mouth 2 (two) times daily.   atorvastatin (LIPITOR) 20 MG tablet Take 20 mg by mouth every evening.   Calcium Carbonate-Vitamin D 600-400 MG-UNIT per tablet Take 1 tablet by mouth 3 (three) times a week.    carboxymethylcellulose (REFRESH PLUS) 0.5 % SOLN Place 1 drop into both eyes daily as  needed (dry eyes).   dofetilide (TIKOSYN) 250 MCG capsule TAKE ONE CAPSULE BY MOUTH TWICE A DAY   famotidine (PEPCID) 20 MG tablet Take 20 mg by mouth at bedtime.   furosemide (LASIX) 20 MG tablet Take 1 tablet (20 mg total) by mouth daily. For swelling.   gabapentin (NEURONTIN) 300 MG capsule Take 300 mg by mouth 2 (two) times daily at 10 AM and 5 PM.   irbesartan (AVAPRO) 300 MG tablet Take 1 tablet (300 mg total) by mouth daily.   oxybutynin (DITROPAN-XL) 5 MG 24 hr tablet Take 5 mg by mouth in the morning.   potassium chloride SA (KLOR-CON M) 20 MEQ tablet Take 1 tablet (20 mEq total) by mouth daily.   Probiotic Product (ADVANCED PROBIOTIC PO) Take 1 tablet by mouth every morning.      Allergies:   Enalapril, Amiodarone, and Tylenol [acetaminophen]   Social History   Socioeconomic History   Marital status: Married    Spouse name: Not on file   Number of children: Not on file   Years of education: Not on file   Highest education level: Not on file  Occupational History   Not on file  Tobacco Use   Smoking status: Never   Smokeless tobacco: Never   Tobacco comments:    Never smoke 10/19/21  Vaping Use   Vaping Use: Never used  Substance and Sexual Activity   Alcohol use: No   Drug use: No   Sexual activity: Not on file  Other Topics Concern   Not on file  Social History Narrative   Not on file   Social Determinants of Health   Financial Resource Strain: Not on file  Food Insecurity: Not on file  Transportation Needs: Not on file  Physical Activity: Not on file  Stress: Not on file  Social Connections: Not on file     Family History: The patient's family history includes Breast cancer in her cousin; Breast cancer (age of onset: 42) in her maternal aunt.  ROS:   Please see the history of present illness.    All other systems reviewed and are negative.  EKGs/Labs/Other Studies Reviewed:    The following studies were reviewed today:   EKG:  The ekg ordered today demonstrates sinus rhythm. Qtc 446m  Recent Labs: 06/15/2022: BUN 21; Creatinine, Ser 0.93; Hemoglobin 11.9; Magnesium 2.1; Platelets 232; Potassium 4.1; Sodium 141; TSH 3.325  Recent Lipid Panel    Component Value Date/Time   CHOL 170 06/15/2022 1157   TRIG 83 06/15/2022 1157   HDL 67 06/15/2022 1157   CHOLHDL 2.5 06/15/2022 1157   VLDL 17 06/15/2022 1157   LDLCALC 86 06/15/2022 1157    Physical Exam:    VS:  BP 136/74   Pulse (!) 50   Ht 5' (1.524 m)   Wt 157 lb 3.2 oz (71.3 kg)   SpO2 98%   BMI 30.70 kg/m     Wt Readings from Last 3 Encounters:  09/21/22 157 lb 3.2 oz (71.3 kg)  04/25/22 154 lb 3.2 oz (69.9 kg)  03/09/22 147 lb (66.7 kg)     GEN:  Well  nourished, well developed in no acute distress CARDIAC: RRR, no murmurs, rubs, gallops RESPIRATORY:  Clear to auscultation without rales, wheezing or rhonchi        ASSESSMENT:    1. Persistent atrial fibrillation ([place]   2. Primary hypertension   3. Chronic heart failure with preserved ejection fraction (HFpEF) ([place]   4. Encounter  for long-term (current) use of high-risk medication    PLAN:    In order of problems listed above:  #Persistent AF #Dofetilide use Maintaining normal rhythm after ablation on dofetilide. Continue apixaban Continue dofetilide 232mg PO BID.  Check BMP and magnesium today.  QTc stable for continued use of tikosyn  #Hypertension at goal today.  Recommend checking blood pressures 1-2 times per week at home and recording the values.  Recommend bringing these recordings to the primary care physician.   #chronic diastolic heart failure NYHA II. Warm and dry. Continue current medical therapy.  Follow up 3 months with APP.      Medication Adjustments/Labs and Tests Ordered: Current medicines are reviewed at length with the patient today.  Concerns regarding medicines are outlined above.  Orders Placed This Encounter  Procedures   Basic metabolic panel   Magnesium   EKG 12-Lead   No orders of the defined types were placed in this encounter.    Signed, CLars Mage MD, [facility]2/21/2024 10:53 AM    Electrophysiology [facility]

## 2022-09-21 ENCOUNTER — Encounter: Payer: Self-pay | Admitting: Cardiology

## 2022-09-21 ENCOUNTER — Ambulatory Visit: Payer: Medicare HMO | Attending: Cardiology | Admitting: Cardiology

## 2022-09-21 ENCOUNTER — Other Ambulatory Visit
Admission: RE | Admit: 2022-09-21 | Discharge: 2022-09-21 | Disposition: A | Discharge status: Home / Self Care | Payer: Medicare HMO | Source: Ambulatory Visit | Attending: Cardiology | Admitting: Cardiology

## 2022-09-21 VITALS — BP 136/74 | HR 50 | Ht 60.0 in | Wt 157.2 lb

## 2022-09-21 DIAGNOSIS — Z79899 Other long term (current) drug therapy: Secondary | ICD-10-CM

## 2022-09-21 DIAGNOSIS — I5032 Chronic diastolic (congestive) heart failure: Secondary | ICD-10-CM | POA: Diagnosis not present

## 2022-09-21 DIAGNOSIS — I4819 Other persistent atrial fibrillation: Secondary | ICD-10-CM

## 2022-09-21 DIAGNOSIS — I10 Essential (primary) hypertension: Secondary | ICD-10-CM | POA: Diagnosis not present

## 2022-09-21 LAB — BASIC METABOLIC PANEL
Anion gap: 7 (ref 5–15)
BUN: 14 mg/dL (ref 8–23)
CO2: 26 mmol/L (ref 22–32)
Calcium: 9.3 mg/dL (ref 8.9–10.3)
Chloride: 104 mmol/L (ref 98–111)
Creatinine, Ser: 0.93 mg/dL (ref 0.44–1.00)
GFR, Estimated: >60 mL/min (ref >60)
Glucose, Bld: 95 mg/dL (ref 70–99)
Potassium: 4.2 mmol/L (ref 3.5–5.1)
Sodium: 137 mmol/L (ref 135–145)

## 2022-09-21 LAB — MAGNESIUM: Magnesium: 2.1 mg/dL (ref 1.7–2.4)

## 2022-09-21 NOTE — Patient Instructions (Signed)
Medication Instructions:  Your physician recommends that you continue on your current medications as directed. Please refer to the Current Medication list given to you today.  *If you need a refill on your cardiac medications before your next appointment, please call your pharmacy*  Lab Work: BMET and [place] will get your lab work at [facility]) hospital.  Your lab work will be done at [facility] next to [employer].  These are walk in labs- you will not need an appointment and you do not need to be fasting.  ,  Follow-Up: At [facility], you and your health needs are our priority.  As part of our continuing mission to provide you with exceptional heart care, we have created designated Provider Care Teams.  These Care Teams include your primary Cardiologist (physician) and Advanced Practice Providers (APPs -  Physician Assistants and Nurse Practitioners) who all work together to provide you with the care you need, when you need it.  Your next appointment:   3 month(s)  Provider:   You will see one of the following Advanced Practice Providers on your designated Care Team:   Tommye Standard, [place]" [place], [place], NP

## 2022-09-22 ENCOUNTER — Ambulatory Visit (HOSPITAL_COMMUNITY): Payer: Medicare HMO | Admitting: Physician Assistant

## 2022-09-22 ENCOUNTER — Ambulatory Visit
Admission: RE | Admit: 2022-09-22 | Discharge: 2022-09-22 | Disposition: A | Discharge status: Home / Self Care | Payer: Medicare HMO | Source: Ambulatory Visit | Attending: Family Medicine | Admitting: Family Medicine

## 2022-09-22 DIAGNOSIS — Z1231 Encounter for screening mammogram for malignant neoplasm of breast: Secondary | ICD-10-CM | POA: Diagnosis not present

## 2022-09-29 DIAGNOSIS — E039 Hypothyroidism, unspecified: Secondary | ICD-10-CM | POA: Diagnosis not present

## 2022-10-20 DIAGNOSIS — E236 Other disorders of pituitary gland: Secondary | ICD-10-CM | POA: Diagnosis not present

## 2022-10-20 DIAGNOSIS — E039 Hypothyroidism, unspecified: Secondary | ICD-10-CM | POA: Diagnosis not present

## 2022-11-23 DIAGNOSIS — M1712 Unilateral primary osteoarthritis, left knee: Secondary | ICD-10-CM | POA: Diagnosis not present

## 2022-11-23 DIAGNOSIS — M25562 Pain in left knee: Secondary | ICD-10-CM | POA: Diagnosis not present

## 2022-12-01 ENCOUNTER — Other Ambulatory Visit: Payer: Self-pay

## 2022-12-01 ENCOUNTER — Other Ambulatory Visit: Payer: Self-pay | Admitting: *Deleted

## 2022-12-01 DIAGNOSIS — I480 Paroxysmal atrial fibrillation: Secondary | ICD-10-CM

## 2022-12-01 MED ORDER — FUROSEMIDE 20 MG PO TABS: 20.0000 mg | ORAL_TABLET | Freq: Every day | ORAL | 0 refills | Status: DC

## 2022-12-01 MED ORDER — APIXABAN 5 MG PO TABS: 5.0000 mg | ORAL_TABLET | Freq: Two times a day (BID) | ORAL | 1 refills | Status: DC

## 2022-12-01 NOTE — Telephone Encounter (Signed)
Prescription refill request for Eliquis received. Indication: afib  Last office visit: lambert, 09/21/2022 Scr: 0.93, 09/21/2022 Age: 82 Weight:  71.3 kg   Refill sent.

## 2022-12-02 ENCOUNTER — Telehealth: Payer: Self-pay | Admitting: Cardiology

## 2022-12-02 DIAGNOSIS — I480 Paroxysmal atrial fibrillation: Secondary | ICD-10-CM

## 2022-12-02 MED ORDER — APIXABAN 5 MG PO TABS: 5.0000 mg | ORAL_TABLET | Freq: Two times a day (BID) | ORAL | 0 refills | Status: DC

## 2022-12-02 NOTE — Telephone Encounter (Signed)
Refill request

## 2022-12-02 NOTE — Telephone Encounter (Signed)
*  STAT* If patient is at the pharmacy, call can be transferred to refill team.   1. Which medications need to be refilled? (please list name of each medication and dose if known)   apixaban (ELIQUIS) 5 MG TABS tablet    2. Which pharmacy/location (including street and city if local pharmacy) is medication to be sent to?   [facility] [address] [place] - [place], [place] - [address] Phone: [phone]  Fax: [phone]      3. Do they need a 30 day or 90 day supply? 15 day   Vernona Rieger from [facility] called stating pt refill will be late and she is completely out. Please send 15 day supply to local pharmacy

## 2022-12-02 NOTE — Telephone Encounter (Signed)
Prescription refill request for Eliquis received. Indication: Afib  Last office visit: 09/21/22 Lalla Brothers)  Scr: 0.93 (09/21/22)  Age: 82 Weight: 71.3kg  Appropriate dose. Refill sent.

## 2022-12-08 DIAGNOSIS — M19042 Primary osteoarthritis, left hand: Secondary | ICD-10-CM | POA: Diagnosis not present

## 2022-12-08 DIAGNOSIS — M19041 Primary osteoarthritis, right hand: Secondary | ICD-10-CM | POA: Diagnosis not present

## 2022-12-08 DIAGNOSIS — M19011 Primary osteoarthritis, right shoulder: Secondary | ICD-10-CM | POA: Diagnosis not present

## 2022-12-08 DIAGNOSIS — M5030 Other cervical disc degeneration, unspecified cervical region: Secondary | ICD-10-CM | POA: Diagnosis not present

## 2022-12-13 ENCOUNTER — Other Ambulatory Visit (HOSPITAL_COMMUNITY): Payer: Self-pay | Admitting: Physician Assistant

## 2022-12-15 ENCOUNTER — Ambulatory Visit: Payer: Medicare HMO | Admitting: Occupational Therapy

## 2022-12-18 NOTE — Progress Notes (Signed)
Cardiology Office Note Date:  12/20/2022  Patient ID:  Charlene Coleman, Charlene Coleman 31-Mar-1941, MRN [id number] PCP:  Marisue Ivan, MD  Cardiologist:  Debbe Odea, MD Electrophysiologist: Lanier Prude, MD    Chief Complaint: afib follow-up  History of Present Illness: Charlene Coleman is a 82 y.o. female with PMH notable for parox AFib, HFpEF, CAD, HTN; seen today for Lanier Prude, MD for routine electrophysiology followup.  She is s/p AF ablation w PVI 09/2021, maintained on tikosyn Last saw Dr. Lalla Brothers 09/2022. She had a recent 1 hr long AF episode associated with increased BP. She was overall happy with level of control w tikosyn, so no changes made.   Today, she tells me that is not feeling as well as she was during last clinic appt. She gets more tired quicker than she used to, gets SOB with less activity than previous. Has to take more breaks when cleaning. Last night, she went to grandson's baseball game and had to stop several times walking from parking lot to the field.   Diligently takes eliquis and tikosyn BID (9a and 9p), no missed doses. Uses phone alarms to remember. No bleeding concerns. Good appetite, no swelling.    AAD History: Tikosyn BID  Past Medical History:  Diagnosis Date   (HFpEF) heart failure with preserved ejection fraction (HCC)    a. 06/2021 Echo: EF 60-65%, no rwma, mild asymm LVH of basal-septal segment. Nl RV fxn. RVSP 23.35mmHg. Mild MR. AoV sclerosis w/o stenosis.   Aortic valve sclerosis    a. 06/2021 Echo: Ao sclerosis w/o stenosis.   Borderline diabetes    Breast cancer (HCC) 2015   Left side - radiation - lumpectomy   CAD (coronary artery disease)    a. 12/2021 Cath: LM nl, LAD 25p/m, D1 25, RI nl, LCX nl, RCA 20/30p, 11m->Med rx.   DCIS (ductal carcinoma in situ)    Dyspnea    Dysrhythmia    Empty sella syndrome (HCC)    GERD (gastroesophageal reflux disease)    Gout    Hyperlipidemia    Hypertension    PAF (paroxysmal  atrial fibrillation) (HCC)    a. Amio stopped 2/2 thyroid dysfunction; b. 09/2021 s/p PVI.   Personal history of radiation therapy    Skin cancer 2011    Past Surgical History:  Procedure Laterality Date   ABDOMINAL HYSTERECTOMY     ATRIAL FIBRILLATION ABLATION N/A 09/21/2021   Procedure: ATRIAL FIBRILLATION ABLATION;  Surgeon: Lanier Prude, MD;  [facility];  Service: Cardiovascular;  Laterality: N/A;   BREAST BIOPSY Left 2015   positive   BREAST LUMPECTOMY Left 09/23/2013   CATARACT EXTRACTION Bilateral    CHOLECYSTECTOMY     ELECTROPHYSIOLOGIC STUDY     GASTRIC FUNDOPLICATION     JOINT REPLACEMENT Right 09/22/2020   R hip replacement   LEFT HEART CATH AND CORONARY ANGIOGRAPHY N/A 12/30/2021   Procedure: LEFT HEART CATH AND CORONARY ANGIOGRAPHY;  Surgeon: Yvonne Kendall, MD;  Location: [facility] INVAS[facility] CV LAB;  Service: Cardiovascular;  Laterality: N/A;   left lumpectomy      Current Outpatient Medications  Medication Instructions   allopurinol (ZYLOPRIM) 300 mg, Oral, Daily   apixaban (ELIQUIS) 5 mg, Oral, 2 times daily   atorvastatin (LIPITOR) 20 mg, Oral, Every evening   Calcium Carbonate-Vitamin D 600-400 MG-UNIT per tablet 1 tablet, Oral, 3 times weekly   carboxymethylcellulose (REFRESH PLUS) 0.5 % SOLN 1 drop, Both Eyes, Daily PRN  dofetilide (TIKOSYN) 250 mcg, Oral, 2 times daily   famotidine (PEPCID) 20 mg, Oral, Daily at bedtime   furosemide (LASIX) 20 mg, Oral, Daily, For swelling.   gabapentin (NEURONTIN) 300 mg, Oral, 2 times daily   irbesartan (AVAPRO) 300 mg, Oral, Daily   oxybutynin (DITROPAN-XL) 5 mg, Oral, Every morning   potassium chloride SA (KLOR-CON M) 20 MEQ tablet 20 mEq, Oral, Daily   Probiotic Product (ADVANCED PROBIOTIC PO) 1 tablet, Oral, Every morning    Social History:  The patient  reports that she has never smoked. She has never used smokeless tobacco. She reports that she does not drink alcohol and does not use drugs.    Family History:  The patient's family history includes Breast cancer in her cousin; Breast cancer (age of onset: 89) in her maternal aunt.  ROS:  Please see the history of present illness. All other systems are reviewed and otherwise negative.   PHYSICAL EXAM:   Vitals:   12/20/22 1042 12/20/22 1120  BP: (!) 150/72 (!) 142/70  Pulse: 73   Height: 5' (1.524 m)   Weight: 154 lb (69.9 kg)   SpO2: 98%   BMI (Calculated): 30.08     GEN- The patient is well appearing, alert and oriented x 3 today.   Lungs- Clear to ausculation bilaterally, normal work of breathing.  Heart- Irregularly irregular rate and rhythm, no murmurs, rubs or gallops Extremities- No peripheral edema, warm, dry   EKG is ordered. Personal review of EKG from today shows:  2:1, 3:1 Aflutter, ?atypical; rate 73   Recent Labs: 06/15/2022: TSH 3.325 09/21/2022: Magnesium 2.1 12/20/2022: BUN 30; Creatinine, Ser 1.07; Hemoglobin 12.8; Platelets 253; Potassium 4.0; Sodium 136  06/15/2022: Cholesterol 170; HDL 67; LDL Cholesterol 86; Total CHOL/HDL Ratio 2.5; Triglycerides 83; VLDL 17   Estimated Creatinine Clearance: 36.6 mL/min (A) (by C-G formula based on SCr of 1.07 mg/dL (H)).   Wt Readings from Last 3 Encounters:  12/20/22 154 lb (69.9 kg)  09/21/22 157 lb 3.2 oz (71.3 kg)  04/25/22 154 lb 3.2 oz (69.9 kg)     Additional studies reviewed include: Previous EP, cardiology notes.   Long term monitor, 07/02/2022 HR 34 - 156, average 57 bpm. 4 nonsustained SVT, longest 14.2 seconds with an average rate of 97 bpm.  Rhythm strip suggests AT. Rare supraventricular and ventricular ectopy. No sustained arrhythmias. No atrial fibrillation detected.  TTE, 06/29/2021  1. Left ventricular ejection fraction, by estimation, is 60 to 65%. The left ventricle has normal function. The left ventricle has no regional wall motion abnormalities. There is mild asymmetric left ventricular hypertrophy of the basal-septal segment.  Left ventricular diastolic parameters were normal.   2. Right ventricular systolic function is normal. The right ventricular size is normal. There is normal pulmonary artery systolic pressure. The estimated right ventricular systolic pressure is 23.4 mmHg.   3. LA index 26 ml/m2 - normal (however diameter 4.3 cm on parasternal long).   4. The mitral valve is normal in structure. Mild mitral valve regurgitation. No evidence of mitral stenosis.   5. The aortic valve is normal in structure. There is moderate calcification of the aortic valve. There is mild thickening of the aortic valve. Aortic valve regurgitation is not visualized. Aortic valve sclerosis is present, with no evidence of aortic valve stenosis.   6. The inferior vena cava is normal in size with greater than 50% respiratory variability, suggesting right atrial pressure of 3 mmHg.    ASSESSMENT AND PLAN:  #)  parox AFib #) Aflutter More symptomatic burden as of late Tolerating tikosyn 250mg  BID well, stable QTc Ventricular rate controlled DCCV in near future  - pre-procedure labs today Discussed with Dr. Lalla Brothers, who recommends ablation procedure to confirm isolation of PV, plus posterior wall. OK to schedule procedure - I attempted to call patient to discuss this, was unable to reach her   #) Hypercoag d/t AFib CHA2DS2-VASc Score = 6 [CHF History: 1, HTN History: 1, Diabetes History: 0, Stroke History: 0, Vascular Disease History: 1, Age Score: 2, Gender Score: 1].  Therefore, the patient's annual risk of stroke is 9.7 %. NOAC - eliquis 5mg  BID, appropriately dosed No bleeding concerns      Current medicines are reviewed at length with the patient today.   The patient does not have concerns regarding her medicines.  The following changes were made today:  none  Labs/ tests ordered today include:  Orders Placed This Encounter  Procedures   Basic Metabolic Panel (BMET)   CBC   EKG 12-Lead     Disposition: Follow up with  EP APP in  2-4 wks after DCCV     Signed, Sherie Don, NP  12/20/22  12:47 PM  Electrophysiology [facility]

## 2022-12-20 ENCOUNTER — Telehealth: Payer: Self-pay

## 2022-12-20 ENCOUNTER — Ambulatory Visit: Payer: Medicare HMO | Attending: Cardiology | Admitting: Cardiology

## 2022-12-20 ENCOUNTER — Other Ambulatory Visit
Admission: RE | Admit: 2022-12-20 | Discharge: 2022-12-20 | Disposition: A | Discharge status: Home / Self Care | Payer: Medicare HMO | Attending: Cardiology | Admitting: Cardiology

## 2022-12-20 ENCOUNTER — Encounter: Payer: Self-pay | Admitting: Cardiology

## 2022-12-20 VITALS — BP 142/70 | HR 73 | Ht 60.0 in | Wt 154.0 lb

## 2022-12-20 DIAGNOSIS — I480 Paroxysmal atrial fibrillation: Secondary | ICD-10-CM

## 2022-12-20 DIAGNOSIS — I4892 Unspecified atrial flutter: Secondary | ICD-10-CM

## 2022-12-20 DIAGNOSIS — D6869 Other thrombophilia: Secondary | ICD-10-CM | POA: Diagnosis not present

## 2022-12-20 LAB — CBC
HCT: 38.9 % (ref 36.0–46.0)
Hemoglobin: 12.8 g/dL (ref 12.0–15.0)
MCH: 31.2 pg (ref 26.0–34.0)
MCHC: 32.9 g/dL (ref 30.0–36.0)
MCV: 94.9 fL (ref 80.0–100.0)
Platelets: 253 10*3/uL (ref 150–400)
RBC: 4.1 MIL/uL (ref 3.87–5.11)
RDW: 14.6 % (ref 11.5–15.5)
WBC: 10.9 10*3/uL — ABNORMAL HIGH (ref 4.0–10.5)
nRBC: 0 % (ref 0.0–0.2)

## 2022-12-20 LAB — BASIC METABOLIC PANEL
Anion gap: 10 (ref 5–15)
BUN: 30 mg/dL — ABNORMAL HIGH (ref 8–23)
CO2: 21 mmol/L — ABNORMAL LOW (ref 22–32)
Calcium: 9.2 mg/dL (ref 8.9–10.3)
Chloride: 105 mmol/L (ref 98–111)
Creatinine, Ser: 1.07 mg/dL — ABNORMAL HIGH (ref 0.44–1.00)
GFR, Estimated: 53 mL/min — ABNORMAL LOW (ref >60)
Glucose, Bld: 86 mg/dL (ref 70–99)
Potassium: 4 mmol/L (ref 3.5–5.1)
Sodium: 136 mmol/L (ref 135–145)

## 2022-12-20 MED ORDER — DOFETILIDE 250 MCG PO CAPS: 250.0000 ug | ORAL_CAPSULE | Freq: Two times a day (BID) | ORAL | 0 refills | Status: DC

## 2022-12-20 NOTE — Telephone Encounter (Signed)
Pt was seen by Sherie Don, NP in [place] today..  Per Luella Cook, pt needs to be scheduled on 8/20 @ 7:30 AM for repeat AF Ablation. She will need a 4 week appt with Dr. Lalla Brothers prior to her procedure.   Pt states that she will look at her appt on mychart so no need to call her.   Her appt with Dr. Lalla Brothers has been scheduled for 7/31 and she can get her labs done at that time.

## 2022-12-20 NOTE — Patient Instructions (Addendum)
Medication Instructions:  Your physician recommends that you continue on your current medications as directed. Please refer to the Current Medication list given to you today.  *If you need a refill on your cardiac medications before your next appointment, please call your pharmacy*   Lab Work: BMP and CBC - Please go to the [facility]. You will check in at the front desk to the right as you walk into the atrium. Valet Parking is offered if needed. - No appointment needed. You may go any day between 7 am and 6 pm.  If you have labs (blood work) drawn today and your tests are completely normal, you will receive your results only by: MyChart Message (if you have MyChart) OR A paper copy in the mail If you have any lab test that is abnormal or we need to change your treatment, we will call you to review the results.   Testing/Procedures: You are scheduled for a Cardioversion on 12/28/2022 with Dr. Azucena Cecil.    Please arrive at the Heart & Vascular Center Entrance of [facility], [address], [place] [address] at 6:30 am (This is 1 hour prior to your procedure time).  Proceed to the Check-In Desk directly inside the entrance.  Procedure Parking: Use the entrance off of the [address] side of the hospital. Turn right upon entering and follow the driveway to parking that is directly in front of the Heart & Vascular Center. There is no valet parking available at this entrance, however there is an awning directly in front of the Heart & Vascular Center for drop off/ pick up for patients  DIET: Nothing to eat or drink after midnight except a sip of water with medications (see medication instructions below)  Medication Instructions: Hold furosemide until after procedure Continue your anticoagulant: Eliquis If you miss a dose, please call us at [phone] You will need to continue your anticoagulant after your procedure until you are told by your provider that it is safe to stop.    FYI: For your safety, and to allow [place] to monitor your vital signs accurately during the surgery/procedure we request that if you have artificial nails, gel coating, SNS etc. Please have those removed prior to your surgery/procedure. Not having the nail coverings /polish removed may result in cancellation or delay of your surgery/procedure.  You must have a responsible person to drive you home and stay in the waiting area during your procedure. Failure to do so could result in cancellation.  Bring your insurance cards.  If you have any questions after you get home, please call the office at [phone]  *Special Note: Every effort is made to have your procedure done on time. Occasionally there are emergencies that occur at the hospital that may cause delays. Please be patient if a delay does occur.        Follow-Up: At [facility], you and your health needs are our priority.  As part of our continuing mission to provide you with exceptional heart care, we have created designated Provider Care Teams.  These Care Teams include your primary Cardiologist (physician) and Advanced Practice Providers (APPs -  Physician Assistants and Nurse Practitioners) who all work together to provide you with the care you need, when you need it.  We recommend signing up for the patient portal called "MyChart".  Sign up information is provided on this After Visit Summary.  MyChart is used to connect with patients for Virtual Visits (Telemedicine).  Patients are  able to view lab/test results, encounter notes, upcoming appointments, etc.  Non-urgent messages can be sent to your provider as well.   To learn more about what you can do with MyChart, go to [email or web address].    Your next appointment:   2-4 week(s) after cardioversion  Provider:   Sherie Don, NP

## 2022-12-28 ENCOUNTER — Encounter: Payer: Self-pay | Admitting: Cardiology

## 2022-12-28 ENCOUNTER — Ambulatory Visit
Admission: RE | Admit: 2022-12-28 | Discharge: 2022-12-28 | Disposition: A | Discharge status: Home / Self Care | Payer: Medicare HMO | Attending: Cardiology | Admitting: Cardiology

## 2022-12-28 ENCOUNTER — Encounter: Payer: Self-pay | Admitting: Anesthesiology

## 2022-12-28 ENCOUNTER — Encounter
Admission: RE | Disposition: A | Discharge status: Home / Self Care | Payer: Self-pay | Source: Home / Self Care | Attending: Cardiology

## 2022-12-28 DIAGNOSIS — I4891 Unspecified atrial fibrillation: Secondary | ICD-10-CM | POA: Diagnosis not present

## 2022-12-28 DIAGNOSIS — Z01818 Encounter for other preprocedural examination: Secondary | ICD-10-CM | POA: Diagnosis not present

## 2022-12-28 DIAGNOSIS — Z539 Procedure and treatment not carried out, unspecified reason: Secondary | ICD-10-CM | POA: Diagnosis not present

## 2022-12-28 DIAGNOSIS — I480 Paroxysmal atrial fibrillation: Secondary | ICD-10-CM

## 2022-12-28 HISTORY — PX: CARDIOVERSION: SHX1299

## 2022-12-28 SURGERY — CARDIOVERSION
Anesthesia: General

## 2022-12-28 MED ORDER — PROPOFOL 1000 MG/100ML IV EMUL
INTRAVENOUS | Status: AC
  Filled 2022-12-28: qty 100

## 2022-12-28 MED ORDER — EPHEDRINE 5 MG/ML INJ
INTRAVENOUS | Status: AC
  Filled 2022-12-28: qty 5

## 2022-12-28 MED ORDER — SUCCINYLCHOLINE CHLORIDE 200 MG/10ML IV SOSY
PREFILLED_SYRINGE | INTRAVENOUS | Status: AC
  Filled 2022-12-28: qty 10

## 2022-12-28 MED ORDER — SODIUM CHLORIDE 0.9 % IV SOLN: INTRAVENOUS | Status: DC

## 2022-12-28 MED ORDER — ESMOLOL HCL 100 MG/10ML IV SOLN
INTRAVENOUS | Status: AC
  Filled 2022-12-28: qty 10

## 2022-12-28 MED ORDER — ATROPINE SULFATE 1 MG/10ML IJ SOSY
PREFILLED_SYRINGE | INTRAMUSCULAR | Status: AC
  Filled 2022-12-28: qty 10

## 2022-12-28 MED ORDER — PHENYLEPHRINE 80 MCG/ML (10ML) SYRINGE FOR IV PUSH (FOR BLOOD PRESSURE SUPPORT)
PREFILLED_SYRINGE | INTRAVENOUS | Status: AC
  Filled 2022-12-28: qty 20

## 2022-12-28 MED ORDER — EPINEPHRINE 1 MG/10ML IJ SOSY
PREFILLED_SYRINGE | INTRAMUSCULAR | Status: AC
  Filled 2022-12-28: qty 10

## 2022-12-28 MED ORDER — ROCURONIUM BROMIDE 10 MG/ML (PF) SYRINGE
PREFILLED_SYRINGE | INTRAVENOUS | Status: AC
  Filled 2022-12-28: qty 10

## 2022-12-28 NOTE — H&P (View-Only) (Signed)
Dr. Azucena Cecil in at bedside, speaking with pt. . Pt. Currently in SB. Cardioversion cancelled for today. Pt. Agreeable & will be Dc'd home.

## 2022-12-28 NOTE — Progress Notes (Signed)
Dr. Agbor-etang in at bedside, speaking with pt. . Pt. Currently in SB. Cardioversion cancelled for today. Pt. Agreeable & will be Dc'd home.  

## 2022-12-28 NOTE — Anesthesia Preprocedure Evaluation (Signed)
Anesthesia Evaluation    Airway Mallampati: III       Dental   Pulmonary           Cardiovascular hypertension, + CAD and +CHF  + dysrhythmias (a fib on Eliquis s/p ablation)  Rhythm:Irregular Rate:Normal  Myocardial perfusion 09/30/19:  1.  Normal left ventricular function  2.  Normal wall motion  3.  Mild lateral ischemia     Neuro/Psych    GI/Hepatic ,GERD  ,,  Endo/Other  Prediabetes   Renal/GU      Musculoskeletal  (+) Arthritis ,  Gout    Abdominal   Peds  Hematology Breast CA   Anesthesia Other Findings Cardiology note 12/20/22:  ASSESSMENT AND PLAN:   #) parox AFib #) Aflutter More symptomatic burden as of late Tolerating tikosyn 250mg  BID well, stable QTc Ventricular rate controlled DCCV in near future  - pre-procedure labs today Discussed with Dr. Lalla Brothers, who recommends ablation procedure to confirm isolation of PV, plus posterior wall. OK to schedule procedure - I attempted to call patient to discuss this, was unable to reach her     #) Hypercoag d/t AFib CHA2DS2-VASc Score = 6 [CHF History: 1, HTN History: 1, Diabetes History: 0, Stroke History: 0, Vascular Disease History: 1, Age Score: 2, Gender Score: 1].  Therefore, the patient's annual risk of stroke is 9.7 %. NOAC - eliquis 5mg  BID, appropriately dosed No bleeding concerns     Reproductive/Obstetrics                              Anesthesia Physical Anesthesia Plan  ASA: 3  Anesthesia Plan: General   Post-op Pain Management:    Induction: Intravenous  PONV Risk Score and Plan: 3 and Propofol infusion, TIVA and Treatment may vary due to age or medical condition  Airway Management Planned: Natural Airway  Additional Equipment:   Intra-op Plan:   Post-operative Plan:   Informed Consent: I have reviewed the patients History and Physical, chart, labs and discussed the procedure including the risks, benefits  and alternatives for the proposed anesthesia with the patient or authorized representative who has indicated his/her understanding and acceptance.       Plan Discussed with: CRNA  Anesthesia Plan Comments: (LMA/GETA backup discussed.  Patient consented for risks of anesthesia including but not limited to:  - adverse reactions to medications - damage to eyes, teeth, lips or other oral mucosa - nerve damage due to positioning  - sore throat or hoarseness - damage to heart, brain, nerves, lungs, other parts of body or loss of life  Informed patient about role of CRNA in peri- and intra-operative care.  Patient voiced understanding.)         Anesthesia Quick Evaluation

## 2022-12-28 NOTE — Interval H&P Note (Signed)
History and Physical Interval Note:  12/28/2022 7:26 AM  Charlene Coleman  has presented today for surgery, with the diagnosis of afib/aflutter.  The various methods of treatment have been discussed with the patient and family. After consideration of risks, benefits and other options for treatment, the patient has consented to  Procedure(s): CARDIOVERSION (N/A) as a surgical intervention.  The patient's history has been reviewed, patient examined, no change in status, stable for surgery.  I have reviewed the patient's chart and labs.  Questions were answered to the patient's satisfaction.    ekg obtained today showed sinus bradycardia. DC cardioversion not performed.  Had episode of fluttering last evening.  Patient advised to keep appointment with EP for further recommendations.   Arlys John Agbor-Etang

## 2022-12-29 ENCOUNTER — Encounter: Payer: Self-pay | Admitting: Cardiology

## 2023-01-02 DIAGNOSIS — D3132 Benign neoplasm of left choroid: Secondary | ICD-10-CM | POA: Diagnosis not present

## 2023-01-02 DIAGNOSIS — H35033 Hypertensive retinopathy, bilateral: Secondary | ICD-10-CM | POA: Diagnosis not present

## 2023-01-02 DIAGNOSIS — I10 Essential (primary) hypertension: Secondary | ICD-10-CM | POA: Diagnosis not present

## 2023-01-02 DIAGNOSIS — H524 Presbyopia: Secondary | ICD-10-CM | POA: Diagnosis not present

## 2023-01-02 DIAGNOSIS — H26493 Other secondary cataract, bilateral: Secondary | ICD-10-CM | POA: Diagnosis not present

## 2023-01-02 DIAGNOSIS — Z0100 Encounter for examination of eyes and vision without abnormal findings: Secondary | ICD-10-CM | POA: Diagnosis not present

## 2023-01-02 DIAGNOSIS — H16223 Keratoconjunctivitis sicca, not specified as Sjogren's, bilateral: Secondary | ICD-10-CM | POA: Diagnosis not present

## 2023-01-09 NOTE — Progress Notes (Unsigned)
Cardiology Office Note Date:  01/10/2023  Patient ID:  Charlene Coleman, Charlene Coleman 12-08-1940, MRN [id number] PCP:  Marisue Ivan, MD  Cardiologist:  Debbe Odea, MD Electrophysiologist: Lanier Prude, MD    Chief Complaint: afib follow-up  History of Present Illness: Charlene Coleman is a 82 y.o. female with PMH notable for parox AFib, HFpEF, CAD, HTN; seen today for Lanier Prude, MD for routine electrophysiology followup.  She is s/p AF ablation w PVI 09/2021, maintained on tikosyn I saw her about 3 weeks ago when she was in A-fib.  She also described increased A-fib burden at home.  She was set up for cardioversion at next available and preemptively scheduled for repeat ablation with Dr. Lalla Brothers.  When she presented for her cardioversion, she was noted to be in sinus rhythm.   Today, she continues to complain of paroxysmal A-fib episodes, most last between 35 to 45 minutes but can last up to 1 hour.  She continues to have fatigue throughout the day.  States that she has to force herself to get up and be active.  She states she gets very poor sleep at night, does not feel rested in the morning.  Her husband states that she is snoring more than she used to. Continues to take Tikosyn twice daily and Eliquis twice daily, no missed doses.  No bleeding concerns on Eliquis.   AAD History: Tikosyn BID  Past Medical History:  Diagnosis Date   (HFpEF) heart failure with preserved ejection fraction (HCC)    a. 06/2021 Echo: EF 60-65%, no rwma, mild asymm LVH of basal-septal segment. Nl RV fxn. RVSP 23.22mmHg. Mild MR. AoV sclerosis w/o stenosis.   Aortic valve sclerosis    a. 06/2021 Echo: Ao sclerosis w/o stenosis.   Borderline diabetes    Breast cancer (HCC) 2015   Left side - radiation - lumpectomy   CAD (coronary artery disease)    a. 12/2021 Cath: LM nl, LAD 25p/m, D1 25, RI nl, LCX nl, RCA 20/30p, 54m->Med rx.   DCIS (ductal carcinoma in situ)    Dyspnea    Dysrhythmia     Empty sella syndrome (HCC)    GERD (gastroesophageal reflux disease)    Gout    Hyperlipidemia    Hypertension    PAF (paroxysmal atrial fibrillation) (HCC)    a. Amio stopped 2/2 thyroid dysfunction; b. 09/2021 s/p PVI.   Personal history of radiation therapy    Skin cancer 2011    Past Surgical History:  Procedure Laterality Date   ABDOMINAL HYSTERECTOMY     ATRIAL FIBRILLATION ABLATION N/A 09/21/2021   Procedure: ATRIAL FIBRILLATION ABLATION;  Surgeon: Lanier Prude, MD;  Location: [facility];  Service: Cardiovascular;  Laterality: N/A;   BREAST BIOPSY Left 2015   positive   BREAST LUMPECTOMY Left 09/23/2013   CARDIOVERSION N/A 12/28/2022   Procedure: CARDIOVERSION;  Surgeon: Debbe Odea, MD;  Location: [facility] ORS;  Service: Cardiovascular;  Laterality: N/A;   CATARACT EXTRACTION Bilateral    CHOLECYSTECTOMY     ELECTROPHYSIOLOGIC STUDY     GASTRIC FUNDOPLICATION     JOINT REPLACEMENT Right 09/22/2020   R hip replacement   LEFT HEART CATH AND CORONARY ANGIOGRAPHY N/A 12/30/2021   Procedure: LEFT HEART CATH AND CORONARY ANGIOGRAPHY;  Surgeon: Yvonne Kendall, MD;  Location: [facility] INVASIVE CV LAB;  Service: Cardiovascular;  Laterality: N/A;   left lumpectomy      Current Outpatient Medications  Medication Instructions   allopurinol (ZYLOPRIM)  300 mg, Oral, Daily   apixaban (ELIQUIS) 5 mg, Oral, 2 times daily   atorvastatin (LIPITOR) 20 mg, Oral, Every evening   Calcium Carbonate-Vitamin D 600-400 MG-UNIT per tablet 1 tablet, Oral, 3 times weekly   carboxymethylcellulose (REFRESH PLUS) 0.5 % SOLN 1 drop, Both Eyes, Daily PRN   dofetilide (TIKOSYN) 250 mcg, Oral, 2 times daily   famotidine (PEPCID) 20 mg, Oral, Daily at bedtime   furosemide (LASIX) 20 mg, Oral, Daily, For swelling.   gabapentin (NEURONTIN) 300 mg, Oral, 2 times daily   irbesartan (AVAPRO) 300 mg, Oral, Daily   oxybutynin (DITROPAN-XL) 5 mg, Oral, Every morning   potassium chloride SA  (KLOR-CON M) 20 MEQ tablet 20 mEq, Oral, Daily   Probiotic Product (ADVANCED PROBIOTIC PO) 1 tablet, Oral, Every morning    Social History:  The patient  reports that she has never smoked. She has never used smokeless tobacco. She reports that she does not drink alcohol and does not use drugs.   Family History:  The patient's family history includes Breast cancer in her cousin; Breast cancer (age of onset: 76) in her maternal aunt.  ROS:  Please see the history of present illness. All other systems are reviewed and otherwise negative.   PHYSICAL EXAM:   Vitals:   01/10/23 1039  BP: (!) 144/76  Pulse: (!) 54  Height: 5' (1.524 m)  Weight: 155 lb (70.3 kg)  SpO2: 97%  BMI (Calculated): 30.27     GEN- The patient is well appearing, alert and oriented x 3 today.   Lungs- Clear to ausculation bilaterally, normal work of breathing.  Heart- Regular rate and rhythm, no murmurs, rubs or gallops Extremities- No peripheral edema, warm, dry   EKG is ordered. Personal review of EKG from today shows: SB, 54bpm QT 440 QTC 425   Recent Labs: 06/15/2022: TSH 3.325 09/21/2022: Magnesium 2.1 12/20/2022: BUN 30; Creatinine, Ser 1.07; Hemoglobin 12.8; Platelets 253; Potassium 4.0; Sodium 136  06/15/2022: Cholesterol 170; HDL 67; LDL Cholesterol 86; Total CHOL/HDL Ratio 2.5; Triglycerides 83; VLDL 17   Estimated Creatinine Clearance: 36.7 mL/min (A) (by C-G formula based on SCr of 1.07 mg/dL (H)).   Wt Readings from Last 3 Encounters:  01/10/23 155 lb (70.3 kg)  12/28/22 154 lb (69.9 kg)  12/20/22 154 lb (69.9 kg)     Additional studies reviewed include: Previous EP, cardiology notes.   Long term monitor, 07/02/2022 HR 34 - 156, average 57 bpm. 4 nonsustained SVT, longest 14.2 seconds with an average rate of 97 bpm.  Rhythm strip suggests AT. Rare supraventricular and ventricular ectopy. No sustained arrhythmias. No atrial fibrillation detected.  TTE, 06/29/2021  1. Left  ventricular ejection fraction, by estimation, is 60 to 65%. The left ventricle has normal function. The left ventricle has no regional wall motion abnormalities. There is mild asymmetric left ventricular hypertrophy of the basal-septal segment. Left ventricular diastolic parameters were normal.   2. Right ventricular systolic function is normal. The right ventricular size is normal. There is normal pulmonary artery systolic pressure. The estimated right ventricular systolic pressure is 23.4 mmHg.   3. LA index 26 ml/m2 - normal (however diameter 4.3 cm on parasternal long).   4. The mitral valve is normal in structure. Mild mitral valve regurgitation. No evidence of mitral stenosis.   5. The aortic valve is normal in structure. There is moderate calcification of the aortic valve. There is mild thickening of the aortic valve. Aortic valve regurgitation is not visualized. Aortic valve  sclerosis is present, with no evidence of aortic valve stenosis.   6. The inferior vena cava is normal in size with greater than 50% respiratory variability, suggesting right atrial pressure of 3 mmHg.    ASSESSMENT AND PLAN:  #) parox AFib #) Aflutter Continues to have increased symptomatic burden. She does state that that paroxysmal A-fib episodes are shorter than they used to be prior to her ablation She is interested in pursuing repeat ablation, this has been scheduled with Dr. Lalla Brothers   #) Hypercoag d/t AFib CHA2DS2-VASc Score = 6 [CHF History: 1, HTN History: 1, Diabetes History: 0, Stroke History: 0, Vascular Disease History: 1, Age Score: 2, Gender Score: 1].  Therefore, the patient's annual risk of stroke is 9.7 %. NOAC - eliquis 5mg  BID, appropriately dosed No bleeding concerns     #) daytime fatigue Likely related to increased arrhythmia burden Will update echo Recommend WatchPAT for further OSA evaluation STOP-BANG score 4   Current medicines are reviewed at length with the patient today.   The  patient does not have concerns regarding her medicines.  The following changes were made today:  none  Labs/ tests ordered today include:  Orders Placed This Encounter  Procedures   EKG 12-Lead   ECHOCARDIOGRAM COMPLETE     Disposition: Follow up with Dr. Lalla Brothers in  as scheduled     Signed, Sherie Don, NP  01/10/23  12:05 PM  Electrophysiology [facility]

## 2023-01-10 ENCOUNTER — Ambulatory Visit: Payer: Medicare HMO | Attending: Cardiology | Admitting: Cardiology

## 2023-01-10 ENCOUNTER — Encounter: Payer: Self-pay | Admitting: Cardiology

## 2023-01-10 ENCOUNTER — Telehealth: Payer: Self-pay | Admitting: *Deleted

## 2023-01-10 VITALS — BP 144/76 | HR 54 | Ht 60.0 in | Wt 155.0 lb

## 2023-01-10 DIAGNOSIS — R400 Somnolence: Secondary | ICD-10-CM

## 2023-01-10 DIAGNOSIS — I480 Paroxysmal atrial fibrillation: Secondary | ICD-10-CM

## 2023-01-10 DIAGNOSIS — R0602 Shortness of breath: Secondary | ICD-10-CM | POA: Diagnosis not present

## 2023-01-10 DIAGNOSIS — Z79899 Other long term (current) drug therapy: Secondary | ICD-10-CM | POA: Diagnosis not present

## 2023-01-10 DIAGNOSIS — R5383 Other fatigue: Secondary | ICD-10-CM

## 2023-01-10 NOTE — Addendum Note (Signed)
Addended by: Sherie Don on: 01/10/2023 01:59 PM   Modules accepted: Orders

## 2023-01-10 NOTE — Telephone Encounter (Signed)
Prior Authorization for [employer] sent to [employer] via web portal. Tracking Number . NO PA REQ 

## 2023-01-10 NOTE — Telephone Encounter (Signed)
Spoke with patient and informed her she may open the [employer] and start using it this evening-no PA required. PIN number given to patient and husband. Patient expressed verbal understanding.

## 2023-01-10 NOTE — Addendum Note (Signed)
Addended by: Thayer Headings, Giuseppe Duchemin L on: 01/10/2023 01:54 PM   Modules accepted: Orders

## 2023-01-10 NOTE — Patient Instructions (Addendum)
Medication Instructions:  Your physician recommends that you continue on your current medications as directed. Please refer to the Current Medication list given to you today.  *If you need a refill on your cardiac medications before your next appointment, please call your pharmacy*   Lab Work: No labs ordered  If you have labs (blood work) drawn today and your tests are completely normal, you will receive your results only by: MyChart Message (if you have MyChart) OR A paper copy in the mail If you have any lab test that is abnormal or we need to change your treatment, we will call you to review the results.   Testing/Procedures: Your physician has requested that you have an echocardiogram. Echocardiography is a painless test that uses sound waves to create images of your heart. It provides your doctor with information about the size and shape of your heart and how well your heart's chambers and valves are working. This procedure takes approximately one hour. There are no restrictions for this procedure. Please do NOT wear cologne, perfume, aftershave, or lotions (deodorant is allowed). Please arrive 15 minutes prior to your appointment time.  WatchPAT?  Is a FDA cleared portable home sleep study test that uses a watch and 3 points of contact to monitor 7 different channels, including your heart rate, oxygen saturations, body position, snoring, and chest motion.  The study is easy to use from the comfort of your own home and accurately detect sleep apnea.  Before bed, you attach the chest sensor, attached the sleep apnea bracelet to your nondominant hand, and attach the finger probe.  After the study, the raw data is downloaded from the watch and scored for apnea events.   For more information: [email or web address]   Follow-Up: At [facility], you and your health needs are our priority.  As part of our continuing mission to provide you with exceptional heart  care, we have created designated Provider Care Teams.  These Care Teams include your primary Cardiologist (physician) and Advanced Practice Providers (APPs -  Physician Assistants and Nurse Practitioners) who all work together to provide you with the care you need, when you need it.  We recommend signing up for the patient portal called "MyChart".  Sign up information is provided on this After Visit Summary.  MyChart is used to connect with patients for Virtual Visits (Telemedicine).  Patients are able to view lab/test results, encounter notes, upcoming appointments, etc.  Non-urgent messages can be sent to your provider as well.   To learn more about what you can do with MyChart, go to [email or web address].    Your next appointment:   03/01/23 @ 10:20 AM  Provider:   Steffanie Dunn, MD

## 2023-01-11 ENCOUNTER — Encounter (INDEPENDENT_AMBULATORY_CARE_PROVIDER_SITE_OTHER): Payer: Medicare HMO | Admitting: Cardiology

## 2023-01-11 DIAGNOSIS — G4719 Other hypersomnia: Secondary | ICD-10-CM

## 2023-01-11 DIAGNOSIS — R0683 Snoring: Secondary | ICD-10-CM | POA: Diagnosis not present

## 2023-01-11 DIAGNOSIS — G4710 Hypersomnia, unspecified: Secondary | ICD-10-CM | POA: Diagnosis not present

## 2023-01-11 NOTE — Telephone Encounter (Signed)
Prior Authorization for [facility] sent to [facility] via web portal. Tracking Number .  READY NO PQ REQ

## 2023-01-13 NOTE — Procedures (Signed)
     SLEEP STUDY REPORT Patient Information Study Date: 01/11/2023 Patient Name: Charlene Coleman Patient ID: [id number] Birth Date: October 06, 1940 Age: 82 Gender: Female BMI: 30.3 (W=154 lb, H=5' 0'') Referring Physician: Sherie Don, NP  TEST DESCRIPTION: Home sleep apnea testing was completed using the WatchPat, a Type 1 device, utilizing peripheral arterial tonometry (PAT), chest movement, actigraphy, pulse oximetry, pulse rate, body position and snore. AHI was calculated with apnea and hypopnea using valid sleep time as the denominator. RDI includes apneas, hypopneas, and RERAs. The data acquired and the scoring of sleep and all associated events were performed in accordance with the recommended standards and specifications as outlined in the AASM Manual for the Scoring of Sleep and Associated Events 2.2.0 (2015).   FINDINGS: 1.  No evidence of Obstructive Sleep Apnea with AHI 4.3/hr.  2.  No Central Sleep Apnea. 3.  Oxygen desaturations as low as 87%. 4.  Mild snoring was present. O2 sats were < 88% for 0.4 minutes. 5.  Total sleep time was 6 hrs and 30 min. 6.  16.7% of total sleep time was spent in REM sleep.  7.  Prolonged REM sleep onset latency at 292 min.  9.  Total awakenings were 4.   DIAGNOSIS:  Normal study with no significant sleep disordered breathing.  RECOMMENDATIONS:   1. Normal study with no significant sleep disordered breathing.  2.  Healthy sleep recommendations include:  adequate nightly sleep (normal 7-9 hrs/night), avoidance of caffeine after noon and alcohol near bedtime, and maintaining a sleep environment that is cool, dark and quiet.  3.  Weight loss for overweight patients is recommended.    4.  Snoring recommendations include:  weight loss where appropriate, side sleeping, and avoidance of alcohol before bed.  5.  Operation of motor vehicle or dangerous equipment must be avoided when feeling drowsy, excessively sleepy, or mentally fatigued.    6.   An ENT consultation which may be useful for specific causes of and possible treatment of bothersome snoring.   7. Weight loss may be of benefit in reducing the severity of snoring.   Signature:   Armanda Magic, MD; [facility]; Diplomat, American Board of Sleep Medicine Electronically Signed: 01/13/2023 10:06:20 AM

## 2023-01-16 ENCOUNTER — Ambulatory Visit: Payer: Medicare HMO | Attending: Cardiology

## 2023-01-16 DIAGNOSIS — R400 Somnolence: Secondary | ICD-10-CM

## 2023-01-16 DIAGNOSIS — R5383 Other fatigue: Secondary | ICD-10-CM

## 2023-01-19 ENCOUNTER — Ambulatory Visit: Payer: Medicare HMO | Attending: Cardiology

## 2023-01-19 DIAGNOSIS — R0602 Shortness of breath: Secondary | ICD-10-CM

## 2023-01-19 LAB — ECHOCARDIOGRAM COMPLETE
Area-P 1/2: 3.85 cm2
S' Lateral: 2.5 cm

## 2023-01-19 MED ORDER — PERFLUTREN LIPID MICROSPHERE
1.0000 mL | INTRAVENOUS | Status: AC | PRN
Start: 2023-01-19 — End: 2023-01-19
  Administered 2023-01-19: 2 mL via INTRAVENOUS

## 2023-01-24 DIAGNOSIS — E039 Hypothyroidism, unspecified: Secondary | ICD-10-CM | POA: Diagnosis not present

## 2023-01-24 DIAGNOSIS — E782 Mixed hyperlipidemia: Secondary | ICD-10-CM | POA: Diagnosis not present

## 2023-01-24 DIAGNOSIS — I10 Essential (primary) hypertension: Secondary | ICD-10-CM | POA: Diagnosis not present

## 2023-01-31 ENCOUNTER — Encounter: Payer: Self-pay | Admitting: Cardiology

## 2023-01-31 DIAGNOSIS — E782 Mixed hyperlipidemia: Secondary | ICD-10-CM | POA: Diagnosis not present

## 2023-01-31 DIAGNOSIS — I4891 Unspecified atrial fibrillation: Secondary | ICD-10-CM | POA: Diagnosis not present

## 2023-01-31 DIAGNOSIS — I10 Essential (primary) hypertension: Secondary | ICD-10-CM | POA: Diagnosis not present

## 2023-02-03 DIAGNOSIS — M1712 Unilateral primary osteoarthritis, left knee: Secondary | ICD-10-CM | POA: Diagnosis not present

## 2023-02-03 DIAGNOSIS — M25562 Pain in left knee: Secondary | ICD-10-CM | POA: Diagnosis not present

## 2023-02-07 ENCOUNTER — Other Ambulatory Visit: Payer: Self-pay

## 2023-02-07 MED ORDER — FUROSEMIDE 20 MG PO TABS: 20.0000 mg | ORAL_TABLET | Freq: Every day | ORAL | 0 refills | Status: DC

## 2023-02-09 ENCOUNTER — Telehealth: Payer: Self-pay

## 2023-02-09 NOTE — Telephone Encounter (Signed)
Notified patient of sleep study results and recommendations. All questoins (if any) were answered. Patient verbalized understanding.

## 2023-02-09 NOTE — Telephone Encounter (Signed)
-----   Message from Armanda Magic sent at 01/13/2023 10:07 AM EDT ----- Please let patient know that sleep study showed no significant sleep apnea.

## 2023-02-28 NOTE — Progress Notes (Unsigned)
Electrophysiology Office Follow up Visit Note:    Date:  03/01/2023   ID:  Charlene Coleman, DOB [birth year], MRN [id number]  PCP:  Marisue Ivan, MD  [facility] Cardiologist:  Debbe Odea, MD  [facility] Electrophysiologist:  Lanier Prude, MD    Interval History:    Charlene Coleman is a 82 y.o. female who presents for a follow up visit.   Last seen January 10, 2023 by Luella Cook.  She had a prior A-fib ablation February 2023 is maintained on Tikosyn.  At the appointment on June 11, she reported continued episodes of paroxysmal atrial fibrillation lasting up to an hour.  She has fatigue when having atrial fibrillation episodes.  She continues to take Tikosyn and Eliquis.  September 21, 2021 catheter ablation included pulmonary vein isolation only.  Today she is doing well.  She describes venous insufficiency during our appointment today.  Swelling is worse by the end of the day and improves by the morning.  Worse in the heat.  She uses as needed Lasix.  Continues to take Eliquis without bleeding issues.  Is taking Tikosyn without missed doses.     Past medical, surgical, social and family history were reviewed.  ROS:   Please see the history of present illness.    All other systems reviewed and are negative.  EKGs/Labs/Other Studies Reviewed:    The following studies were reviewed today:    EKG Interpretation Date/Time:  Wednesday March 01 2023 10:13:56 EDT Ventricular Rate:  53 PR Interval:  194 QRS Duration:  74 QT Interval:  456 QTC Calculation: 427 R Axis:   15  Text Interpretation: Sinus bradycardia with Premature atrial complexes Confirmed by Steffanie Dunn [phone]) on 03/01/2023 10:26:22 AM    Physical Exam:    VS:  BP 126/70 (BP Location: Left Arm, Patient Position: Sitting, Cuff Size: Normal)   Pulse (!) 53   Ht 5' (1.524 m)   Wt 157 lb 12.8 oz (71.6 kg)   SpO2 98%   BMI 30.82 kg/m     Wt Readings from Last 3 Encounters:  03/01/23 157 lb 12.8  oz (71.6 kg)  01/10/23 155 lb (70.3 kg)  12/28/22 154 lb (69.9 kg)     GEN:  Well nourished, well developed in no acute distress CARDIAC: RRR, no murmurs, rubs, gallops RESPIRATORY:  Clear to auscultation without rales, wheezing or rhonchi       ASSESSMENT:    1. Encounter for long-term (current) use of high-risk medication   2. Persistent atrial fibrillation (HCC)   3. Primary hypertension    PLAN:    In order of problems listed above:  #Symptomatic atrial fibrillation #High risk drug monitoring-dofetilide Continues to have symptomatic episodes of atrial fibrillation despite treatment with Tikosyn and after a catheter ablation in 2023.  She is currently scheduled for repeat ablation. Continue Eliquis for stroke prophylaxis  Ablation strategy will be touchup PVI plus posterior wall.  QTc acceptable for ongoing Tikosyn use on today's EKG.  Discussed treatment options today for AF including antiarrhythmic drug therapy and ablation. Discussed risks, recovery and likelihood of success with each treatment strategy. Risk, benefits, and alternatives to EP study and ablation for afib were discussed. These risks include but are not limited to stroke, bleeding, vascular damage, tamponade, perforation, damage to the esophagus, lungs, phrenic nerve and other structures, pulmonary vein stenosis, worsening renal function, coronary vasospasm and death.  Discussed potential need for repeat ablation procedures and antiarrhythmic drugs after an initial ablation. The  patient understands these risk and wishes to proceed.  We will therefore proceed with catheter ablation at the next available time.  Carto, ICE, anesthesia are requested for the procedure.  Will also obtain CT PV protocol prior to the procedure to exclude LAA thrombus and further evaluate atrial anatomy.  #Hypertension At goal today.  Recommend checking blood pressures 1-2 times per week at home and recording the values.  Recommend  bringing these recordings to the primary care physician.   Signed, Steffanie Dunn, MD, [facility], [facility] 03/01/2023 10:30 AM    Electrophysiology [facility]

## 2023-02-28 NOTE — H&P (View-Only) (Signed)
Electrophysiology Office Follow up Visit Note:    Date:  03/01/2023   ID:  Charlene Coleman, DOB [birth year], MRN [id number]  PCP:  Marisue Ivan, MD  [facility] Cardiologist:  Debbe Odea, MD  [facility] Electrophysiologist:  Lanier Prude, MD    Interval History:    Charlene Coleman is a 82 y.o. female who presents for a follow up visit.   Last seen January 10, 2023 by Luella Cook.  She had a prior A-fib ablation February 2023 is maintained on Tikosyn.  At the appointment on June 11, she reported continued episodes of paroxysmal atrial fibrillation lasting up to an hour.  She has fatigue when having atrial fibrillation episodes.  She continues to take Tikosyn and Eliquis.  September 21, 2021 catheter ablation included pulmonary vein isolation only.  Today she is doing well.  She describes venous insufficiency during our appointment today.  Swelling is worse by the end of the day and improves by the morning.  Worse in the heat.  She uses as needed Lasix.  Continues to take Eliquis without bleeding issues.  Is taking Tikosyn without missed doses.     Past medical, surgical, social and family history were reviewed.  ROS:   Please see the history of present illness.    All other systems reviewed and are negative.  EKGs/Labs/Other Studies Reviewed:    The following studies were reviewed today:    EKG Interpretation Date/Time:  Wednesday March 01 2023 10:13:56 EDT Ventricular Rate:  53 PR Interval:  194 QRS Duration:  74 QT Interval:  456 QTC Calculation: 427 R Axis:   15  Text Interpretation: Sinus bradycardia with Premature atrial complexes Confirmed by Steffanie Dunn [id number]) on 03/01/2023 10:26:22 AM    Physical Exam:    VS:  BP 126/70 (BP Location: Left Arm, Patient Position: Sitting, Cuff Size: Normal)   Pulse (!) 53   Ht 5' (1.524 m)   Wt 157 lb 12.8 oz (71.6 kg)   SpO2 98%   BMI 30.82 kg/m     Wt Readings from Last 3 Encounters:  03/01/23 157 lb 12.8  oz (71.6 kg)  01/10/23 155 lb (70.3 kg)  12/28/22 154 lb (69.9 kg)     GEN:  Well nourished, well developed in no acute distress CARDIAC: RRR, no murmurs, rubs, gallops RESPIRATORY:  Clear to auscultation without rales, wheezing or rhonchi       ASSESSMENT:    1. Encounter for long-term (current) use of high-risk medication   2. Persistent atrial fibrillation (HCC)   3. Primary hypertension    PLAN:    In order of problems listed above:  #Symptomatic atrial fibrillation #High risk drug monitoring-dofetilide Continues to have symptomatic episodes of atrial fibrillation despite treatment with Tikosyn and after a catheter ablation in 2023.  She is currently scheduled for repeat ablation. Continue Eliquis for stroke prophylaxis  Ablation strategy will be touchup PVI plus posterior wall.  QTc acceptable for ongoing Tikosyn use on today's EKG.  Discussed treatment options today for AF including antiarrhythmic drug therapy and ablation. Discussed risks, recovery and likelihood of success with each treatment strategy. Risk, benefits, and alternatives to EP study and ablation for afib were discussed. These risks include but are not limited to stroke, bleeding, vascular damage, tamponade, perforation, damage to the esophagus, lungs, phrenic nerve and other structures, pulmonary vein stenosis, worsening renal function, coronary vasospasm and death.  Discussed potential need for repeat ablation procedures and antiarrhythmic drugs after an initial ablation. The  patient understands these risk and wishes to proceed.  We will therefore proceed with catheter ablation at the next available time.  Carto, ICE, anesthesia are requested for the procedure.  Will also obtain CT PV protocol prior to the procedure to exclude LAA thrombus and further evaluate atrial anatomy.  #Hypertension At goal today.  Recommend checking blood pressures 1-2 times per week at home and recording the values.  Recommend  bringing these recordings to the primary care physician.   Signed, Steffanie Dunn, MD, [facility], [facility] 03/01/2023 10:30 AM    Electrophysiology [facility]

## 2023-03-01 ENCOUNTER — Ambulatory Visit: Payer: Medicare HMO | Attending: Cardiology | Admitting: Cardiology

## 2023-03-01 ENCOUNTER — Encounter: Payer: Self-pay | Admitting: Cardiology

## 2023-03-01 VITALS — BP 126/70 | HR 53 | Ht 60.0 in | Wt 157.8 lb

## 2023-03-01 DIAGNOSIS — I10 Essential (primary) hypertension: Secondary | ICD-10-CM

## 2023-03-01 DIAGNOSIS — Z79899 Other long term (current) drug therapy: Secondary | ICD-10-CM | POA: Diagnosis not present

## 2023-03-01 DIAGNOSIS — I4819 Other persistent atrial fibrillation: Secondary | ICD-10-CM

## 2023-03-01 NOTE — Patient Instructions (Signed)
Medication Instructions:  Your physician recommends that you continue on your current medications as directed. Please refer to the Current Medication list given to you today.  *If you need a refill on your cardiac medications before your next appointment, please call your pharmacy*  Lab Work: TODAY: CBC and BMET   If you have labs (blood work) drawn today and your tests are completely normal, you will receive your results only by: MyChart Message (if you have MyChart) OR A paper copy in the mail If you have any lab test that is abnormal or we need to change your treatment, we will call you to review the results.  Follow-Up: At [facility], you and your health needs are our priority.  As part of our continuing mission to provide you with exceptional heart care, we have created designated Provider Care Teams.  These Care Teams include your primary Cardiologist (physician) and Advanced Practice Providers (APPs -  Physician Assistants and Nurse Practitioners) who all work together to provide you with the care you need, when you need it.  Your next appointment:   As scheduled

## 2023-03-14 ENCOUNTER — Ambulatory Visit (HOSPITAL_COMMUNITY)
Admission: RE | Admit: 2023-03-14 | Discharge: 2023-03-14 | Disposition: A | Discharge status: Home / Self Care | Payer: Medicare HMO | Source: Ambulatory Visit | Attending: Cardiology | Admitting: Cardiology

## 2023-03-14 DIAGNOSIS — I480 Paroxysmal atrial fibrillation: Secondary | ICD-10-CM | POA: Diagnosis not present

## 2023-03-14 MED ORDER — IOHEXOL 350 MG/ML SOLN
100.0000 mL | Freq: Once | INTRAVENOUS | Status: AC | PRN
  Administered 2023-03-14: 100 mL via INTRAVENOUS

## 2023-03-20 NOTE — Anesthesia Preprocedure Evaluation (Addendum)
Anesthesia Evaluation  Patient identified by MRN, date of birth, ID band Patient awake    Reviewed: Allergy & Precautions, NPO status , Patient's Chart, lab work & pertinent test results  History of Anesthesia Complications Negative for: history of anesthetic complications  Airway Mallampati: III  TM Distance: >3 FB Neck ROM: Full   Comment: Previous grade I view with MAC blade Dental  (+) Dental Advisory Given   Pulmonary neg pulmonary ROS   Pulmonary exam normal breath sounds clear to auscultation       Cardiovascular hypertension (irbesartan), Pt. on medications (-) angina + CAD (non-obstructive) and +CHF (EF 60-65%)  (-) Past MI and (-) Cardiac Stents + dysrhythmias Atrial Fibrillation + Valvular Problems/Murmurs (mild-to-moderate) MR  Rhythm:Regular Rate:Normal  HLD  TTE 01/19/2023: IMPRESSIONS     1. Left ventricular ejection fraction, by estimation, is 60 to 65%. The  left ventricle has normal function. The left ventricle has no regional  wall motion abnormalities. Left ventricular diastolic parameters are  indeterminate.   2. Right ventricular systolic function is normal. The right ventricular  size is normal.   3. Left atrial size was moderately dilated.   4. The mitral valve is normal in structure. Mild to moderate mitral valve  regurgitation. No evidence of mitral stenosis.   5. The aortic valve is normal in structure. Aortic valve regurgitation is  not visualized. Aortic valve sclerosis is present, with no evidence of  aortic valve stenosis.   6. The inferior vena cava is normal in size with greater than 50%  respiratory variability, suggesting right atrial pressure of 3 mmHg.   LHC 12/30/2021: Conclusions: 1. Mild-moderate, nonobstructive coronary artery disease, including 20-30% proximal/mid LAD and D1 lesions as well as sequential 20-50% proximal and mid RCA stenoses. 2. Hyperdynamic left ventricular  contraction (LVEF greater than 65%) with normal filling pressure (LVEDP 10-15 mmHg). 3. Paroxysmal atrial fibrillation with rapid ventricular response that initiated when aortic valve was crossed with pigtail catheter.  Patient received IV metoprolol with improved rate control.    Neuro/Psych neg Seizures Empty sella syndrome    GI/Hepatic Neg liver ROS,GERD  Medicated,,  Endo/Other  negative endocrine ROS    Renal/GU negative Renal ROS     Musculoskeletal  (+) Arthritis ,    Abdominal   Peds  Hematology negative hematology ROS (+) Lab Results      Component                Value               Date                      WBC                      5.5                 03/01/2023                HGB                      11.7                03/01/2023                HCT                      35.8  03/01/2023                MCV                      95                  03/01/2023                PLT                      224                 03/01/2023              Anesthesia Other Findings Last Eliquis: last night  Reproductive/Obstetrics H/o breast cancer                             Anesthesia Physical Anesthesia Plan  ASA: 3  Anesthesia Plan: General   Post-op Pain Management:    Induction: Intravenous  PONV Risk Score and Plan: 3 and Ondansetron, Dexamethasone and Treatment may vary due to age or medical condition  Airway Management Planned: Oral ETT  Additional Equipment:   Intra-op Plan:   Post-operative Plan: Extubation in OR  Informed Consent: I have reviewed the patients History and Physical, chart, labs and discussed the procedure including the risks, benefits and alternatives for the proposed anesthesia with the patient or authorized representative who has indicated his/her understanding and acceptance.     Dental advisory given  Plan Discussed with: Anesthesiologist and CRNA  Anesthesia Plan Comments: (Risks of  general anesthesia discussed including, but not limited to, sore throat, hoarse voice, chipped/damaged teeth, injury to vocal cords, nausea and vomiting, allergic reactions, lung infection, heart attack, stroke, and death. All questions answered. )        Anesthesia Quick Evaluation

## 2023-03-20 NOTE — Pre-Procedure Instructions (Signed)
Instructed patient on the following items: Arrival time 0515 Nothing to eat or drink after midnight No meds AM of procedure Responsible person to drive you home and stay with you for 24 hrs  Have you missed any doses of anti-coagulant Eliquis- takes twice a day, hasn't missed any doses.  Don't take dose in the morning.

## 2023-03-21 ENCOUNTER — Encounter (HOSPITAL_COMMUNITY)
Admission: RE | Disposition: A | Discharge status: Home / Self Care | Payer: Self-pay | Source: Home / Self Care | Attending: Cardiology

## 2023-03-21 ENCOUNTER — Ambulatory Visit (HOSPITAL_COMMUNITY): Payer: Self-pay | Admitting: Anesthesiology

## 2023-03-21 ENCOUNTER — Other Ambulatory Visit: Payer: Self-pay

## 2023-03-21 ENCOUNTER — Ambulatory Visit (HOSPITAL_COMMUNITY)
Admission: RE | Admit: 2023-03-21 | Discharge: 2023-03-21 | Disposition: A | Discharge status: Home / Self Care | Payer: Medicare HMO | Attending: Cardiology | Admitting: Cardiology

## 2023-03-21 ENCOUNTER — Other Ambulatory Visit (HOSPITAL_COMMUNITY): Payer: Self-pay

## 2023-03-21 ENCOUNTER — Ambulatory Visit (HOSPITAL_BASED_OUTPATIENT_CLINIC_OR_DEPARTMENT_OTHER): Payer: Medicare HMO | Admitting: Anesthesiology

## 2023-03-21 DIAGNOSIS — I509 Heart failure, unspecified: Secondary | ICD-10-CM

## 2023-03-21 DIAGNOSIS — Z7901 Long term (current) use of anticoagulants: Secondary | ICD-10-CM | POA: Diagnosis not present

## 2023-03-21 DIAGNOSIS — I4819 Other persistent atrial fibrillation: Secondary | ICD-10-CM | POA: Diagnosis not present

## 2023-03-21 DIAGNOSIS — I4891 Unspecified atrial fibrillation: Secondary | ICD-10-CM

## 2023-03-21 DIAGNOSIS — I10 Essential (primary) hypertension: Secondary | ICD-10-CM | POA: Insufficient documentation

## 2023-03-21 DIAGNOSIS — E782 Mixed hyperlipidemia: Secondary | ICD-10-CM

## 2023-03-21 DIAGNOSIS — I480 Paroxysmal atrial fibrillation: Secondary | ICD-10-CM

## 2023-03-21 DIAGNOSIS — I110 Hypertensive heart disease with heart failure: Secondary | ICD-10-CM

## 2023-03-21 HISTORY — PX: ATRIAL FIBRILLATION ABLATION: EP1191

## 2023-03-21 LAB — GLUCOSE, CAPILLARY
Glucose-Capillary: 84 mg/dL (ref 70–99)
Glucose-Capillary: 85 mg/dL (ref 70–99)

## 2023-03-21 SURGERY — ATRIAL FIBRILLATION ABLATION
Anesthesia: General

## 2023-03-21 MED ORDER — COLCHICINE 0.6 MG PO TABS
0.6000 mg | ORAL_TABLET | Freq: Two times a day (BID) | ORAL | Status: DC
  Administered 2023-03-21: 0.6 mg via ORAL
  Filled 2023-03-21: qty 1

## 2023-03-21 MED ORDER — SODIUM CHLORIDE 0.9% FLUSH: 3.0000 mL | INTRAVENOUS | Status: DC | PRN

## 2023-03-21 MED ORDER — SODIUM CHLORIDE 0.9 % IV SOLN: 250.0000 mL | INTRAVENOUS | Status: DC | PRN

## 2023-03-21 MED ORDER — ROCURONIUM BROMIDE 10 MG/ML (PF) SYRINGE
PREFILLED_SYRINGE | INTRAVENOUS | Status: DC | PRN
  Administered 2023-03-21: 50 mg via INTRAVENOUS

## 2023-03-21 MED ORDER — SUGAMMADEX SODIUM 200 MG/2ML IV SOLN
INTRAVENOUS | Status: DC | PRN
  Administered 2023-03-21: 200 mg via INTRAVENOUS

## 2023-03-21 MED ORDER — HEPARIN (PORCINE) IN NACL 1000-0.9 UT/500ML-% IV SOLN
INTRAVENOUS | Status: DC | PRN
  Administered 2023-03-21 (×3): 500 mL

## 2023-03-21 MED ORDER — SODIUM CHLORIDE 0.9% FLUSH: 3.0000 mL | Freq: Two times a day (BID) | INTRAVENOUS | Status: DC

## 2023-03-21 MED ORDER — COLCHICINE 0.6 MG PO TABS
0.6000 mg | ORAL_TABLET | Freq: Two times a day (BID) | ORAL | 0 refills | Status: DC
  Filled 2023-03-21: qty 10, 5d supply, fill #0

## 2023-03-21 MED ORDER — PANTOPRAZOLE SODIUM 40 MG PO TBEC
40.0000 mg | DELAYED_RELEASE_TABLET | Freq: Every day | ORAL | Status: DC
  Administered 2023-03-21: 40 mg via ORAL
  Filled 2023-03-21: qty 1

## 2023-03-21 MED ORDER — HEPARIN SODIUM (PORCINE) 1000 UNIT/ML IJ SOLN
INTRAMUSCULAR | Status: DC | PRN
  Administered 2023-03-21: 1000 [IU] via INTRAVENOUS

## 2023-03-21 MED ORDER — PROTAMINE SULFATE 10 MG/ML IV SOLN
INTRAVENOUS | Status: DC | PRN
  Administered 2023-03-21: 10 mg via INTRAVENOUS
  Administered 2023-03-21: 15 mg via INTRAVENOUS
  Administered 2023-03-21: 10 mg via INTRAVENOUS

## 2023-03-21 MED ORDER — ONDANSETRON HCL 4 MG/2ML IJ SOLN
INTRAMUSCULAR | Status: DC | PRN
Start: 2023-03-21 — End: 2023-03-21
  Administered 2023-03-21: 4 mg via INTRAVENOUS

## 2023-03-21 MED ORDER — ONDANSETRON HCL 4 MG/2ML IJ SOLN: 4.0000 mg | Freq: Four times a day (QID) | INTRAMUSCULAR | Status: DC | PRN

## 2023-03-21 MED ORDER — HEPARIN SODIUM (PORCINE) 1000 UNIT/ML IJ SOLN
INTRAMUSCULAR | Status: DC | PRN
  Administered 2023-03-21: 11000 [IU] via INTRAVENOUS

## 2023-03-21 MED ORDER — LIDOCAINE 2% (20 MG/ML) 5 ML SYRINGE
INTRAMUSCULAR | Status: DC | PRN
  Administered 2023-03-21: 80 mg via INTRAVENOUS

## 2023-03-21 MED ORDER — PANTOPRAZOLE SODIUM 40 MG PO TBEC
40.0000 mg | DELAYED_RELEASE_TABLET | Freq: Every day | ORAL | 0 refills | Status: DC
  Filled 2023-03-21: qty 45, 45d supply, fill #0

## 2023-03-21 MED ORDER — PHENYLEPHRINE HCL-NACL 20-0.9 MG/250ML-% IV SOLN
INTRAVENOUS | Status: DC | PRN
  Administered 2023-03-21: 25 ug/min via INTRAVENOUS

## 2023-03-21 MED ORDER — HEPARIN SODIUM (PORCINE) 1000 UNIT/ML IJ SOLN
INTRAMUSCULAR | Status: AC
  Filled 2023-03-21: qty 10

## 2023-03-21 MED ORDER — APIXABAN 5 MG PO TABS
5.0000 mg | ORAL_TABLET | Freq: Two times a day (BID) | ORAL | Status: DC
  Administered 2023-03-21: 5 mg via ORAL
  Filled 2023-03-21: qty 1

## 2023-03-21 MED ORDER — SODIUM CHLORIDE 0.9 % IV SOLN: INTRAVENOUS | Status: DC

## 2023-03-21 MED ORDER — PROPOFOL 10 MG/ML IV BOLUS
INTRAVENOUS | Status: DC | PRN
  Administered 2023-03-21: 100 mg via INTRAVENOUS

## 2023-03-21 SURGICAL SUPPLY — 19 items
BLANKET WARM UNDERBOD FULL ACC (MISCELLANEOUS) ×1 IMPLANT
CATH 8FR REPROCESSED SOUNDSTAR (CATHETERS) ×1 IMPLANT
CATH 8FR SOUNDSTAR REPROCESSED (CATHETERS) IMPLANT
CATH ABLAT QDOT MICRO BI TC DF (CATHETERS) IMPLANT
CATH OCTARAY 2.0 F 3-3-3-3-3 (CATHETERS) IMPLANT
CATH S-M CIRCA TEMP PROBE (CATHETERS) IMPLANT
CATH WEBSTER BI DIR CS D-F CRV (CATHETERS) IMPLANT
CLOSURE PERCLOSE PROSTYLE (VASCULAR PRODUCTS) IMPLANT
COVER SWIFTLINK CONNECTOR (BAG) ×1 IMPLANT
PACK EP LATEX FREE (CUSTOM PROCEDURE TRAY) ×1
PACK EP LF (CUSTOM PROCEDURE TRAY) ×1 IMPLANT
PAD DEFIB RADIO PHYSIO CONN (PAD) ×1 IMPLANT
PATCH CARTO3 (PAD) IMPLANT
SHEATH BAYLIS TRANSSEPTAL 98CM (NEEDLE) IMPLANT
SHEATH CARTO VIZIGO SM CVD (SHEATH) IMPLANT
SHEATH PINNACLE 8F 10CM (SHEATH) IMPLANT
SHEATH PINNACLE 9F 10CM (SHEATH) IMPLANT
SHEATH PROBE COVER 6X72 (BAG) IMPLANT
TUBING SMART ABLATE COOLFLOW (TUBING) IMPLANT

## 2023-03-21 NOTE — Interval H&P Note (Signed)
History and Physical Interval Note:  03/21/2023 6:46 AM  Charlene Coleman  has presented today for surgery, with the diagnosis of afib.  The various methods of treatment have been discussed with the patient and family. After consideration of risks, benefits and other options for treatment, the patient has consented to  Procedure(s): ATRIAL FIBRILLATION ABLATION (N/A) as a surgical intervention.  The patient's history has been reviewed, patient examined, no change in status, stable for surgery.  I have reviewed the patient's chart and labs.  Questions were answered to the patient's satisfaction.     Tavian Callander T Laquon Emel

## 2023-03-21 NOTE — Transfer of Care (Signed)
Immediate Anesthesia Transfer of Care Note  Patient: Charlene Coleman  Procedure(s) Performed: ATRIAL FIBRILLATION ABLATION  Patient Location: Cath Lab  Anesthesia Type:General  Level of Consciousness: awake, alert , patient cooperative, and responds to stimulation  Airway & Oxygen Therapy: Patient Spontanous Breathing and Patient connected to nasal cannula oxygen  Post-op Assessment: Report given to RN and Post -op Vital signs reviewed and stable  Post vital signs: Reviewed and stable  Last Vitals:  Vitals Value Taken Time  BP 169/65 03/21/23 0910  Temp    Pulse 60 03/21/23 0912  Resp 30 03/21/23 0912  SpO2 97 % 03/21/23 0912  Vitals shown include unfiled device data.  Last Pain:  Vitals:   03/21/23 0601  TempSrc:   PainSc: 0-No pain         Complications: There were no known notable events for this encounter.

## 2023-03-21 NOTE — Discharge Instructions (Signed)

## 2023-03-21 NOTE — Anesthesia Postprocedure Evaluation (Signed)
Anesthesia Post Note  Patient: Charlene Coleman  Procedure(s) Performed: ATRIAL FIBRILLATION ABLATION     Patient location during evaluation: PACU Anesthesia Type: General Level of consciousness: awake Pain management: pain level controlled Vital Signs Assessment: post-procedure vital signs reviewed and stable Respiratory status: spontaneous breathing, nonlabored ventilation and respiratory function stable Cardiovascular status: blood pressure returned to baseline and stable Postop Assessment: no apparent nausea or vomiting Anesthetic complications: no   There were no known notable events for this encounter.  Last Vitals:  Vitals:   03/21/23 0943 03/21/23 0945  BP: 134/78 139/68  Pulse:  (!) 54  Resp:  12  Temp:    SpO2:  96%    Last Pain:  Vitals:   03/21/23 0945  TempSrc:   PainSc: 0-No pain                 Linton Rump

## 2023-03-21 NOTE — Anesthesia Procedure Notes (Signed)
Procedure Name: Intubation Date/Time: 03/21/2023 7:43 AM  Performed by: Margarita Rana, CRNAPre-anesthesia Checklist: Patient identified, Patient being monitored, Timeout performed, Emergency Drugs available and Suction available Patient Re-evaluated:Patient Re-evaluated prior to induction Oxygen Delivery Method: Circle System Utilized Preoxygenation: Pre-oxygenation with 100% oxygen Induction Type: IV induction Ventilation: Mask ventilation without difficulty Laryngoscope Size: Mac and 3 Grade View: Grade I Tube type: Oral Tube size: 7.0 mm Number of attempts: 1 Airway Equipment and Method: Stylet Placement Confirmation: ETT inserted through vocal cords under direct vision, positive ETCO2 and breath sounds checked- equal and bilateral Secured at: 22 cm Tube secured with: Tape Dental Injury: Teeth and Oropharynx as per pre-operative assessment

## 2023-03-22 ENCOUNTER — Encounter (HOSPITAL_COMMUNITY): Payer: Self-pay | Admitting: Cardiology

## 2023-03-22 LAB — POCT ACTIVATED CLOTTING TIME: Activated Clotting Time: 354 s

## 2023-03-24 ENCOUNTER — Other Ambulatory Visit: Payer: Self-pay | Admitting: Cardiology

## 2023-03-27 ENCOUNTER — Other Ambulatory Visit: Payer: Self-pay

## 2023-03-27 MED ORDER — FUROSEMIDE 20 MG PO TABS: 20.0000 mg | ORAL_TABLET | Freq: Every day | ORAL | 0 refills | Status: DC

## 2023-03-27 NOTE — Telephone Encounter (Addendum)
last visit with Dr. Azucena Cecil on 04/25/22 with plan to f/u in 12 months. Please schedule f/u appt.  thanks

## 2023-03-27 NOTE — Telephone Encounter (Signed)
Requested Prescriptions   Signed Prescriptions Disp Refills   furosemide (LASIX) 20 MG tablet 90 tablet 0    Sig: Take 1 tablet (20 mg total) by mouth daily. For swelling. PLEASE SCHEDULE OFFICE VISIT FOR FURTHER REFILLS. THANK YOU!    Authorizing Provider: Debbe Odea    Ordering User: Guerry Minors

## 2023-03-28 ENCOUNTER — Other Ambulatory Visit: Payer: Self-pay

## 2023-03-28 DIAGNOSIS — I480 Paroxysmal atrial fibrillation: Secondary | ICD-10-CM

## 2023-03-28 MED ORDER — APIXABAN 5 MG PO TABS
5.0000 mg | ORAL_TABLET | Freq: Two times a day (BID) | ORAL | 1 refills | Status: DC
Start: 2023-03-28 — End: 2023-06-28

## 2023-03-28 NOTE — Telephone Encounter (Signed)
Prescription refill request for Eliquis received. Indication: Afib  Last office visit: 03/01/23 Lalla Brothers)  Scr: 0.79 (03/01/23)  Age: 82 Weight: 68.9kg  Appropriate dose. Refill sent.

## 2023-03-29 NOTE — Telephone Encounter (Signed)
Scheduled 10/16

## 2023-04-17 DIAGNOSIS — M1712 Unilateral primary osteoarthritis, left knee: Secondary | ICD-10-CM | POA: Diagnosis not present

## 2023-04-18 ENCOUNTER — Ambulatory Visit (HOSPITAL_COMMUNITY)
Admission: RE | Admit: 2023-04-18 | Discharge: 2023-04-18 | Disposition: A | Discharge status: Home / Self Care | Payer: Medicare HMO | Source: Ambulatory Visit | Attending: Physician Assistant | Admitting: Physician Assistant

## 2023-04-18 ENCOUNTER — Encounter (HOSPITAL_COMMUNITY): Payer: Self-pay | Admitting: Physician Assistant

## 2023-04-18 VITALS — BP 144/86 | HR 48 | Ht 60.0 in | Wt 157.2 lb

## 2023-04-18 DIAGNOSIS — Z923 Personal history of irradiation: Secondary | ICD-10-CM | POA: Diagnosis not present

## 2023-04-18 DIAGNOSIS — Z79899 Other long term (current) drug therapy: Secondary | ICD-10-CM

## 2023-04-18 DIAGNOSIS — Z5181 Encounter for therapeutic drug level monitoring: Secondary | ICD-10-CM

## 2023-04-18 DIAGNOSIS — I480 Paroxysmal atrial fibrillation: Secondary | ICD-10-CM

## 2023-04-18 DIAGNOSIS — I5032 Chronic diastolic (congestive) heart failure: Secondary | ICD-10-CM | POA: Diagnosis not present

## 2023-04-18 DIAGNOSIS — D6869 Other thrombophilia: Secondary | ICD-10-CM

## 2023-04-18 DIAGNOSIS — I110 Hypertensive heart disease with heart failure: Secondary | ICD-10-CM | POA: Diagnosis not present

## 2023-04-18 DIAGNOSIS — I2510 Atherosclerotic heart disease of native coronary artery without angina pectoris: Secondary | ICD-10-CM | POA: Diagnosis not present

## 2023-04-18 DIAGNOSIS — Z7901 Long term (current) use of anticoagulants: Secondary | ICD-10-CM | POA: Insufficient documentation

## 2023-04-18 NOTE — Progress Notes (Signed)
Primary Care Physician: Marisue Ivan, MD Primary Cardiologist: Dr Azucena Cecil Primary Electrophysiologist: Dr Lalla Brothers Referring Physician: Dr Roberts Gaudy is a 82 y.o. female with a history of HTN, HLD, CAD, HFpEF, atrial fibrillation who presents for follow up in the [facility]. Patient is on Eliquis for a CHADS2VASC score of 6. She had been maintained on amiodarone but this was discontinued due to thyroid dysfunction. She underwent afib ablation with Dr Lalla Brothers on 09/21/21. She unfortunately continued to have symptomatic afib and was seen by Dr Lalla Brothers on 12/22/21 who recommended dofetilide. She was also having CP with her afib and had LHC on 12/30/21 which showed nonobstructive CAD. Patient is s/p dofetilide loading 6/26-6/29/23.   On follow up today, patient began having symptomatic breakthrough episodes of afib and underwent repeat ablation with Dr Lalla Brothers on 03/21/23. Patient reports that she has done well since the ablation with no more "fluttering" in her chest. She denies chest pain, swallowing pain, or groin issues.   Today, she denies symptoms of palpitations, shortness of breath, orthopnea, PND, lower extremity edema, dizziness, presyncope, syncope, snoring, daytime somnolence, bleeding, or neurologic sequela. The patient is tolerating medications without difficulties and is otherwise without complaint today.    Atrial Fibrillation Risk Factors:  she does not have symptoms or diagnosis of sleep apnea. she does not have a history of rheumatic fever.   Atrial Fibrillation Management history:  Previous antiarrhythmic drugs: amiodarone, dofetilide  Previous cardioversions: none Previous ablations: 09/21/21 Anticoagulation history: Eliquis    Past Medical History:  Diagnosis Date   (HFpEF) heart failure with preserved ejection fraction (HCC)    a. 06/2021 Echo: EF 60-65%, no rwma, mild asymm LVH of basal-septal segment. Nl RV fxn. RVSP  23.42mmHg. Mild MR. AoV sclerosis w/o stenosis.   Aortic valve sclerosis    a. 06/2021 Echo: Ao sclerosis w/o stenosis.   Borderline diabetes    Breast cancer (HCC) 2015   Left side - radiation - lumpectomy   CAD (coronary artery disease)    a. 12/2021 Cath: LM nl, LAD 25p/m, D1 25, RI nl, LCX nl, RCA 20/30p, 34m->Med rx.   DCIS (ductal carcinoma in situ)    Dyspnea    Dysrhythmia    Empty sella syndrome (HCC)    GERD (gastroesophageal reflux disease)    Gout    Hyperlipidemia    Hypertension    PAF (paroxysmal atrial fibrillation) (HCC)    a. Amio stopped 2/2 thyroid dysfunction; b. 09/2021 s/p PVI.   Personal history of radiation therapy    Skin cancer 2011    Current Outpatient Medications  Medication Sig Dispense Refill   allopurinol (ZYLOPRIM) 300 MG tablet Take 300 mg by mouth daily.      apixaban (ELIQUIS) 5 MG TABS tablet Take 1 tablet (5 mg total) by mouth 2 (two) times daily. 180 tablet 1   atorvastatin (LIPITOR) 20 MG tablet Take 20 mg by mouth every evening.     carboxymethylcellulose (REFRESH PLUS) 0.5 % SOLN Place 1 drop into both eyes daily as needed (dry eyes).     dofetilide (TIKOSYN) 250 MCG capsule TAKE 1 CAPSULE TWICE DAILY 180 capsule 0   famotidine (PEPCID) 20 MG tablet Take 20 mg by mouth at bedtime.     furosemide (LASIX) 20 MG tablet Take 1 tablet (20 mg total) by mouth daily. For swelling. PLEASE SCHEDULE OFFICE VISIT FOR FURTHER REFILLS. THANK YOU! 90 tablet 0   gabapentin (NEURONTIN) 300 MG capsule  Take 300 mg by mouth 2 (two) times daily at 10 AM and 5 PM.     irbesartan (AVAPRO) 300 MG tablet Take 1 tablet (300 mg total) by mouth daily. 90 tablet 3   methocarbamol (ROBAXIN) 500 MG tablet Take 500 mg by mouth daily.     oxybutynin (DITROPAN-XL) 5 MG 24 hr tablet Take 5 mg by mouth in the morning.     pantoprazole (PROTONIX) 40 MG tablet Take 1 tablet (40 mg total) by mouth daily. 45 tablet 0   potassium chloride SA (KLOR-CON M) 20 MEQ tablet Take 1 tablet  (20 mEq total) by mouth daily. 90 tablet 2   Probiotic Product (ADVANCED PROBIOTIC PO) Take 1 tablet by mouth every morning.     colchicine 0.6 MG tablet Take 1 tablet (0.6 mg total) by mouth 2 (two) times daily for 5 days. 10 tablet 0   No current facility-administered medications for this encounter.    ROS- All systems are reviewed and negative except as per the HPI above.  Physical Exam: Vitals:   04/18/23 0958  BP: (!) 144/86  Pulse: (!) 48  Weight: 71.3 kg  Height: 5' (1.524 m)    GEN: Well nourished, well developed in no acute distress NECK: No JVD; No carotid bruits CARDIAC: Regular rate and rhythm, no murmurs, rubs, gallops RESPIRATORY:  Clear to auscultation without rales, wheezing or rhonchi  ABDOMEN: Soft, non-tender, non-distended EXTREMITIES:  No edema; No deformity    Wt Readings from Last 3 Encounters:  04/18/23 71.3 kg  03/21/23 68.9 kg  03/01/23 71.6 kg    EKG today demonstrates  SB, anterior T wave inv similar to previous ECGs Vent. rate 48 BPM PR interval 192 ms QRS duration 76 ms QT/QTcB 486/434 ms  Echo 06/29/21 demonstrated   1. Left ventricular ejection fraction, by estimation, is 60 to 65%. The  left ventricle has normal function. The left ventricle has no regional  wall motion abnormalities. There is mild asymmetric left ventricular  hypertrophy of the basal-septal segment. Left ventricular diastolic parameters were normal.   2. Right ventricular systolic function is normal. The right ventricular  size is normal. There is normal pulmonary artery systolic pressure. The  estimated right ventricular systolic pressure is 23.4 mmHg.   3. LA index 26 ml/m2 - normal (however diameter 4.3 cm on parasternal long).   4. The mitral valve is normal in structure. Mild mitral valve  regurgitation. No evidence of mitral stenosis.   5. The aortic valve is normal in structure. There is moderate  calcification of the aortic valve. There is mild thickening of  the aortic  valve. Aortic valve regurgitation is not visualized. Aortic valve  sclerosis is present, with no evidence of aortic  valve stenosis.   6. The inferior vena cava is normal in size with greater than 50%  respiratory variability, suggesting right atrial pressure of 3 mmHg.   Epic records are reviewed at length today  CHA2DS2-VASc Score = 6  The patient's score is based upon: CHF History: 1 HTN History: 1 Diabetes History: 0 Stroke History: 0 Vascular Disease History: 1 Age Score: 2 Gender Score: 1       ASSESSMENT AND PLAN: Paroxysmal Atrial Fibrillation (ICD10:  I48.0) The patient's CHA2DS2-VASc score is 6, indicating a 9.7% annual risk of stroke.   S/p afib ablation 09/21/21 with repeat ablation 03/21/23 S/p dofetilide admission 6/26-6/29/23 Patient appears to be maintaining SR Continue dofetilide 250 mcg BID, QT stable Check bmet/mag today Continue Eliquis  5 mg BID with no missed doses for 3 months post ablation.   Secondary Hypercoagulable State (ICD10:  D68.69) The patient is at significant risk for stroke/thromboembolism based upon her CHA2DS2-VASc Score of 6.  Continue Apixaban (Eliquis).   CAD  Non obstructive No anginal symptoms  HTN Stable on current regimen  Chronic HFpEF Fluid status appears stable today   Follow up with Dr Lalla Brothers as scheduled.    Jorja Loa PA-C [facility] [address] [place], [place] [address] [phone] 04/18/2023 10:52 AM

## 2023-04-24 DIAGNOSIS — M1712 Unilateral primary osteoarthritis, left knee: Secondary | ICD-10-CM | POA: Diagnosis not present

## 2023-04-25 DIAGNOSIS — M19042 Primary osteoarthritis, left hand: Secondary | ICD-10-CM | POA: Diagnosis not present

## 2023-04-25 DIAGNOSIS — M5030 Other cervical disc degeneration, unspecified cervical region: Secondary | ICD-10-CM | POA: Diagnosis not present

## 2023-04-25 DIAGNOSIS — M19041 Primary osteoarthritis, right hand: Secondary | ICD-10-CM | POA: Diagnosis not present

## 2023-04-25 DIAGNOSIS — M7551 Bursitis of right shoulder: Secondary | ICD-10-CM | POA: Diagnosis not present

## 2023-05-01 DIAGNOSIS — M1712 Unilateral primary osteoarthritis, left knee: Secondary | ICD-10-CM | POA: Diagnosis not present

## 2023-05-16 DIAGNOSIS — L578 Other skin changes due to chronic exposure to nonionizing radiation: Secondary | ICD-10-CM | POA: Diagnosis not present

## 2023-05-16 DIAGNOSIS — L820 Inflamed seborrheic keratosis: Secondary | ICD-10-CM | POA: Diagnosis not present

## 2023-05-16 DIAGNOSIS — D225 Melanocytic nevi of trunk: Secondary | ICD-10-CM | POA: Diagnosis not present

## 2023-05-16 DIAGNOSIS — D169 Benign neoplasm of bone and articular cartilage, unspecified: Secondary | ICD-10-CM | POA: Diagnosis not present

## 2023-05-16 DIAGNOSIS — D2262 Melanocytic nevi of left upper limb, including shoulder: Secondary | ICD-10-CM | POA: Diagnosis not present

## 2023-05-16 DIAGNOSIS — L821 Other seborrheic keratosis: Secondary | ICD-10-CM | POA: Diagnosis not present

## 2023-05-16 DIAGNOSIS — Z85828 Personal history of other malignant neoplasm of skin: Secondary | ICD-10-CM | POA: Diagnosis not present

## 2023-05-16 DIAGNOSIS — L538 Other specified erythematous conditions: Secondary | ICD-10-CM | POA: Diagnosis not present

## 2023-05-16 DIAGNOSIS — X32XXXA Exposure to sunlight, initial encounter: Secondary | ICD-10-CM | POA: Diagnosis not present

## 2023-05-17 ENCOUNTER — Ambulatory Visit: Payer: Medicare HMO | Attending: Cardiology | Admitting: Cardiology

## 2023-05-17 ENCOUNTER — Encounter: Payer: Self-pay | Admitting: Cardiology

## 2023-05-17 VITALS — BP 138/78 | HR 51 | Ht 60.0 in | Wt 152.8 lb

## 2023-05-17 DIAGNOSIS — I10 Essential (primary) hypertension: Secondary | ICD-10-CM

## 2023-05-17 DIAGNOSIS — I5189 Other ill-defined heart diseases: Secondary | ICD-10-CM | POA: Diagnosis not present

## 2023-05-17 DIAGNOSIS — M109 Gout, unspecified: Secondary | ICD-10-CM | POA: Insufficient documentation

## 2023-05-17 DIAGNOSIS — I2510 Atherosclerotic heart disease of native coronary artery without angina pectoris: Secondary | ICD-10-CM

## 2023-05-17 DIAGNOSIS — I5030 Unspecified diastolic (congestive) heart failure: Secondary | ICD-10-CM | POA: Insufficient documentation

## 2023-05-17 DIAGNOSIS — I480 Paroxysmal atrial fibrillation: Secondary | ICD-10-CM

## 2023-05-17 DIAGNOSIS — E785 Hyperlipidemia, unspecified: Secondary | ICD-10-CM | POA: Insufficient documentation

## 2023-05-17 NOTE — Patient Instructions (Signed)
Medication Instructions:   Your physician recommends that you continue on your current medications as directed. Please refer to the Current Medication list given to you today.  *If you need a refill on your cardiac medications before your next appointment, please call your pharmacy*   Lab Work:  None Ordered  If you have labs (blood work) drawn today and your tests are completely normal, you will receive your results only by: MyChart Message (if you have MyChart) OR A paper copy in the mail If you have any lab test that is abnormal or we need to change your treatment, we will call you to review the results.   Testing/Procedures:  None Ordered    Follow-Up: At [facility], you and your health needs are our priority.  As part of our continuing mission to provide you with exceptional heart care, we have created designated Provider Care Teams.  These Care Teams include your primary Cardiologist (physician) and Advanced Practice Providers (APPs -  Physician Assistants and Nurse Practitioners) who all work together to provide you with the care you need, when you need it.  We recommend signing up for the patient portal called "MyChart".  Sign up information is provided on this After Visit Summary.  MyChart is used to connect with patients for Virtual Visits (Telemedicine).  Patients are able to view lab/test results, encounter notes, upcoming appointments, etc.  Non-urgent messages can be sent to your provider as well.   To learn more about what you can do with MyChart, go to [email or web address].    Your next appointment:   12 month(s)  Provider:   You may see Debbe Odea, MD or one of the following Advanced Practice Providers on your designated Care Team:   Nicolasa Ducking, NP Eula Listen, PA-C Cadence Fransico Michael, PA-C Charlsie Quest, NP

## 2023-05-17 NOTE — Progress Notes (Signed)
Cardiology Office Note:    Date:  05/17/2023   ID:  TZIREL MADIA, DOB March 14, 1941, MRN [id number]  PCP:  Marisue Ivan, MD  [facility] Cardiologist:  Debbe Odea, MD  [facility] Electrophysiologist:  Lanier Prude, MD   Referring MD: Marisue Ivan, MD   Chief Complaint  Patient presents with   Follow-up    Patient denies new or acute cardiac problems/concerns today.      History of Present Illness:    Charlene Coleman is a 82 y.o. female with a hx of paroxysmal atrial fibrillation s/p ablation x 2 (09/2018, 03/2023),  hypertension, hyperlipidemia, non obstructive CAD (LHC 6/23: 25% pLAD, 50% dRCA), HFpEF who presents for follow-up.   Patient had recurrent palpitations 2 months ago needing repeat ablation 03/2023.  Compliant with Eliquis, Tikosyn as prescribed.  Symptoms are now adequately controlled with rare palpitations.  Feels well, no concerns at this time.  No bleeding issues with taking Eliquis.   Prior notes LHC 6/23: 25% pLAD, 50% dRCA) Cardiac monitor in the past revealed atrial fibrillation with 12.68% burden.  LHC 2009 showed moderate stenosis 50% in D1, RCA 09/2007 .   Echo 03/2020 normal systolic function, grade 2 diastolic dysfunction, moderate pulmonary hypertension.  Past Medical History:  Diagnosis Date   (HFpEF) heart failure with preserved ejection fraction (HCC)    a. 06/2021 Echo: EF 60-65%, no rwma, mild asymm LVH of basal-septal segment. Nl RV fxn. RVSP 23.61mmHg. Mild MR. AoV sclerosis w/o stenosis.   Aortic valve sclerosis    a. 06/2021 Echo: Ao sclerosis w/o stenosis.   Borderline diabetes    Breast cancer (HCC) 2015   Left side - radiation - lumpectomy   CAD (coronary artery disease)    a. 12/2021 Cath: LM nl, LAD 25p/m, D1 25, RI nl, LCX nl, RCA 20/30p, 28m->Med rx.   DCIS (ductal carcinoma in situ)    Dyspnea    Dysrhythmia    Empty sella syndrome (HCC)    GERD (gastroesophageal reflux disease)    Gout    Hyperlipidemia     Hypertension    PAF (paroxysmal atrial fibrillation) (HCC)    a. Amio stopped 2/2 thyroid dysfunction; b. 09/2021 s/p PVI.   Personal history of radiation therapy    Skin cancer 2011    Past Surgical History:  Procedure Laterality Date   ABDOMINAL HYSTERECTOMY     ATRIAL FIBRILLATION ABLATION N/A 09/21/2021   Procedure: ATRIAL FIBRILLATION ABLATION;  Surgeon: Lanier Prude, MD;  Location: [facility] INV[facility];  Service: Cardiovascular;  Laterality: N/A;   ATRIAL FIBRILLATION ABLATION N/A 03/21/2023   Procedure: ATRIAL FIBRILLATION ABLATION;  Surgeon: Lanier Prude, MD;  Location: [facility] INVASIVE CV LAB;  Service: Cardiovascular;  Laterality: N/A;   BREAST BIOPSY Left 2015   positive   BREAST LUMPECTOMY Left 09/23/2013   CARDIOVERSION N/A 12/28/2022   Procedure: CARDIOVERSION;  Surgeon: Debbe Odea, MD;  Location: [facility] ORS;  Service: Cardiovascular;  Laterality: N/A;   CATARACT EXTRACTION Bilateral    CHOLECYSTECTOMY     ELECTROPHYSIOLOGIC STUDY     GASTRIC FUNDOPLICATION     JOINT REPLACEMENT Right 09/22/2020   R hip replacement   LEFT HEART CATH AND CORONARY ANGIOGRAPHY N/A 12/30/2021   Procedure: LEFT HEART CATH AND CORONARY ANGIOGRAPHY;  Surgeon: Yvonne Kendall, MD;  Location: [facility] INVASIVE CV LAB;  Service: Cardiovascular;  Laterality: N/A;   left lumpectomy      Current Medications: Current Meds  Medication Sig   allopurinol (ZYLOPRIM)  300 MG tablet Take 300 mg by mouth daily.    apixaban (ELIQUIS) 5 MG TABS tablet Take 1 tablet (5 mg total) by mouth 2 (two) times daily.   atorvastatin (LIPITOR) 20 MG tablet Take 20 mg by mouth every evening.   carboxymethylcellulose (REFRESH PLUS) 0.5 % SOLN Place 1 drop into both eyes daily as needed (dry eyes).   dofetilide (TIKOSYN) 250 MCG capsule TAKE 1 CAPSULE TWICE DAILY   famotidine (PEPCID) 20 MG tablet Take 20 mg by mouth at bedtime.   furosemide (LASIX) 20 MG tablet Take 1 tablet (20 mg total) by mouth daily. For  swelling. PLEASE SCHEDULE OFFICE VISIT FOR FURTHER REFILLS. THANK YOU!   gabapentin (NEURONTIN) 300 MG capsule Take 300 mg by mouth 2 (two) times daily at 10 AM and 5 PM.   irbesartan (AVAPRO) 300 MG tablet Take 1 tablet (300 mg total) by mouth daily.   methocarbamol (ROBAXIN) 500 MG tablet Take 500 mg by mouth daily.   oxybutynin (DITROPAN-XL) 5 MG 24 hr tablet Take 5 mg by mouth in the morning.   pantoprazole (PROTONIX) 40 MG tablet Take 1 tablet (40 mg total) by mouth daily.   potassium chloride SA (KLOR-CON M) 20 MEQ tablet Take 1 tablet (20 mEq total) by mouth daily.   Probiotic Product (ADVANCED PROBIOTIC PO) Take 1 tablet by mouth every morning.     Allergies:   Enalapril, Amiodarone, and Tylenol [acetaminophen]   Social History   Socioeconomic History   Marital status: Married    Spouse name: Greggory Stallion   Number of children: 1   Years of education: Not on file   Highest education level: Not on file  Occupational History   Not on file  Tobacco Use   Smoking status: Never   Smokeless tobacco: Never   Tobacco comments:    Never smoke 10/19/21  Vaping Use   Vaping status: Never Used  Substance and Sexual Activity   Alcohol use: No   Drug use: No   Sexual activity: Not on file  Other Topics Concern   Not on file  Social History Narrative   Lives at home with spouse   Social Determinants of Health   Financial Resource Strain: Low Risk  (02/05/2020)   Received from [facility], [facility]   Overall Financial Resource Strain (CARDIA)    Difficulty of Paying Living Expenses: Not hard at all  Food Insecurity: No Food Insecurity (02/05/2020)   Received from [facility], [facility]   Hunger Vital Sign    Worried About Running Out of Food in the Last Year: Never true    Ran Out of Food in the Last Year: Never true  Transportation Needs: No Transportation Needs (02/05/2020)   Received from [facility], [facility]   [facility] - Transportation    In the past 12 months, has lack of transportation kept you from medical appointments or from getting medications?: No    Lack of Transportation (Non-Medical): No  Physical Activity: Inactive (02/05/2020)   Received from [facility], [facility]   Exercise Vital Sign    Days of Exercise per Week: 0 days    Minutes of Exercise per Session: 0 min  Stress: No Stress Concern Present (02/05/2020)   Received from [facility], [facility]   [employer] of Occupational Health - Occupational Stress Questionnaire    Feeling of Stress :  Only a little  Social Connections: Unknown (02/05/2020)   Received from [facility], [facility]   Social Connection and Isolation Panel [NHANES]    Frequency of Communication with Friends and Family: Three times a week    Frequency of Social Gatherings with Friends and Family: Once a week    Attends Religious Services: Not on Marketing executive or Organizations: No    Attends Banker Meetings: Never    Marital Status: Married     Family History: The patient's family history includes Breast cancer in her cousin; Breast cancer (age of onset: 29) in her maternal aunt.  ROS:   Please see the history of present illness.     All other systems reviewed and are negative.  EKGs/Labs/Other Studies Reviewed:    The following studies were reviewed today:   EKG Interpretation Date/Time:  Wednesday May 17 2023 11:27:55 EDT Ventricular Rate:  51 PR Interval:  184 QRS Duration:  82 QT Interval:  468 QTC Calculation: 431 R Axis:   -11  Text Interpretation: Sinus bradycardia Confirmed by Debbe Odea ([id number]) on 05/17/2023 11:39:15 AM    Recent Labs: 06/15/2022: TSH 3.325 09/21/2022: Magnesium 2.1 03/01/2023: BUN 12; Creatinine, Ser 0.79; Hemoglobin  11.7; Platelets 224; Potassium 4.4; Sodium 140  Recent Lipid Panel    Component Value Date/Time   CHOL 170 06/15/2022 1157   TRIG 83 06/15/2022 1157   HDL 67 06/15/2022 1157   CHOLHDL 2.5 06/15/2022 1157   VLDL 17 06/15/2022 1157   LDLCALC 86 06/15/2022 1157    Physical Exam:    VS:  BP 138/78 (BP Location: Left Arm, Patient Position: Sitting, Cuff Size: Normal)   Pulse (!) 51   Ht 5' (1.524 m)   Wt 152 lb 12.8 oz (69.3 kg)   SpO2 93%   BMI 29.84 kg/m     Wt Readings from Last 3 Encounters:  05/17/23 152 lb 12.8 oz (69.3 kg)  04/18/23 157 lb 3.2 oz (71.3 kg)  03/21/23 152 lb (68.9 kg)     GEN:  Well nourished, well developed in no acute distress HEENT: Normal NECK: No JVD; No carotid bruits CARDIAC: Bradycardic, regular, no murmurs, rubs, gallops RESPIRATORY:  Clear to auscultation without rales, wheezing or rhonchi  ABDOMEN: Soft, non-tender, non-distended MUSCULOSKELETAL:  No edema; No deformity  SKIN: Warm and dry NEUROLOGIC:  Alert and oriented x 3 PSYCHIATRIC:  Normal affect   ASSESSMENT:    1. Coronary artery disease involving native coronary artery of native heart without angina pectoris   2. Diastolic dysfunction   3. Paroxysmal atrial fibrillation (HCC)   4. Primary hypertension     PLAN:    In order of problems listed above:  CAD, moderate stenosis in the first diagonal and RCA. denies chest pain.  Continue Eliquis, Lipitor.  Echo 03/2020 EF 55 to 60%.   Grade 2 diastolic dysfunction.  Appears euvolemic, no edema, continue Lasix 20 mg daily. paroxysmal atrial fibrillation, s/p RFA 09/2021, 03/2023.  On Tikosyn 250 mg daily, Eliquis 5 mg twice daily.  Keep appointment with A-fib clinic.  Toprol-XL previously stopped due to bradycardia.   hypertension, blood pressure controlled, continue irbesartan 300 mg daily.  Follow-up in 12 months.    Medication Adjustments/Labs and Tests Ordered: Current medicines are reviewed at length with the patient today.   Concerns regarding medicines are outlined above.  Orders Placed This Encounter  Procedures   EKG 12-Lead  No orders of the defined types were placed in this encounter.     Patient Instructions  Medication Instructions:   Your physician recommends that you continue on your current medications as directed. Please refer to the Current Medication list given to you today.  *If you need a refill on your cardiac medications before your next appointment, please call your pharmacy*   Lab Work:  None Ordered  If you have labs (blood work) drawn today and your tests are completely normal, you will receive your results only by: MyChart Message (if you have MyChart) OR A paper copy in the mail If you have any lab test that is abnormal or we need to change your treatment, we will call you to review the results.   Testing/Procedures:  None Ordered   Follow-Up: At [facility], you and your health needs are our priority.  As part of our continuing mission to provide you with exceptional heart care, we have created designated Provider Care Teams.  These Care Teams include your primary Cardiologist (physician) and Advanced Practice Providers (APPs -  Physician Assistants and Nurse Practitioners) who all work together to provide you with the care you need, when you need it.  We recommend signing up for the patient portal called "MyChart".  Sign up information is provided on this After Visit Summary.  MyChart is used to connect with patients for Virtual Visits (Telemedicine).  Patients are able to view lab/test results, encounter notes, upcoming appointments, etc.  Non-urgent messages can be sent to your provider as well.   To learn more about what you can do with MyChart, go to [email or web address].    Your next appointment:   12 month(s)  Provider:   You may see Debbe Odea, MD or one of the following Advanced Practice Providers on your designated Care Team:    Nicolasa Ducking, NP Eula Listen, PA-C Cadence Fransico Michael, PA-C Charlsie Quest, NP   Signed, Debbe Odea, MD  05/17/2023 12:02 PM    [facility]

## 2023-05-23 DIAGNOSIS — D385 Neoplasm of uncertain behavior of other respiratory organs: Secondary | ICD-10-CM | POA: Diagnosis not present

## 2023-05-30 ENCOUNTER — Other Ambulatory Visit: Payer: Self-pay

## 2023-05-30 MED ORDER — IRBESARTAN 300 MG PO TABS: 300.0000 mg | ORAL_TABLET | Freq: Every day | ORAL | 3 refills | Status: DC

## 2023-06-14 DIAGNOSIS — M7521 Bicipital tendinitis, right shoulder: Secondary | ICD-10-CM | POA: Diagnosis not present

## 2023-06-15 DIAGNOSIS — M7541 Impingement syndrome of right shoulder: Secondary | ICD-10-CM | POA: Diagnosis not present

## 2023-06-15 DIAGNOSIS — M25511 Pain in right shoulder: Secondary | ICD-10-CM | POA: Diagnosis not present

## 2023-06-15 DIAGNOSIS — M7521 Bicipital tendinitis, right shoulder: Secondary | ICD-10-CM | POA: Diagnosis not present

## 2023-06-20 ENCOUNTER — Other Ambulatory Visit: Payer: Self-pay | Admitting: Cardiology

## 2023-06-20 NOTE — Progress Notes (Unsigned)
Electrophysiology Office Follow up Visit Note:    Date:  06/21/2023   ID:  Charlene Coleman, DOB [birth year], MRN [id number]  PCP:  Marisue Ivan, MD  [facility] Cardiologist:  Debbe Odea, MD  [facility] Electrophysiologist:  Lanier Prude, MD    Interval History:     Charlene Coleman is a 82 y.o. female who presents for a follow up visit.   Discussed the use of AI scribe software for clinical note transcription with the patient, who gave verbal consent to proceed.  [name] presents for follow-up.  She had a redo catheter ablation March 21, 2023 for her persistent atrial fibrillation.  At the appointment with Clide Cliff she reported no additional episodes of atrial fibrillation after redo catheter ablation.  She continues to take Tikosyn and Eliquis.   History of Present Illness   The patient, with a history of atrial fibrillation, presents for a routine follow-up. She reports two recent episodes of atrial fibrillation, each lasting about an hour, which is shorter than previous episodes. The heart rate during these episodes was not as high as in the past. Each episode was preceded by a sensation of tightness in the throat that seemed to radiate downwards. This sensation is only experienced during episodes of atrial fibrillation.            Past medical, surgical, social and family history were reviewed.  ROS:   Please see the history of present illness.    All other systems reviewed and are negative.  EKGs/Labs/Other Studies Reviewed:    The following studies were reviewed today:  EKG Interpretation Date/Time:  Wednesday June 21 2023 10:42:46 EST Ventricular Rate:  54 PR Interval:  182 QRS Duration:  78 QT Interval:  458 QTC Calculation: 434 R Axis:   2  Text Interpretation: Sinus bradycardia with Premature atrial complexes Confirmed by Steffanie Dunn [id number]) on 06/21/2023 10:53:23 AM    Physical Exam:    VS:  BP 124/60   Pulse (!) 54   Ht  5' (1.524 m)   Wt 153 lb (69.4 kg)   SpO2 98%   BMI 29.88 kg/m     Wt Readings from Last 3 Encounters:  06/21/23 153 lb (69.4 kg)  05/17/23 152 lb 12.8 oz (69.3 kg)  04/18/23 157 lb 3.2 oz (71.3 kg)     Physical Exam   GENERAL: well appearing elderly woman in no distress CHEST: lungs clear to auscultation CARDIOVASCULAR: heart rhythm regular rate and rhythm          ASSESSMENT:    1. Encounter for long-term (current) use of high-risk medication   2. Primary hypertension   3. Persistent atrial fibrillation (HCC)    PLAN:    In order of problems listed above:  Assessment and Plan    Atrial Fibrillation Two brief episodes of atrial fibrillation with lower heart rate than previous episodes. Associated with throat and chest tightness. EKG today shows normal rhythm. QTc acceptable for ongoing use. -Continue current management.  Chest Pain Recent chest pain likely secondary to indigestion, not cardiac in origin. Previous cardiac catheterization in 2023 showed no significant blockages. -Continue current management.  Hypokalemia Patient expresses difficulty with large potassium supplement pill. Current potassium level unknown. -Check BMP and magnesium today.  Follow-up in four months with Nurse Practitioner Rosalita Chessman.         Update BMP and magnesium      Signed, Steffanie Dunn, MD, [facility], [facility] 06/21/2023 11:00 AM    Electrophysiology [facility]

## 2023-06-21 ENCOUNTER — Ambulatory Visit: Payer: Medicare HMO | Attending: Cardiology | Admitting: Cardiology

## 2023-06-21 ENCOUNTER — Other Ambulatory Visit: Payer: Self-pay

## 2023-06-21 ENCOUNTER — Encounter: Payer: Self-pay | Admitting: Cardiology

## 2023-06-21 VITALS — BP 124/60 | HR 54 | Ht 60.0 in | Wt 153.0 lb

## 2023-06-21 DIAGNOSIS — Z79899 Other long term (current) drug therapy: Secondary | ICD-10-CM

## 2023-06-21 DIAGNOSIS — I10 Essential (primary) hypertension: Secondary | ICD-10-CM | POA: Diagnosis not present

## 2023-06-21 DIAGNOSIS — I4819 Other persistent atrial fibrillation: Secondary | ICD-10-CM | POA: Diagnosis not present

## 2023-06-21 MED ORDER — FUROSEMIDE 20 MG PO TABS: 20.0000 mg | ORAL_TABLET | Freq: Every day | ORAL | 3 refills | Status: DC

## 2023-06-21 NOTE — Patient Instructions (Signed)
Medication Instructions:  Your physician recommends that you continue on your current medications as directed. Please refer to the Current Medication list given to you today.  *If you need a refill on your cardiac medications before your next appointment, please call your pharmacy*   Lab Work: TODAY: BMET and Mag   Follow-Up: At [facility], you and your health needs are our priority.  As part of our continuing mission to provide you with exceptional heart care, we have created designated Provider Care Teams.  These Care Teams include your primary Cardiologist (physician) and Advanced Practice Providers (APPs -  Physician Assistants and Nurse Practitioners) who all work together to provide you with the care you need, when you need it.  Your next appointment:   4 months   Provider:   Sherie Don, NP

## 2023-06-22 LAB — BASIC METABOLIC PANEL
BUN/Creatinine Ratio: 22 (ref 12–28)
BUN: 22 mg/dL (ref 8–27)
CO2: 24 mmol/L (ref 20–29)
Calcium: 9.2 mg/dL (ref 8.7–10.3)
Chloride: 101 mmol/L (ref 96–106)
Creatinine, Ser: 1.01 mg/dL — ABNORMAL HIGH (ref 0.57–1.00)
Glucose: 71 mg/dL (ref 70–99)
Potassium: 4.3 mmol/L (ref 3.5–5.2)
Sodium: 138 mmol/L (ref 134–144)
eGFR: 56 mL/min/{1.73_m2} — ABNORMAL LOW (ref >59)

## 2023-06-22 LAB — MAGNESIUM: Magnesium: 1.9 mg/dL (ref 1.6–2.3)

## 2023-06-27 ENCOUNTER — Other Ambulatory Visit: Payer: Self-pay | Admitting: Cardiology

## 2023-06-27 DIAGNOSIS — M7541 Impingement syndrome of right shoulder: Secondary | ICD-10-CM | POA: Diagnosis not present

## 2023-06-27 DIAGNOSIS — M7521 Bicipital tendinitis, right shoulder: Secondary | ICD-10-CM | POA: Diagnosis not present

## 2023-06-27 DIAGNOSIS — M25511 Pain in right shoulder: Secondary | ICD-10-CM | POA: Diagnosis not present

## 2023-06-27 DIAGNOSIS — I480 Paroxysmal atrial fibrillation: Secondary | ICD-10-CM

## 2023-06-28 NOTE — Telephone Encounter (Signed)
Prescription refill request for Eliquis received. Indication: PAF Last office visit: 06/21/23  Jeanie Cooks MD Scr: 1.01 on 06/21/23  Epic Age: 82 Weight: 69.4kg  Based on above findings Eliquis 5mg  twice daily is the appropriate dose.  Refill approved.

## 2023-07-30 ENCOUNTER — Other Ambulatory Visit (HOSPITAL_COMMUNITY): Payer: Self-pay | Admitting: Physician Assistant

## 2023-08-01 DIAGNOSIS — E782 Mixed hyperlipidemia: Secondary | ICD-10-CM | POA: Diagnosis not present

## 2023-08-08 DIAGNOSIS — Z0000 Encounter for general adult medical examination without abnormal findings: Secondary | ICD-10-CM | POA: Diagnosis not present

## 2023-08-08 DIAGNOSIS — E785 Hyperlipidemia, unspecified: Secondary | ICD-10-CM | POA: Diagnosis not present

## 2023-08-08 DIAGNOSIS — I10 Essential (primary) hypertension: Secondary | ICD-10-CM | POA: Diagnosis not present

## 2023-08-08 NOTE — Progress Notes (Signed)
 CC: Preventative Health Exam  HPI  Charlene Coleman is a 83 y.o. here for preventative health exam and subsequent medicare wellness  Preventative health exam: No acute issues.  Chronic medical issues stable and tolerating medications without adverse effects.  Unclear about regula exercise or specific healthy diet.  No exertional cp or syncopal episodes.  No vaginal symptoms, urinary issues, or rectal pain/bleeding.  Denies any tobacco use.    ROS Review of systems is unremarkable for any active cardiac, respiratory, GI, GU, hematologic, neurologic, dermatologic, HEENT, or psychiatric symptoms except as noted above.  No fevers, chills, or constitutional symptoms.   Current Outpatient Medications  Medication Sig Dispense Refill  . acetaminophen  (TYLENOL ) 500 MG tablet Take 500 mg by mouth once daily Will occasionally take a second dose    . allopurinoL  (ZYLOPRIM ) 300 MG tablet take 1 tablet every day 90 tablet 3  . apixaban  (ELIQUIS ) 5 mg tablet Take 1 tablet (5 mg total) by mouth 2 (two) times daily 180 tablet 3  . atorvastatin  (LIPITOR) 20 MG tablet Take 1 tablet (20 mg total) by mouth once daily 100 tablet 1  . carboxymethylcellulose (REFRESH TEARS) 0.5 % ophthalmic solution Place 1-2 drops into both eyes as needed for Dry Eyes    . dofetilide  (TIKOSYN ) 250 MCG capsule Take 250 mcg by mouth 2 (two) times daily    . famotidine  (PEPCID ) 40 MG tablet TAKE 1 TABLET (40 MG TOTAL) BY MOUTH NIGHTLY 90 tablet 0  . gabapentin (NEURONTIN) 300 MG capsule TAKE 1 CAPSULE TWICE DAILY 180 capsule 3  . irbesartan  (AVAPRO ) 300 MG tablet Take 1 tablet (300 mg total) by mouth once daily 30 tablet 11  . oxyBUTYnin  (DITROPAN -XL) 5 MG XL tablet Take 1 tablet (5 mg total) by mouth once daily 100 tablet 1  . potassium chloride  (KLOR-CON ) 20 MEQ ER tablet Take 1 tablet by mouth once daily    . Saccharomyces boulardii (FLORASTOR) 250 mg capsule Take 250 mg by mouth once daily    . FUROsemide  (LASIX ) 20 MG tablet Take  20 mg by mouth once daily     No current facility-administered medications for this visit.    Allergies as of 08/08/2023 - Reviewed 08/08/2023  Allergen Reaction Noted  . Acetaminophen  Tinnitus 07/29/2020  . Amiodarone  Other (See Comments) 01/12/2022  . Vasotec [enalapril maleate] Cough 10/04/2013    Patient Active Problem List  Diagnosis  . Essential hypertension, benign  . Coronary atherosclerosis of native coronary artery - followed by Dr. Holley  . Mixed hyperlipidemia (LDL 73 - 08/01/23)  . History of gout (uric acid 4.4 - 01/09/18)  . GERD (gastroesophageal reflux disease)  . H/O cardiac catheterization  . FH: colon polyps  . Medicare annual wellness visit, initial: 02/22/13  . Medicare annual wellness visit, subsequent 08/08/23  . Osteopenia of neck of left femur (Dexa 09/14/21 - repeat 2 yrs)  . Primary osteoarthritis of both hands  . History of left breast cancer  . Urge incontinence of urine  . PAF (paroxysmal atrial fibrillation) on Eliquis  - followed by Dr. Holley  . Acquired hypothyroidism, unspecified (TSH 2.8 - 01/24/23) off of medication - followed by Dr. Cherilyn  . Chronic lumbar radiculopathy (left) on gabapentin    Past Medical History:  Diagnosis Date  . Acquired hypothyroidism, unspecified (TSH 19 - 09/14/20) 09/14/2020  . Allergy 2005  . Cancer (CMS/HHS-HCC)    Left breast cancer  . CKD (chronic kidney disease) stage 3, Cr 1.1 and GFR 48 (11/05/20) 12/02/2020  .  Coronary atherosclerosis of native coronary artery    Cardiac Cath 09/06/2007 shows 50% D1 stenosis, 50% distal RCA, 30% RPLS with EF 65%. F[facility] Ammon [facility]  . Empty sella syndrome (CMS/HHS-HCC)   . Essential hypertension, benign   . GERD (gastroesophageal reflux disease) 2005  . H/O mammogram 08/02/2012  . H/O: gout   . Hallux valgus (acquired)   . History of bone density study 06/15/2011  . History of gastroesophageal reflux (GERD)    Nissen fundoplication  surgery 07/2004  . Obesity   . Other and unspecified hyperlipidemia   . Other hammer toe (acquired)   . Synovitis and tenosynovitis, unspecified     Past Surgical History:  Procedure Laterality Date  . TONSILLECTOMY  1949  . APPENDECTOMY  1971  . KNEE ARTHROSCOPY  1989  . EGD  05/17/2004   No repeat per RTE   . COLONOSCOPY  05/17/2004   FH Colon Polyps (Mother/Brother)  . LAPAROSCOPIC ESOPHAGOGASTRIC FUNDOPLASTY NISSEN PROCEDURE  07/2004   For reflux. Surgery by Dr. Unknown Sharps at [facility]  . COLONOSCOPY  08/18/2009   FH Colon Polyps (Mother/Brother): CBF 08/2014; recall ltr mailed 06/20/2014 (dw)  . MASTECTOMY  2016   Partial  . COLONOSCOPY  08/29/2014   Adenomatous Polyps, FH Colon Polyps (Mother/Brother): CBF 08/2019  . EGD  08/29/2014   No repeat per RTE  . COLONOSCOPY  09/17/2019   Tubular adenoma of the colon/Serrated adenoma/No Repeat due to age/TKT  . EGD  09/17/2019   Gastritis/No Repeat/TKT  . ABLATION ARRYTHMIA FOCUS  09/21/2021  . ATRIAL FIBRILLATION ABLATION      03/21/2023 [place]  . CHOLECYSTECTOMY LAPAROSCOPIC W/COMMON BILE DUCT EXPLORATION    . HYSTERECTOMY    . Partial left breast mastectomy      Family History  Problem Relation Name Age of Onset  . Coronary Artery Disease (Blocked arteries around heart) Brother Alexa   . High blood pressure (Hypertension) Brother Alexa   . Alcohol abuse Brother [place]   . Coronary Artery Disease (Blocked arteries around heart) Mother Lynder Finder   . Lung cancer Brother    . Coronary Artery Disease (Blocked arteries around heart) Brother Alm   . High blood pressure (Hypertension) Brother Alm   . Colon polyps Brother Alm   . Inflammatory bowel disease Brother Alm   . Colon polyps Father Charlie?   . Inflammatory bowel disease Father Charlie?   . Colon polyps Brother Tessie Finder   . Coronary Artery Disease (Blocked arteries around heart) Brother Tessie Finder   . Skin cancer Brother  Tessie Finder   . Coronary Artery Disease (Blocked arteries around heart) Brother Alm Finder   . Coronary Artery Disease (Blocked arteries around heart) Brother H G Walker   . High blood pressure (Hypertension) Brother VEAR KANDICE Finder     Social History   Socioeconomic History  . Marital status: Married    Spouse name: Sanyia Dini  . Number of children: 1  Tobacco Use  . Smoking status: Never    Passive exposure: Past  . Smokeless tobacco: Never  Vaping Use  . Vaping status: Never Used  Substance and Sexual Activity  . Alcohol use: Never    Alcohol/week: 0.0 standard drinks of alcohol  . Drug use: Never  . Sexual activity: Not Currently    Partners: Male    Birth control/protection: None   Social Drivers of Health   Financial Resource Strain: Low Risk  (02/05/2020)   Overall Financial  Resource Strain (CARDIA)   . Difficulty of Paying Living Expenses: Not hard at all  Food Insecurity: No Food Insecurity (02/05/2020)   Hunger Vital Sign   . Worried About Programme Researcher, Broadcasting/film/video in the Last Year: Never true   . Ran Out of Food in the Last Year: Never true  Transportation Needs: No Transportation Needs (02/05/2020)   PRAPARE - Transportation   . Lack of Transportation (Medical): No   . Lack of Transportation (Non-Medical): No  Physical Activity: Inactive (02/05/2020)   Exercise Vital Sign   . Days of Exercise per Week: 0 days   . Minutes of Exercise per Session: 0 min  Stress: No Stress Concern Present (02/05/2020)   [employer] of Occupational Health - Occupational Stress Questionnaire   . Feeling of Stress : Only a little  Social Connections: Unknown (02/05/2020)   Social Connection and Isolation Panel [NHANES]   . Frequency of Communication with Friends and Family: Three times a week   . Frequency of Social Gatherings with Friends and Family: Once a week   . Active Member of Clubs or Organizations: No   . Attends Banker Meetings: Never   . Marital Status:  Married  Housing Stability: Unknown (08/08/2023)   Housing Stability Vital Sign   . Homeless in the Last Year: No    Health Maintenance  Topic Date Due  . RSV Immunization Pregnant or 60+ (1 - 1-dose 75+ series) Never done  . Influenza Vaccine (1) 04/02/2023  . Depression Screening  07/27/2023  . Medicare Initial or AWV  07/27/2023  . Mammogram  09/23/2023  . DXA Bone Density Scan  09/15/2023  . TSH Level  01/24/2024  . Creatinine Level  07/31/2024  . Potassium Level  07/31/2024  . Lipid Panel  07/31/2024  . Serum Calcium   07/31/2024  . Colonoscopy  09/16/2024  . Diabetes Screening  06/15/2025  . Adult Tetanus (Td And Tdap)  07/11/2026  . Pneumococcal Vaccine: 65+  Completed  . Shingrix  Completed  . Hib Vaccines  Aged Out  . Hepatitis A Vaccines  Aged Out  . Meningococcal ACWY Vaccine  Aged Out  . HPV Vaccines  Aged Out  . COVID-19 Vaccine  Discontinued    Vitals:   08/08/23 1043  BP: 120/80  Pulse: 55  SpO2: 97%  Weight: 71.2 kg (157 lb)  Height: 152.4 cm (5')  PainSc: 0-No pain   Body mass index is 30.66 kg/m.  Exam  General. Well appearing; NAD; VS reviewed     Eyes. Sclera and conjunctiva clear; Vision grossly intact; extraocular movements intact Neck. Supple. No swelling, masses, thyroid  normal size, no masses palpated.   Lungs. Respirations unlabored; clear to auscultation bilaterally Cardiovascular. Heart regular rate and rhythm without murmurs, gallops, or rubs Abdomen. Soft; non tender; non distended; normoactive bowel sounds; no masses or organomegaly Lymph Nodes. No significant cervical or supraclavicular lymphadenopathy noted Musculoskeletal. No deformities; no active joint inflammation Extremities.  no edema Skin. Normal color and turgor Pulses. Dorsalis pedis palpable and symmetric bilaterally Neurologic. Alert and oriented x3; CN 2-12 grossly intact; no focal deficits  Assessment and Plan  1. Preventative health exam- Stable exam except for  elevated BMI.  CV screening labs reviewed with patient.  HTN and HLD well controlled.  No DM.  Up to date on mammogram (09/22/22 - repeat 1 yr) and colonoscopy (09/17/19 - repeat 5 yrs).  Aged out of pap smears.  Vaccinations reviewed.  Counseled on nutrition modification and exercise.  2. Subsequent medicare wellness Providers Rendering Care 1. Dr. Alda Carpen (PCP) 2. Dr. Holley (Cardiology) 3. Dr. Laurice (Opthalmology) 4. Dr. Cindie (Cardiology) 5. Dr. Cherilyn [facility])   Functional Assessment (1) Hearing: Demonstrates no difficulty in hearing during normal conversation (2) Risk of Falls: Patient denies any recurrent falls, Gait steady without assistance during walk from waiting area to exam room (3) Home Safety: Patient feels secure in their home, There are operational smoke alarms in multiple areas of the home (4) Activities of Daily Living: Independently manages personal grooming and household chores, including cooking, cleaning and laundry. Manages Personal finances without assistance. PHQ 2/9 last 3 flowsheet values     06/15/2021    1:45 PM 07/26/2022    1:04 PM 08/08/2023   11:08 AM  PHQ-2/9 Depression Screening   Little interest or pleasure in doing things   0  Feeling down, depressed, or hopeless   0  Patient Health Questionnaire-2 Score   0  (OBSOLETE) Little interest or pleasure in doing things 0 0   (OBSOLETE) Feeling down, depressed, or hopeless (or irritable for Teens only)? 0 0   (OBSOLETE) Total Prescreening Score 0 0   (OBSOLETE) Total Score = 0 0      Depression Severity and Treatment Recommendations:  0-4= None  5-9= Mild / Treatment: Support, educate to call if worse; return in one month  10-14= Moderate / Treatment: Support, watchful waiting; Antidepressant or Psychotherapy  15-19= Moderately severe / Treatment: Antidepressant OR Psychotherapy  >= 20 = Major depression, severe / Antidepressant AND Psychotherapy    Cognitive Impairment Patient  denies episodes of loosing things, being forgetful. Seems oriented to person, place and time. Responses appear appropriate and timely to this observer. PREVENTION PLAN Item Name Frequency Month Due Year(s) Due Cardiovascular FLP q6 mos  Diabetes FBG annually Glaucoma Annually Neg screening per pt (2014) Hepatitis B (HBV) Vaccine Not Applicable Smoking Cessation Not Applicable Other Personalized Health Advice Encouraged patient to exercise 5 days a week, walking, water aerobics, gentle stretching recommended.  Increase dietary intake of fresh fruits and vegetables, reduce red meat to twice a week. End of Life Counseling Has a living will; POA- Tab L. Ledesma (son); Full Code, however if her condition is deemed terminable, she does not want to be kept alive via artificial means.    Medications and allergies reviewed and reconciled. Preventative health and labs reviewed.   Goals Addressed             This Visit's Progress   . Maintain health/healthy lifestyle   On track      Follow up: 6 months for reck; labs prior  ALDA CARPEN, MD

## 2023-08-23 ENCOUNTER — Other Ambulatory Visit: Payer: Self-pay | Admitting: Family Medicine

## 2023-08-23 DIAGNOSIS — Z1231 Encounter for screening mammogram for malignant neoplasm of breast: Secondary | ICD-10-CM

## 2023-09-05 MED ORDER — DOFETILIDE 250 MCG PO CAPS: 250.0000 ug | ORAL_CAPSULE | Freq: Two times a day (BID) | ORAL | 3 refills | Status: DC

## 2023-09-29 ENCOUNTER — Ambulatory Visit
Admission: RE | Admit: 2023-09-29 | Discharge: 2023-09-29 | Disposition: A | Discharge status: Home / Self Care | Payer: Medicare Other | Source: Ambulatory Visit | Attending: Family Medicine | Admitting: Family Medicine

## 2023-09-29 DIAGNOSIS — Z1231 Encounter for screening mammogram for malignant neoplasm of breast: Secondary | ICD-10-CM | POA: Diagnosis not present

## 2023-10-18 NOTE — Progress Notes (Unsigned)
 Electrophysiology Clinic Note    Date:  10/19/2023  Patient ID:  Charlene Coleman, Charlene Coleman [birth year], MRN [id number] PCP:  Marisue Ivan, MD  Cardiologist:  Debbe Odea, MD Electrophysiologist: Lanier Prude, MD   Discussed the use of AI scribe software for clinical note transcription with the patient, who gave verbal consent to proceed.   Patient Profile    Chief Complaint: AF, tikosyn follow-up  History of Present Illness: Charlene Coleman is a 83 y.o. female with PMH notable for persis AFib, HTN; seen today for Lanier Prude, MD for routine electrophysiology followup.  She is s/p AF ablation w PVI on 09/2021 by Dr. Lalla Brothers. She is s/p redo AF ablation w touch up PVI, posterior wall on 03/2023 also by Dr. Lalla Brothers.  She last saw Dr. Lalla Brothers 06/2023 for routine 3 mon post-ablation appt. She had noted two 45 minute AF episodes, significantly improved than pre-ablation.   On follow-up today, she is doing very well from a cardiac standpoint. She thinks she had 1 hour-1.5 hour AFib episode, but it was not as symptomatic as her pre-ablation AF episodes.  Her main complaint today is URI symptoms including cough, congestion. She has been intermittently taking coricidin cough and cold. No other cough/cold OTC medications. Her husband has also been sick lately with similar symptoms.   She continues to take eliquis BID, no bleeding concerns. She diligently takes tiksoyn q12h, no missed doses.   She denies chest pain, chest pressure, dizziness, presyncope.     Arrhythmia/Device History Tikosyn - loaded 2023 Amiodarone - stopped d/t thryroid dysfunction    ROS:  Please see the history of present illness. All other systems are reviewed and otherwise negative.    Physical Exam    VS:  BP 102/62 (BP Location: Left Arm, Patient Position: Sitting, Cuff Size: Normal)   Pulse (!) 54   Ht 5' (1.524 m)   Wt 151 lb 9.6 oz (68.8 kg)   SpO2 96%   BMI 29.61 kg/m  BMI: Body mass  index is 29.61 kg/m.  Wt Readings from Last 3 Encounters:  10/19/23 151 lb 9.6 oz (68.8 kg)  06/21/23 153 lb (69.4 kg)  05/17/23 152 lb 12.8 oz (69.3 kg)     GEN- The patient is well appearing, alert and oriented x 3 today.   Lungs- Clear to ausculation bilaterally, normal work of breathing.  Heart- Regular rate and rhythm, no murmurs, rubs or gallops Extremities- No peripheral edema, warm, dry    Studies Reviewed   Previous EP, cardiology notes.    EKG is ordered. Personal review of EKG from today shows:  SB at 54bpm;  QT 514, QTC 487        06/21/2023 EKG - SB w PACs at 54bpm QTC  Cardiac CT, 03/14/2023 1. There is normal pulmonary vein drainage into the left atrium with ostial measurements above.  2. There is no thrombus in the left atrial appendage.  3. The esophagus runs in the left atrial midline and is not in proximity to any of the pulmonary vein ostia.  4. No PFO/ASD.  5. Normal coronary origin. Right dominance.  6. CAC score of 1677 which is 95 percentile for age-, race-, and sex-matched controls.   7. Mild mitral annular calcification.  TTE, 01/19/2023  1. Left ventricular ejection fraction, by estimation, is 60 to 65%. The left ventricle has normal function. The left ventricle has no regional wall motion abnormalities. Left ventricular diastolic parameters are indeterminate.  2. Right ventricular systolic function is normal. The right ventricular size is normal.   3. Left atrial size was moderately dilated.   4. The mitral valve is normal in structure. Mild to moderate mitral valve regurgitation. No evidence of mitral stenosis.   5. The aortic valve is normal in structure. Aortic valve regurgitation is not visualized. Aortic valve sclerosis is present, with no evidence of aortic valve stenosis.   6. The inferior vena cava is normal in size with greater than 50% respiratory variability, suggesting right atrial pressure of 3 mmHg.   Assessment and Plan      #) persis AFib #) high risk medication use - tikosyn S/p ablation x 2 (2023, 2024) EKG with slightly prolonged QTC compared to prior Update BMP, Mag today 1 week follow-up with repeat EKG to re-eval QTC Continue tikosyn BID at this time   #) Hypercoag d/t persis afib CHA2DS2-VASc Score = at least 6 [CHF History: 1, HTN History: 1, Diabetes History: 0, Stroke History: 0, Vascular Disease History: 1, Age Score: 2, Gender Score: 1].  Therefore, the patient's annual risk of stroke is 9.7 %.    Stroke ppx - 5mg  eliquis BID, appropriately dosed No bleeding concerns  #) Upper Respiratory Infection Cold symptoms affecting hydration and nutrition. Coricidin is safe with current medications. - Advise on supportive care measures such as hydration, warm showers, and Vicks vapor rub. - Encourage continued use of Coricidin as needed for cold symptoms.        Current medicines are reviewed at length with the patient today.   The patient does not have concerns regarding her medicines.  The following changes were made today:  none  Labs/ tests ordered today include:  Orders Placed This Encounter  Procedures   Basic metabolic panel   Magnesium   EKG 12-Lead     Disposition: Follow up with EP APP   1 week    Signed, Sherie Don, NP  10/19/23  3:55 PM  Electrophysiology [facility]

## 2023-10-19 ENCOUNTER — Ambulatory Visit: Payer: Medicare HMO | Attending: Cardiology | Admitting: Cardiology

## 2023-10-19 ENCOUNTER — Encounter: Payer: Self-pay | Admitting: Cardiology

## 2023-10-19 VITALS — BP 102/62 | HR 54 | Ht 60.0 in | Wt 151.6 lb

## 2023-10-19 DIAGNOSIS — Z79899 Other long term (current) drug therapy: Secondary | ICD-10-CM

## 2023-10-19 DIAGNOSIS — I4819 Other persistent atrial fibrillation: Secondary | ICD-10-CM | POA: Diagnosis not present

## 2023-10-19 DIAGNOSIS — D6869 Other thrombophilia: Secondary | ICD-10-CM | POA: Diagnosis not present

## 2023-10-19 DIAGNOSIS — E039 Hypothyroidism, unspecified: Secondary | ICD-10-CM | POA: Diagnosis not present

## 2023-10-19 NOTE — Patient Instructions (Signed)
 Medication Instructions:  The current medical regimen is effective;  continue present plan and medications as directed. Please refer to the Current Medication list given to you today.   *If you need a refill on your cardiac medications before your next appointment, please call your pharmacy*   Lab Work: Your provider would like for you to have following labs drawn today BMET, MAG.   If you have labs (blood work) drawn today and your tests are completely normal, you will receive your results only by: MyChart Message (if you have MyChart) OR A paper copy in the mail If you have any lab test that is abnormal or we need to change your treatment, we will call you to review the results.   Follow-Up: At [facility], you and your health needs are our priority.  As part of our continuing mission to provide you with exceptional heart care, we have created designated Provider Care Teams.  These Care Teams include your primary Cardiologist (physician) and Advanced Practice Providers (APPs -  Physician Assistants and Nurse Practitioners) who all work together to provide you with the care you need, when you need it.  We recommend signing up for the patient portal called "MyChart".  Sign up information is provided on this After Visit Summary.  MyChart is used to connect with patients for Virtual Visits (Telemedicine).  Patients are able to view lab/test results, encounter notes, upcoming appointments, etc.  Non-urgent messages can be sent to your provider as well.   To learn more about what you can do with MyChart, go to [email or web address].    Your next appointment:   1 week(s)  Provider:   Sherie Don, NP

## 2023-10-20 LAB — MAGNESIUM: Magnesium: 1.8 mg/dL (ref 1.6–2.3)

## 2023-10-20 LAB — BASIC METABOLIC PANEL
BUN/Creatinine Ratio: 12 (ref 12–28)
BUN: 11 mg/dL (ref 8–27)
CO2: 19 mmol/L — ABNORMAL LOW (ref 20–29)
Calcium: 9.2 mg/dL (ref 8.7–10.3)
Chloride: 104 mmol/L (ref 96–106)
Creatinine, Ser: 0.93 mg/dL (ref 0.57–1.00)
Glucose: 94 mg/dL (ref 70–99)
Potassium: 4.2 mmol/L (ref 3.5–5.2)
Sodium: 140 mmol/L (ref 134–144)
eGFR: 62 mL/min/{1.73_m2} (ref >59)

## 2023-10-26 NOTE — Progress Notes (Unsigned)
 Electrophysiology Clinic Note    Date:  10/27/2023  Patient ID:  Charlene, Coleman 26-Apr-1941, MRN [id number] PCP:  Marisue Ivan, MD  Cardiologist:  Debbe Odea, MD Electrophysiologist: Lanier Prude, MD   Discussed the use of AI scribe software for clinical note transcription with the patient, who gave verbal consent to proceed.   Patient Profile    Chief Complaint: AF, tikosyn follow-up  History of Present Illness: Charlene Coleman is a 83 y.o. female with PMH notable for persis AFib, HTN; seen today for Lanier Prude, MD for routine electrophysiology followup.  She is s/p AF ablation w PVI on 09/2021 by Dr. Lalla Brothers. She is s/p redo AF ablation w touch up PVI, posterior wall on 03/2023 also by Dr. Lalla Brothers.  She last saw Dr. Lalla Brothers 06/2023 for routine 3 mon post-ablation appt. She had noted two 45 minute AF episodes, significantly improved than pre-ablation.   I saw her last week for routine tikosn follow-up and QTC was slightly prolonged at , previously ~467ms. Labs stable. She c/o URI symptoms with somewhat decreased PO intake. Tikosyn continued at BID.  On follow-up today, her URI symptoms have significant improved, still has some congestion, but back to eating and drinking normally. Over the past several days, she has had increased dizziness and describes the sensation as the room spinning. These epidosdes occur primary at ngiht when changing positions in bed. She has h/o vertigo but these feel somewhat different.   She also had one episode of AF this past week that lasted for about 2 hours. It was milder in symptoms than her typical AF episodes.   She continues to take eliquis BID, no bleeding concerns. She diligently takes tiksoyn q12h, no missed doses.   She denies chest pain, chest pressure, dizziness, presyncope.     Arrhythmia/Device History Tikosyn - loaded 2023 Amiodarone - stopped d/t thryroid dysfunction    ROS:  Please see the  history of present illness. All other systems are reviewed and otherwise negative.    Physical Exam    VS:  BP 118/66 (BP Location: Left Arm, Patient Position: Sitting, Cuff Size: Normal)   Pulse (!) 54   Ht 5' (1.524 m)   Wt 151 lb 3.2 oz (68.6 kg)   SpO2 97%   BMI 29.53 kg/m  BMI: Body mass index is 29.53 kg/m.  Wt Readings from Last 3 Encounters:  10/27/23 151 lb 3.2 oz (68.6 kg)  10/19/23 151 lb 9.6 oz (68.8 kg)  06/21/23 153 lb (69.4 kg)     GEN- The patient is well appearing, alert and oriented x 3 today.   Lungs- Clear to ausculation bilaterally, normal work of breathing.  Heart- Regular rate and rhythm, no murmurs, rubs or gallops Extremities- No peripheral edema, warm, dry    Studies Reviewed   Previous EP, cardiology notes.    EKG is ordered. Personal review of EKG from today shows:    EKG Interpretation Date/Time:  Friday October 27 2023 13:36:46 EDT Ventricular Rate:  54 PR Interval:  186 QRS Duration:  78 QT Interval:  480 QTC Calculation: 455 R Axis:   -4  Text Interpretation: Sinus bradycardia Nonspecific T wave abnormality Confirmed by Sherie Don [phone]) on 10/27/2023 1:43:06 PM Also confirmed by Sherie Don [phone])  on 10/27/2023 3:12:32 PM    10/19/2023 EKG SB at 54bpm;  QT 514, QTC 487  06/21/2023 EKG - SB w PACs at 54bpm QTC  Cardiac CT, 03/14/2023 1.  There is normal pulmonary vein drainage into the left atrium with ostial measurements above.  2. There is no thrombus in the left atrial appendage.  3. The esophagus runs in the left atrial midline and is not in proximity to any of the pulmonary vein ostia.  4. No PFO/ASD.  5. Normal coronary origin. Right dominance.  6. CAC score of 1677 which is 95 percentile for age-, race-, and sex-matched controls.   7. Mild mitral annular calcification.  TTE, 01/19/2023  1. Left ventricular ejection fraction, by estimation, is 60 to 65%. The left ventricle has normal function. The left ventricle  has no regional wall motion abnormalities. Left ventricular diastolic parameters are indeterminate.   2. Right ventricular systolic function is normal. The right ventricular size is normal.   3. Left atrial size was moderately dilated.   4. The mitral valve is normal in structure. Mild to moderate mitral valve regurgitation. No evidence of mitral stenosis.   5. The aortic valve is normal in structure. Aortic valve regurgitation is not visualized. Aortic valve sclerosis is present, with no evidence of aortic valve stenosis.   6. The inferior vena cava is normal in size with greater than 50% respiratory variability, suggesting right atrial pressure of 3 mmHg.   Assessment and Plan     #) persis AFib #) high risk medication use - tikosyn S/p ablation x 2 (2023, 2024) EKG with improved QTC compared to last week, 455 today, 480 last week Continue tikosyn BID at this time   #) Hypercoag d/t persis afib CHA2DS2-VASc Score = at least 6 [CHF History: 1, HTN History: 1, Diabetes History: 0, Stroke History: 0, Vascular Disease History: 1, Age Score: 2, Gender Score: 1].  Therefore, the patient's annual risk of stroke is 9.7 %.    Stroke ppx - 5mg  eliquis BID, appropriately dosed No bleeding concerns  #) positional dizziness #) h/o vertigo Room spinning sensation sounds like vertigo, especially given they happen while she is laying in bed adjusting her position.  Recommend she discuss with PCP        Current medicines are reviewed at length with the patient today.   The patient does not have concerns regarding her medicines.  The following changes were made today:  none  Labs/ tests ordered today include:  Orders Placed This Encounter  Procedures   EKG 12-Lead     Disposition: Follow up with Dr. Lalla Brothers or EP APP  in 6 weeks    Signed, Sherie Don, NP  10/27/23  3:12 PM  Electrophysiology [facility]

## 2023-10-27 ENCOUNTER — Ambulatory Visit: Attending: Cardiology | Admitting: Cardiology

## 2023-10-27 VITALS — BP 118/66 | HR 54 | Ht 60.0 in | Wt 151.2 lb

## 2023-10-27 DIAGNOSIS — Z5181 Encounter for therapeutic drug level monitoring: Secondary | ICD-10-CM | POA: Diagnosis not present

## 2023-10-27 DIAGNOSIS — D6869 Other thrombophilia: Secondary | ICD-10-CM | POA: Diagnosis not present

## 2023-10-27 DIAGNOSIS — I4819 Other persistent atrial fibrillation: Secondary | ICD-10-CM | POA: Diagnosis not present

## 2023-10-27 DIAGNOSIS — Z79899 Other long term (current) drug therapy: Secondary | ICD-10-CM

## 2023-10-27 NOTE — Patient Instructions (Signed)
 Medication Instructions:   No changes  *If you need a refill on your cardiac medications before your next appointment, please call your pharmacy*   Follow-Up: At [facility], you and your health needs are our priority.  As part of our continuing mission to provide you with exceptional heart care, our providers are all part of one team.  This team includes your primary Cardiologist (physician) and Advanced Practice Providers or APPs (Physician Assistants and Nurse Practitioners) who all work together to provide you with the care you need, when you need it.  Your next appointment:   6 week(s)  Provider:   Sherie Don, NP   Other Recommendations Schedule appt with primary care to talk about dizziness, vertigo symptoms

## 2023-11-10 DIAGNOSIS — M25511 Pain in right shoulder: Secondary | ICD-10-CM | POA: Diagnosis not present

## 2023-11-10 DIAGNOSIS — M1712 Unilateral primary osteoarthritis, left knee: Secondary | ICD-10-CM | POA: Diagnosis not present

## 2023-11-17 ENCOUNTER — Other Ambulatory Visit: Payer: Self-pay | Admitting: Cardiology

## 2023-11-17 DIAGNOSIS — I480 Paroxysmal atrial fibrillation: Secondary | ICD-10-CM

## 2023-11-17 NOTE — Telephone Encounter (Signed)
 Prescription refill request for Eliquis  received. Indication: a fib Last office visit: 10/27/23 Scr: 0.93 epic 10/19/23 Age: 83 Weight: 68kg

## 2023-11-21 DIAGNOSIS — L814 Other melanin hyperpigmentation: Secondary | ICD-10-CM | POA: Diagnosis not present

## 2023-11-21 DIAGNOSIS — Z85828 Personal history of other malignant neoplasm of skin: Secondary | ICD-10-CM | POA: Diagnosis not present

## 2023-11-21 DIAGNOSIS — Z08 Encounter for follow-up examination after completed treatment for malignant neoplasm: Secondary | ICD-10-CM | POA: Diagnosis not present

## 2023-11-21 DIAGNOSIS — D385 Neoplasm of uncertain behavior of other respiratory organs: Secondary | ICD-10-CM | POA: Diagnosis not present

## 2023-11-22 DIAGNOSIS — M25511 Pain in right shoulder: Secondary | ICD-10-CM | POA: Diagnosis not present

## 2023-11-27 DIAGNOSIS — M75101 Unspecified rotator cuff tear or rupture of right shoulder, not specified as traumatic: Secondary | ICD-10-CM | POA: Diagnosis not present

## 2023-12-04 DIAGNOSIS — M25511 Pain in right shoulder: Secondary | ICD-10-CM | POA: Diagnosis not present

## 2023-12-04 DIAGNOSIS — M25611 Stiffness of right shoulder, not elsewhere classified: Secondary | ICD-10-CM | POA: Diagnosis not present

## 2023-12-05 DIAGNOSIS — K080 Exfoliation of teeth due to systemic causes: Secondary | ICD-10-CM | POA: Diagnosis not present

## 2023-12-08 ENCOUNTER — Ambulatory Visit: Attending: Cardiology | Admitting: Cardiology

## 2023-12-08 VITALS — BP 132/74 | HR 53 | Ht 60.0 in | Wt 148.6 lb

## 2023-12-08 DIAGNOSIS — Z79899 Other long term (current) drug therapy: Secondary | ICD-10-CM | POA: Diagnosis not present

## 2023-12-08 DIAGNOSIS — D6869 Other thrombophilia: Secondary | ICD-10-CM | POA: Diagnosis not present

## 2023-12-08 DIAGNOSIS — I4819 Other persistent atrial fibrillation: Secondary | ICD-10-CM

## 2023-12-08 DIAGNOSIS — Z5181 Encounter for therapeutic drug level monitoring: Secondary | ICD-10-CM

## 2023-12-08 NOTE — Progress Notes (Signed)
 Electrophysiology Clinic Note    Date:  12/08/2023  Patient ID:  Charlene Coleman, Charlene Coleman 1940/09/01, MRN [id number] PCP:  Monique Ano, MD  Cardiologist:  Constancia Delton, MD Electrophysiologist: Boyce Byes, MD   Discussed the use of AI scribe software for clinical note transcription with the patient, who gave verbal consent to proceed.   Patient Profile    Chief Complaint: AF, tikosyn  follow-up  History of Present Illness: Charlene Coleman is a 83 y.o. female with PMH notable for persis AFib, HTN; seen today for Boyce Byes, MD for routine electrophysiology followup.  She is s/p AF ablation w PVI on 09/2021 by Dr. Marven Slimmer. She is s/p redo AF ablation w redo PVI, posterior wall on 03/2023 also by Dr. Marven Slimmer.   I saw her mid-March 2025 where her QTC had prolonged slightly to , previously ~41ms. Labs stable. She c/o URI symptoms with somewhat decreased PO intake. Tikosyn  continued at 250mcg BID. I saw her 1 week later where QTC was .   On follow-up today, she had an episode of AF recently in the middle of the night, lasted for about 45 minutes. This is significantly improved from prior where her episodes would last several hours.    She continues to take eliquis  BID, no bleeding concerns. She diligently takes tiksoyn q12h, no missed doses.   She denies chest pain, chest pressure, dizziness, presyncope.     Arrhythmia/Device History Tikosyn  - loaded 2023 Amiodarone  - stopped d/t thryroid dysfunction    ROS:  Please see the history of present illness. All other systems are reviewed and otherwise negative.    Physical Exam    VS:  BP 132/74 (BP Location: Left Arm, Patient Position: Sitting, Cuff Size: Normal)   Pulse (!) 53   Ht 5' (1.524 m)   Wt 148 lb 9.6 oz (67.4 kg)   SpO2 98%   BMI 29.02 kg/m  BMI: Body mass index is 29.02 kg/m.  Wt Readings from Last 3 Encounters:  12/08/23 148 lb 9.6 oz (67.4 kg)  10/27/23 151 lb 3.2 oz (68.6 kg)   10/19/23 151 lb 9.6 oz (68.8 kg)     GEN- The patient is well appearing, alert and oriented x 3 today.   Lungs- Clear to ausculation bilaterally, normal work of breathing.  Heart- Regular rate and rhythm, no murmurs, rubs or gallops Extremities- No peripheral edema, warm, dry    Studies Reviewed   Previous EP, cardiology notes.    EKG is ordered. Personal review of EKG from today shows:    EKG Interpretation Date/Time:  Friday Dec 08 2023 09:16:19 EDT Ventricular Rate:  53 PR Interval:  186 QRS Duration:  78 QT Interval:  468 QTC Calculation: 439 R Axis:   -9  Text Interpretation: Sinus bradycardia Nonspecific T wave abnormality Confirmed by Deklynn Charlet [phone]) on 12/08/2023 9:19:37 AM    10/27/2023 EKG - SB at 54;  QT 480, QTC 455   10/19/2023 EKG SB at 54bpm;  QT 514, QTC 487  06/21/2023 EKG - SB w PACs at 54bpm QTC  Cardiac CT, 03/14/2023 1. There is normal pulmonary vein drainage into the left atrium with ostial measurements above.  2. There is no thrombus in the left atrial appendage.  3. The esophagus runs in the left atrial midline and is not in proximity to any of the pulmonary vein ostia.  4. No PFO/ASD.  5. Normal coronary origin. Right dominance.  6. CAC score of 1677 which  is 95 percentile for age-, race-, and sex-matched controls.   7. Mild mitral annular calcification.  TTE, 01/19/2023  1. Left ventricular ejection fraction, by estimation, is 60 to 65%. The left ventricle has normal function. The left ventricle has no regional wall motion abnormalities. Left ventricular diastolic parameters are indeterminate.   2. Right ventricular systolic function is normal. The right ventricular size is normal.   3. Left atrial size was moderately dilated.   4. The mitral valve is normal in structure. Mild to moderate mitral valve regurgitation. No evidence of mitral stenosis.   5. The aortic valve is normal in structure. Aortic valve regurgitation is not  visualized. Aortic valve sclerosis is present, with no evidence of aortic valve stenosis.   6. The inferior vena cava is normal in size with greater than 50% respiratory variability, suggesting right atrial pressure of 3 mmHg.   Assessment and Plan     #) persis AFib #) high risk medication use - tikosyn  S/p ablation x 2 (2023, 2024) Continues to have brief episodes of AFib. Patient is happy with her current level of control EKG with stable QTC at Continue tikosyn  BID at this time Update BMP, mag today   #) Hypercoag d/t persis afib CHA2DS2-VASc Score = at least 6 [CHF History: 1, HTN History: 1, Diabetes History: 0, Stroke History: 0, Vascular Disease History: 1, Age Score: 2, Gender Score: 1].  Therefore, the patient's annual risk of stroke is 9.7 %.    Stroke ppx - 5mg  eliquis  BID, appropriately dosed No bleeding concerns      Current medicines are reviewed at length with the patient today.   The patient does not have concerns regarding her medicines.  The following changes were made today:  none  Labs/ tests ordered today include:  Orders Placed This Encounter  Procedures   Basic metabolic panel with GFR   Magnesium    EKG 12-Lead     Disposition: Follow up with Dr. Marven Slimmer or EP APP in 3 months   Signed, Adaline Holly, NP  12/08/23  10:04 AM  Electrophysiology [facility]

## 2023-12-08 NOTE — Patient Instructions (Signed)
 Medication Instructions:  The current medical regimen is effective;  continue present plan and medications as directed. Please refer to the Current Medication list given to you today.   *If you need a refill on your cardiac medications before your next appointment, please call your pharmacy*  Lab Work: Your provider would like for you to have following labs drawn today BMET, MAG.    If you have labs (blood work) drawn today and your tests are completely normal, you will receive your results only by: MyChart Message (if you have MyChart) OR A paper copy in the mail If you have any lab test that is abnormal or we need to change your treatment, we will call you to review the results.  Follow-Up: At [facility], you and your health needs are our priority.  As part of our continuing mission to provide you with exceptional heart care, our providers are all part of one team.  This team includes your primary Cardiologist (physician) and Advanced Practice Providers or APPs (Physician Assistants and Nurse Practitioners) who all work together to provide you with the care you need, when you need it.  Your next appointment:   3 month(s)  Provider:   Suzann Riddle, NP    We recommend signing up for the patient portal called "MyChart".  Sign up information is provided on this After Visit Summary.  MyChart is used to connect with patients for Virtual Visits (Telemedicine).  Patients are able to view lab/test results, encounter notes, upcoming appointments, etc.  Non-urgent messages can be sent to your provider as well.   To learn more about what you can do with MyChart, go to [email or web address].

## 2023-12-09 LAB — BASIC METABOLIC PANEL WITH GFR
BUN/Creatinine Ratio: 11 — ABNORMAL LOW (ref 12–28)
BUN: 10 mg/dL (ref 8–27)
CO2: 19 mmol/L — ABNORMAL LOW (ref 20–29)
Calcium: 9.3 mg/dL (ref 8.7–10.3)
Chloride: 103 mmol/L (ref 96–106)
Creatinine, Ser: 0.91 mg/dL (ref 0.57–1.00)
Glucose: 74 mg/dL (ref 70–99)
Potassium: 4.2 mmol/L (ref 3.5–5.2)
Sodium: 140 mmol/L (ref 134–144)
eGFR: 63 mL/min/{1.73_m2} (ref >59)

## 2023-12-09 LAB — MAGNESIUM: Magnesium: 2.1 mg/dL (ref 1.6–2.3)

## 2023-12-29 DIAGNOSIS — M1712 Unilateral primary osteoarthritis, left knee: Secondary | ICD-10-CM | POA: Diagnosis not present

## 2024-01-24 ENCOUNTER — Other Ambulatory Visit: Payer: Self-pay | Admitting: Cardiology

## 2024-01-24 DIAGNOSIS — I480 Paroxysmal atrial fibrillation: Secondary | ICD-10-CM

## 2024-01-24 NOTE — Telephone Encounter (Signed)
 Eliquis  5mg  refill request received. Patient is 83 years old, weight-67.4kg, Crea-0.91 on 12/08/23, Diagnosis-Afib, and last seen by Suzann Riddle on 12/08/23. Dose is appropriate based on dosing criteria. Will send in refill to requested pharmacy.

## 2024-01-26 DIAGNOSIS — M1712 Unilateral primary osteoarthritis, left knee: Secondary | ICD-10-CM | POA: Diagnosis not present

## 2024-01-26 DIAGNOSIS — M1811 Unilateral primary osteoarthritis of first carpometacarpal joint, right hand: Secondary | ICD-10-CM | POA: Diagnosis not present

## 2024-01-30 DIAGNOSIS — H524 Presbyopia: Secondary | ICD-10-CM | POA: Diagnosis not present

## 2024-01-30 DIAGNOSIS — H35033 Hypertensive retinopathy, bilateral: Secondary | ICD-10-CM | POA: Diagnosis not present

## 2024-01-30 DIAGNOSIS — H26493 Other secondary cataract, bilateral: Secondary | ICD-10-CM | POA: Diagnosis not present

## 2024-01-30 DIAGNOSIS — D3132 Benign neoplasm of left choroid: Secondary | ICD-10-CM | POA: Diagnosis not present

## 2024-01-30 DIAGNOSIS — I10 Essential (primary) hypertension: Secondary | ICD-10-CM | POA: Diagnosis not present

## 2024-03-06 DIAGNOSIS — I10 Essential (primary) hypertension: Secondary | ICD-10-CM | POA: Diagnosis not present

## 2024-03-06 DIAGNOSIS — E782 Mixed hyperlipidemia: Secondary | ICD-10-CM | POA: Diagnosis not present

## 2024-03-12 DIAGNOSIS — M5416 Radiculopathy, lumbar region: Secondary | ICD-10-CM | POA: Diagnosis not present

## 2024-03-12 DIAGNOSIS — D6869 Other thrombophilia: Secondary | ICD-10-CM | POA: Diagnosis not present

## 2024-03-13 DIAGNOSIS — I10 Essential (primary) hypertension: Secondary | ICD-10-CM | POA: Diagnosis not present

## 2024-03-13 DIAGNOSIS — Z8739 Personal history of other diseases of the musculoskeletal system and connective tissue: Secondary | ICD-10-CM | POA: Diagnosis not present

## 2024-03-13 DIAGNOSIS — K219 Gastro-esophageal reflux disease without esophagitis: Secondary | ICD-10-CM | POA: Diagnosis not present

## 2024-03-13 DIAGNOSIS — E782 Mixed hyperlipidemia: Secondary | ICD-10-CM | POA: Diagnosis not present

## 2024-03-18 NOTE — Progress Notes (Unsigned)
 Electrophysiology Clinic Note    Date:  03/19/2024  Patient ID:  Charlene Coleman July 07, 1941, MRN [id number] PCP:  Alla Amis, MD  Cardiologist:  [name], MD   Electrophysiologist:  [name], MD  Electrophysiology APP:  Haset Oaxaca, NP    Discussed the use of AI scribe software for clinical note transcription with the patient, who gave verbal consent to proceed.   Patient Profile    Chief Complaint: AFib, tikosyn  follow-up  History of Present Illness: Charlene Coleman is a 83 y.o. female with PMH notable for persis AFib, HTN ; seen today for [name], MD for routine electrophysiology followup.   She is s/p AF ablation w PVI on 09/2021 by Dr. HOLTS. She is s/p redo AF ablation w touch up PVI, posterior wall on 03/2023 also by Dr. HOLTS.  I last saw her 11/2023 for tikosyn  follow-up. She continues to have paroxysmal AF episodes, but significantly improved since 2024 ablation.    On follow-up today, she is overall doing very well. She had one AF episode about 3 weeks ago that lasted ~45 minutes. It happened during the day, which is atypical for her - she usually wakes from sleep with AFib. This episode also was not symptomatic in the same way her usual AFib episode are - she did not have the usual back pain and choking sensation.  She continues to take tikosyn  9a/9p without missing doses. Continues to take elqiuis BID without bleeding concerns.  Her BP was elevated during appt with PCP last week, planned ot continue to monitor. She has monitored her BP several times a day and her readings are always 160-170s systolic. She denies chest pain, chest pressure, dizziness or presyncope. She does have to take breaks if she is up on her feet for long periods of time, like with cooking long meals.       Arrhythmia/Device History Tikosyn  - loaded 2023 Amiodarone  - stopped d/t thryroid dysfunction    ROS:  Please see the history of present  illness. All other systems are reviewed and otherwise negative.    Physical Exam    VS:  BP (!) 172/64   Pulse (!) 48   Ht 5' (1.524 m)   Wt 146 lb 6.4 oz (66.4 kg)   SpO2 98%   BMI 28.59 kg/m  BMI: Body mass index is 28.59 kg/m.    Vitals:   03/19/24 1050 03/19/24 1127  BP: (!) 162/82 (!) 172/64  Pulse: (!) 48   Height: 5' (1.524 m)   Weight: 146 lb 6.4 oz (66.4 kg)   SpO2: 98%   BMI (Calculated): 28.59      Wt Readings from Last 3 Encounters:  03/19/24 146 lb 6.4 oz (66.4 kg)  12/08/23 148 lb 9.6 oz (67.4 kg)  10/27/23 151 lb 3.2 oz (68.6 kg)     GEN- The patient is well appearing, alert and oriented x 3 today.   Lungs- Clear to ausculation bilaterally, normal work of breathing.  Heart- Regular, bradycardic rate and rhythm, no murmurs, rubs or gallops Extremities- No peripheral edema, warm, dry    Studies Reviewed   Previous EP, cardiology notes.    EKG is ordered. Personal review of EKG from today shows:    EKG Interpretation Date/Time:  Tuesday March 19 2024 10:54:10 EDT Ventricular Rate:  48 PR Interval:  186 QRS Duration:  80 QT Interval:  490 QTC Calculation: 437 R Axis:   -7  Text Interpretation: Sinus  bradycardia Confirmed by Susanne Baumgarner [phone]) on 03/19/2024 11:02:07 AM    11/2023 EKG - SB at 53, QTC 439  10/27/2023 EKG - SB at 54; QTC 455   10/19/2023 EKG - SB at 54bpm; QTC 487   06/21/2023 EKG - SB w PACs at 54bpm; QTC  Cardiac CT, 03/14/2023 1. There is normal pulmonary vein drainage into the left atrium with ostial measurements above.  2. There is no thrombus in the left atrial appendage.  3. The esophagus runs in the left atrial midline and is not in proximity to any of the pulmonary vein ostia.  4. No PFO/ASD.  5. Normal coronary origin. Right dominance.  6. CAC score of 1677 which is 95 percentile for age-, race-, and sex-matched controls.   7. Mild mitral annular calcification.   TTE, 01/19/2023  1. Left ventricular  ejection fraction, by estimation, is 60 to 65%. The left ventricle has normal function. The left ventricle has no regional wall motion abnormalities. Left ventricular diastolic parameters are indeterminate.   2. Right ventricular systolic function is normal. The right ventricular size is normal.   3. Left atrial size was moderately dilated.   4. The mitral valve is normal in structure. Mild to moderate mitral valve regurgitation. No evidence of mitral stenosis.   5. The aortic valve is normal in structure. Aortic valve regurgitation is not visualized. Aortic valve sclerosis is present, with no evidence of aortic valve stenosis.   6. The inferior vena cava is normal in size with greater than 50% respiratory variability, suggesting right atrial pressure of 3 mmHg.    Assessment and Plan     #) persis Afib #) tikosyn  monitoring #) bradycardia S/p ablation x 2 (2023, 2024) Continues to have paroxysmal episodes that are relatively short in duration EKG with stable QTC at Continue 250mcg tikosyn  BID Update BMP, mag today  #) Hypercoag d/t persis afib CHA2DS2-VASc Score = at least 5 [CHF History: 0, HTN History: 1, Diabetes History: 0, Stroke History: 0, Vascular Disease History: 1, Age Score: 2, Gender Score: 1].  Therefore, the patient's annual risk of stroke is 7.2 %.    Stroke ppx - 5mg  eliquis  BID, appropriately dosed No bleeding concerns  #) HTN Elevated in office and by home readings Avoid AVN blocking agents with patient's bradycardia Will start 2.5mg  amlodipine  Continue 300mg  irbesartan  Recommended she continue to check BP regularly at home and notify office if BP continues to be elevated or becomes too low      Current medicines are reviewed at length with the patient today.   The patient does not have concerns regarding her medicines.  The following changes were made today:   START 2.5mg  amlodipine   Labs/ tests ordered today include:  Orders Placed This Encounter   Procedures   CBC   Basic metabolic panel with GFR   Magnesium    EKG 12-Lead     Disposition: Follow up with Dr. Cindie or EP APP in 4 months   Follow up with Dr. Darliss or gen cards APP in 4-6 weeks for BP mgmt   Signed, Chantal Needle, NP  03/19/24  12:53 PM  Electrophysiology [facility]

## 2024-03-19 ENCOUNTER — Ambulatory Visit: Attending: Cardiology | Admitting: Cardiology

## 2024-03-19 VITALS — BP 172/64 | HR 48 | Ht 60.0 in | Wt 146.4 lb

## 2024-03-19 DIAGNOSIS — Z79899 Other long term (current) drug therapy: Secondary | ICD-10-CM

## 2024-03-19 DIAGNOSIS — Z5181 Encounter for therapeutic drug level monitoring: Secondary | ICD-10-CM

## 2024-03-19 DIAGNOSIS — I10 Essential (primary) hypertension: Secondary | ICD-10-CM

## 2024-03-19 DIAGNOSIS — I4819 Other persistent atrial fibrillation: Secondary | ICD-10-CM

## 2024-03-19 DIAGNOSIS — D6869 Other thrombophilia: Secondary | ICD-10-CM

## 2024-03-19 MED ORDER — AMLODIPINE BESYLATE 2.5 MG PO TABS: 2.5000 mg | ORAL_TABLET | Freq: Every day | ORAL | 3 refills | Status: DC

## 2024-03-19 NOTE — Patient Instructions (Signed)
 Medication Instructions:  Your physician recommends the following medication changes.  Take all your other prescribed medications and   START TAKING: AmLODipine  2.5 mg by mouth daily    *If you need a refill on your cardiac medications before your next appointment, please call your pharmacy*  Lab Work: Your provider would like for you to have following labs drawn today CBC, BMET, MAG.   If you have labs (blood work) drawn today and your tests are completely normal, you will receive your results only by: MyChart Message (if you have MyChart) OR A paper copy in the mail If you have any lab test that is abnormal or we need to change your treatment, we will call you to review the results.  Testing/Procedures: No test ordered today   Follow-Up: At [facility], you and your health needs are our priority.  As part of our continuing mission to provide you with exceptional heart care, our providers are all part of one team.  This team includes your primary Cardiologist (physician) and Advanced Practice Providers or APPs (Physician Assistants and Nurse Practitioners) who all work together to provide you with the care you need, when you need it.  Your next appointment:   4-6 week(s)  Provider:   You may see [name], MD or one of the following Advanced Practice Providers on your designated Care Team:   Lonni Meager, NP Lesley Maffucci, PA-C Bernardino Bring, PA-C Cadence Franchester, PA-C Tylene Lunch, NP Barnie Hila, NP    4 months with Dr.Lambert   We recommend signing up for the patient portal called MyChart.  Sign up information is provided on this After Visit Summary.  MyChart is used to connect with patients for Virtual Visits (Telemedicine).  Patients are able to view lab/test results, encounter notes, upcoming appointments, etc.  Non-urgent messages can be sent to your provider as well.   To learn more about what you can do with MyChart, go to  [email or web address].

## 2024-03-20 ENCOUNTER — Ambulatory Visit: Payer: Self-pay | Admitting: Cardiology

## 2024-03-20 LAB — CBC
Hematocrit: 37.8 % (ref 34.0–46.6)
Hemoglobin: 11.8 g/dL (ref 11.1–15.9)
MCH: 29.8 pg (ref 26.6–33.0)
MCHC: 31.2 g/dL — ABNORMAL LOW (ref 31.5–35.7)
MCV: 96 fL (ref 79–97)
Platelets: 240 x10E3/uL (ref 150–450)
RBC: 3.96 x10E6/uL (ref 3.77–5.28)
RDW: 14.7 % (ref 11.7–15.4)
WBC: 6.7 x10E3/uL (ref 3.4–10.8)

## 2024-03-20 LAB — BASIC METABOLIC PANEL WITH GFR
BUN/Creatinine Ratio: 13 (ref 12–28)
BUN: 13 mg/dL (ref 8–27)
CO2: 21 mmol/L (ref 20–29)
Calcium: 9.6 mg/dL (ref 8.7–10.3)
Chloride: 104 mmol/L (ref 96–106)
Creatinine, Ser: 0.97 mg/dL (ref 0.57–1.00)
Glucose: 84 mg/dL (ref 70–99)
Potassium: 4.9 mmol/L (ref 3.5–5.2)
Sodium: 141 mmol/L (ref 134–144)
eGFR: 58 mL/min/1.73 — ABNORMAL LOW (ref >59)

## 2024-03-20 LAB — MAGNESIUM: Magnesium: 2 mg/dL (ref 1.6–2.3)

## 2024-03-28 NOTE — Telephone Encounter (Signed)
 Please see previous encounter

## 2024-04-05 ENCOUNTER — Other Ambulatory Visit: Payer: Self-pay

## 2024-04-05 ENCOUNTER — Emergency Department: Admission: EM | Admit: 2024-04-05 | Discharge: 2024-04-06 | Disposition: A | Discharge status: Home / Self Care

## 2024-04-05 ENCOUNTER — Emergency Department

## 2024-04-05 DIAGNOSIS — Z7901 Long term (current) use of anticoagulants: Secondary | ICD-10-CM | POA: Insufficient documentation

## 2024-04-05 DIAGNOSIS — R079 Chest pain, unspecified: Secondary | ICD-10-CM

## 2024-04-05 DIAGNOSIS — I700 Atherosclerosis of aorta: Secondary | ICD-10-CM | POA: Diagnosis not present

## 2024-04-05 DIAGNOSIS — R0689 Other abnormalities of breathing: Secondary | ICD-10-CM | POA: Insufficient documentation

## 2024-04-05 DIAGNOSIS — R0789 Other chest pain: Secondary | ICD-10-CM | POA: Diagnosis not present

## 2024-04-05 DIAGNOSIS — Z79899 Other long term (current) drug therapy: Secondary | ICD-10-CM | POA: Insufficient documentation

## 2024-04-05 DIAGNOSIS — I4891 Unspecified atrial fibrillation: Secondary | ICD-10-CM | POA: Diagnosis not present

## 2024-04-05 DIAGNOSIS — I10 Essential (primary) hypertension: Secondary | ICD-10-CM | POA: Diagnosis not present

## 2024-04-05 LAB — MAGNESIUM: Magnesium: 1.8 mg/dL (ref 1.7–2.4)

## 2024-04-05 LAB — COMPREHENSIVE METABOLIC PANEL WITH GFR
ALT: 10 U/L (ref 0–44)
AST: 27 U/L (ref 15–41)
Albumin: 3.4 g/dL — ABNORMAL LOW (ref 3.5–5.0)
Alkaline Phosphatase: 79 U/L (ref 38–126)
Anion gap: 9 (ref 5–15)
BUN: 14 mg/dL (ref 8–23)
CO2: 23 mmol/L (ref 22–32)
Calcium: 8.9 mg/dL (ref 8.9–10.3)
Chloride: 106 mmol/L (ref 98–111)
Creatinine, Ser: 0.91 mg/dL (ref 0.44–1.00)
GFR, Estimated: >60 mL/min (ref >60)
Glucose, Bld: 117 mg/dL — ABNORMAL HIGH (ref 70–99)
Potassium: 4 mmol/L (ref 3.5–5.1)
Sodium: 138 mmol/L (ref 135–145)
Total Bilirubin: 0.7 mg/dL (ref 0.0–1.2)
Total Protein: 6.9 g/dL (ref 6.5–8.1)

## 2024-04-05 LAB — CBC WITH DIFFERENTIAL/PLATELET
Abs Immature Granulocytes: 0.03 K/uL (ref 0.00–0.07)
Basophils Absolute: 0.1 K/uL (ref 0.0–0.1)
Basophils Relative: 1 %
Eosinophils Absolute: 0.2 K/uL (ref 0.0–0.5)
Eosinophils Relative: 2 %
HCT: 37.7 % (ref 36.0–46.0)
Hemoglobin: 12.2 g/dL (ref 12.0–15.0)
Immature Granulocytes: 0 %
Lymphocytes Relative: 31 %
Lymphs Abs: 2.5 K/uL (ref 0.7–4.0)
MCH: 31 pg (ref 26.0–34.0)
MCHC: 32.4 g/dL (ref 30.0–36.0)
MCV: 95.7 fL (ref 80.0–100.0)
Monocytes Absolute: 0.5 K/uL (ref 0.1–1.0)
Monocytes Relative: 7 %
Neutro Abs: 4.7 K/uL (ref 1.7–7.7)
Neutrophils Relative %: 59 %
Platelets: 224 K/uL (ref 150–400)
RBC: 3.94 MIL/uL (ref 3.87–5.11)
RDW: 15.6 % — ABNORMAL HIGH (ref 11.5–15.5)
WBC: 8 K/uL (ref 4.0–10.5)
nRBC: 0 % (ref 0.0–0.2)

## 2024-04-05 LAB — TROPONIN I (HIGH SENSITIVITY): Troponin I (High Sensitivity): 16 ng/L (ref <18)

## 2024-04-05 MED ORDER — ASPIRIN 81 MG PO CHEW
324.0000 mg | CHEWABLE_TABLET | Freq: Once | ORAL | Status: AC
  Administered 2024-04-05: 324 mg via ORAL
  Filled 2024-04-05: qty 4

## 2024-04-05 MED ORDER — DILTIAZEM LOAD VIA INFUSION
15.0000 mg | Freq: Once | INTRAVENOUS | Status: DC
  Filled 2024-04-05: qty 15

## 2024-04-05 MED ORDER — DILTIAZEM HCL-DEXTROSE 125-5 MG/125ML-% IV SOLN (PREMIX)
5.0000 mg/h | INTRAVENOUS | Status: DC
  Filled 2024-04-05: qty 125

## 2024-04-05 NOTE — ED Provider Notes (Signed)
 [facility] Provider Note    Event Date/Time   First MD Initiated Contact with Patient 04/05/24 2140     (approximate)   History   Chest Pain and Irregular Heart Beat   HPI  Charlene Coleman is a 83 y.o. female with a past medical history of atrial fibrillation on Tikosyn  and amlodipine  (amiodarone  previously discontinued secondary to thyroid  abnormality) on Eliquis  and hypertension who presents to the emergency department with chest pain and atrial fibrillation with rapid ventricular rate.  Patient states that she was in her usual state of health and has been compliant with all of her medications.  While sitting down 30 minutes prior to calling for EMS she developed chest pain and palpitations.  Chest pain radiated to her bilateral neck.  She was found to be in atrial fibrillation with rapid ventricular rate by EMS.  She received 900 cc of fluid and 10 mg of diltiazem  which dropped her heart rate to 110s but has subsequently rebounded.  Patient reports that her pain in her chest was initially a 7 out of 10 now currently a 5 out of 10.  Denies any recent illness fevers or chills      Physical Exam   Triage Vital Signs: ED Triage Vitals  Encounter Vitals Group     BP      Girls Systolic BP Percentile      Girls Diastolic BP Percentile      Boys Systolic BP Percentile      Boys Diastolic BP Percentile      Pulse      Resp      Temp      Temp src      SpO2      Weight      Height      Head Circumference      Peak Flow      Pain Score      Pain Loc      Pain Education      Exclude from Growth Chart     Most recent vital signs: Vitals:   04/05/24 2200 04/05/24 2305  BP: (!) 137/98 134/66  Pulse: 94 62  Resp: (!) 22 18  Temp:    SpO2: 99% 95%    Nursing Triage Note reviewed. Vital signs reviewed and patients oxygen saturation is normoxic  General: Patient is well nourished, well developed, awake and alert, appears uncomfortable Head:  Normocephalic and atraumatic Eyes: Normal inspection, extraocular muscles intact, no conjunctival pallor Ear, nose, throat: Normal external exam Neck: Normal range of motion Respiratory: Patient is in no respiratory distress, lungs with mild rales Cardiovascular: Patient is tachycardic, RR without murmur appreciated GI: Abd SNT with no guarding or rebound  Back: Normal inspection of the back with good strength and range of motion throughout all ext Extremities: pulses intact with good cap refills, no LE pitting edema or calf tenderness Neuro: The patient is alert and oriented to person, place, and time, appropriately conversive, with 5/5 bilat UE/LE strength, no gross motor or sensory defects noted. Coordination appears to be adequate. Skin: Warm, dry, and intact Psych: normal mood and affect, no SI or HI  ED Results / Procedures / Treatments   Labs (all labs ordered are listed, but only abnormal results are displayed) Labs Reviewed  CBC WITH DIFFERENTIAL/PLATELET - Abnormal; Notable for the following components:      Result Value   RDW 15.6 (*)    All other components within normal limits  BRAIN NATRIURETIC PEPTIDE  COMPREHENSIVE METABOLIC PANEL WITH GFR  T4, FREE  MAGNESIUM   TSH  TROPONIN I (HIGH SENSITIVITY)  TROPONIN I (HIGH SENSITIVITY)     EKG EKG and rhythm strip are interpreted by myself: 21:48  EKG: afib at heart rate of 133, normal QRS duration, QTc 503, nonspecific ST segments and T waves no ectopy EKG not consistent with Acute STEMI Rhythm strip: afib with RVR in lead II -- EKG and rhythm strip are interpreted by myself:   EKG: [Normal sinus rhythm] at heart rate of 65, normal QRS duration, QTc appears to be 400, nonspecific ST segments and T waves no ectopy EKG not consistent with Acute STEMI Rhythm strip: sinus rhythm in lead II    RADIOLOGY Xray chest: No acute abnormality on my independent review interpretation radiologist  agrees    PROCEDURES:  Critical Care performed: No  Procedures   MEDICATIONS ORDERED IN ED: Medications  diltiazem  (CARDIZEM ) 125 mg in dextrose  5% 125 mL (1 mg/mL) infusion (has no administration in time range)  aspirin  chewable tablet 324 mg (324 mg Oral Given 04/05/24 2211)     IMPRESSION / MDM / ASSESSMENT AND PLAN / ED COURSE                                Differential diagnosis includes, but is not limited to, arrhythmia, ACS, electrolyte derangement anemia   ED course: Patient arrives after receiving fluid and also bolus of diltiazem  in atrial fibrillation with RVR.  She has had 30 minutes of symptoms including chest pain.  Given her chest pain I did give her 324 of aspirin  (as this was not given by EMS).  Blood work demonstrated no anemia.  Chest x-ray was unremarkable.  Patient's heart rate did slow initially to A-fib in the 90s and consequently decision made not to follow this with a bolus of diltiazem  but rather to just initiate the drip.  However before the drip could be initiated patient did convert to normal sinus rhythm.  Patient reassessed and denies any chest pain and feels well currently.  Family at bedside and relieved.  We are awaiting the remainder of the labs.  Case signed out to oncoming physician pending remainder of the blood work   Clinical Course as of 04/05/24 2315  Fri Apr 05, 2024  2252 Troponin I (High Sensitivity): 16 Reassured [HD]  2259 Patient reassessed and now appears to be in sinus rhythm on the monitor.  Chest pain has dissipated [HD]    Clinical Course User Index [HD] Nicholaus Rolland BRAVO, MD     FINAL CLINICAL IMPRESSION(S) / ED DIAGNOSES   Final diagnoses:  Chest pain, unspecified type  Atrial fibrillation with rapid ventricular response (HCC)     Rx / DC Orders   ED Discharge Orders     None        Note:  This document was prepared using Dragon voice recognition software and may include unintentional dictation errors.    Nicholaus Rolland BRAVO, MD 04/05/24 [phone]

## 2024-04-06 LAB — BRAIN NATRIURETIC PEPTIDE: B Natriuretic Peptide: 100.9 pg/mL — ABNORMAL HIGH (ref 0.0–100.0)

## 2024-04-06 LAB — TROPONIN I (HIGH SENSITIVITY): Troponin I (High Sensitivity): 46 ng/L — ABNORMAL HIGH (ref <18)

## 2024-04-06 LAB — TSH: TSH: 2.62 u[IU]/mL (ref 0.350–4.500)

## 2024-04-06 LAB — T4, FREE: Free T4: 0.89 ng/dL (ref 0.61–1.12)

## 2024-04-06 NOTE — ED Notes (Signed)
 PT in no acute distress prior to discharge. Discharged instructions reviewed and pt stated that they understand directions. Pt has all belongings with them at time of discharge.

## 2024-04-06 NOTE — ED Provider Notes (Signed)
 Patient resting comfortably now normal sinus rhythm normal blood pressure feels very well now and eager to go home.  Completely chest pain-free.  Chest pain was associated with her heart rate, after conversion has been chest pain-free.  Has been completely compliant with medications, no other acute medical complaints or recent illnesses except for feeling lightheaded in the last couple of days.  No lightheadedness now and is ambulatory with steady gait on recheck.  I discussed the troponin elevation in the 40s which could be attributable to her RVR versus ACS given that she did have chest pain.  Her chest pain is completely resolved now.  I do think that the troponin leak may very well have been caused by her RVR, now resolved.  I did offer hospitalization, repeat troponin checks, but the patient declines stating that she would rather go home now and follow-up with her cardiologist and I think this is reasonable.  I asked that she come back immediately should she develop any new or worsening symptoms.   Cyrena Mylar, MD 04/06/24 [phone]

## 2024-04-06 NOTE — Discharge Instructions (Signed)
 Continue taking all medications as prescribed.  Please call your cardiologist for follow-up appointment this week.  Thank you for choosing us  for your health care today!  Please see your primary doctor this week for a follow up appointment.   If you have any new, worsening, or unexpected symptoms call your doctor right away or come back to the emergency department for reevaluation.  It was my pleasure to care for you today.   Ginnie EDISON Cyrena, MD

## 2024-04-06 NOTE — ED Triage Notes (Signed)
 Pt arrives via [facility] with c/o chest pain and irregular heart beat. Per EMS pt was in afib upon arrival HR in 150s.   Bolus of Diltiazem  given

## 2024-04-08 NOTE — Telephone Encounter (Signed)
 Last read by Niels LELON Rung at 10:36AM on 04/08/2024.

## 2024-04-09 ENCOUNTER — Encounter: Payer: Self-pay | Admitting: Cardiology

## 2024-04-09 ENCOUNTER — Other Ambulatory Visit: Payer: Self-pay

## 2024-04-10 NOTE — Telephone Encounter (Signed)
 Patient was seen in the ED over the weekend for chest pain and atrial fibrillatuob with heart rate in 130's. Patient would like to know from Dr. Cindie anything he thinks may have caused the atrial fibrillation and if there should be any changes to medication or plan for her.

## 2024-04-10 NOTE — Telephone Encounter (Signed)
 See other phone notes. I have spoken with the patient and appointment has been made.

## 2024-04-10 NOTE — Telephone Encounter (Signed)
 Spoke with the patient in regards to her concerns about episode of afib and elevated BP. She states that she has been feeling okay since, other than a brief episode on Saturday morning of elevated blood pressure and heart rate. She states that she has been having some shortness of breath with exertion. She has been feeling weak and dizzy but has also been dealing with vertigo. Advised to follow up with PCP in regards to vertigo. She is taking medications has prescribed. She is seeing Barnie Hila, NP next week for ER f/u. She would like to come in to discuss her options for afib with Dr. Cindie. She would be interested in changing medications or another ablation as mentioned by Suzann. Appointment has been scheduled. Return to ER precautions given.

## 2024-04-11 MED ORDER — POTASSIUM CHLORIDE CRYS ER 20 MEQ PO TBCR: 20.0000 meq | EXTENDED_RELEASE_TABLET | Freq: Every day | ORAL | 0 refills | Status: AC

## 2024-04-11 MED ORDER — FUROSEMIDE 20 MG PO TABS: 20.0000 mg | ORAL_TABLET | Freq: Every day | ORAL | 0 refills | Status: DC

## 2024-04-11 MED ORDER — IRBESARTAN 300 MG PO TABS: 300.0000 mg | ORAL_TABLET | Freq: Every day | ORAL | 0 refills | Status: DC

## 2024-04-11 NOTE — Addendum Note (Signed)
 Addended by: BLUFORD, Julliana Whitmyer L on: 04/11/2024 08:55 AM   Modules accepted: Orders

## 2024-04-11 NOTE — Telephone Encounter (Signed)
 Camellia, CMA pt's pharmacy [facility] is requesting these medications be sent to their pharmacy and these medications were sent to [facility] mail order pharmacy. Can you please readdress this request? Thanks

## 2024-04-11 NOTE — Addendum Note (Signed)
 Addended by: BLUFORD RAMP D on: 04/11/2024 08:27 AM   Modules accepted: Orders

## 2024-04-16 NOTE — Progress Notes (Unsigned)
 Cardiology Clinic Note   Date: 04/17/2024 ID: Sundeep, Cary 1941/06/16, MRN [id number]  [facility] Providers Cardiologist:  [name], MD Electrophysiologist:  [name], MD  Electrophysiology APP:  Riddle, Suzann, NP   Chief Complaint   Charlene Coleman is a 83 y.o. female who presents to the clinic today for hospital follow up.   Patient Profile   Charlene Coleman is followed by Dr. Cave and Dr. HOLTS for the history outlined below.      Past medical history significant for: Nonobstructive CAD. LHC 12/30/2021: Mild to moderate nonobstructive CAD including 20 to 30% proximal/mid LAD and D1 lesions as well as sequential 20 to 50% proximal and mid RCA stenoses. PAF. A-fib ablation 09/21/2021 and 03/21/2023. Chronic HFpEF. Echo 01/19/2023: EF 60 to 65%.  No RWMA.  Indeterminate diastolic parameters.  Normal RV size/function.  Moderate LAE.  Mild to moderate MR.  Aortic valve sclerosis without stenosis. Hyperlipidemia. Lipid panel 03/06/2024: LDL 60, HDL 49, TG 211, total 151. GERD. Hypothyroidism.  In summary, patient was previously followed by [facility] cardiology with last visit in July 2021.  She underwent LHC in February 2009 which showed nonobstructive CAD with 50% stenosis in D1 and RCA.  She had a Lexiscan in March 2021 which demonstrated mild lateral wall ischemia.  Prior cardiac monitor showed a 12.68% A-fib burden.  She was started on Cardizem  and Eliquis .  She establish care with Dr. Cave on 03/02/2020.  She reported nonspecific dyspnea and fatigue.  Echo demonstrated EF 55 to 60%, mild LVH, Grade II DD, moderately elevated PA pressure, mild to moderate MR, mild to moderate aortic valve sclerosis/calcification without stenosis.  In September 2021 she was started on amiodarone  secondary to being very symptomatic when going into A-fib.  Amiodarone  was subsequently discontinued in 2022 secondary to thyroid  dysfunction.  She established care with Dr.  HOLTS on 03/17/2021.  14-day ZIO in November 2022 demonstrated a 2% A-fib burden.  He underwent A-fib ablation in February 2023.  In May 2023 she reported symptomatic PAF associated with chest pressure.  Decision was made to proceed with diagnostic catheterization as it was also felt she would benefit from the initiation of Tikosyn .  LHC June 2023 showed mild to moderate nonobstructive CAD as detailed above.  She underwent hospital admission at the end of June 2023 for Tikosyn  loading.  In May 2024 she began complaining of dyspnea with minimal exertion impacting her quality of life.  She was found to be in a flutter.  She presented to the hospital on 12/28/2022 to undergo DCCV and was found to be in sinus bradycardia.  Upon follow-up in June 2024 she continued to complain of fatigue.  She did undergo a sleep study which did not demonstrate OSA.  She underwent repeat A-fib ablation in August 2024.  Patient was last seen in the office by Chantal Needle, NP on 03/19/2024 for routine follow-up.  She reported a 45-minute A-fib episode 3 weeks prior.  She reported this episode was different as it occurred during the day and was not accompanied by the usual back pain and choking sensation.  She continued adherence with Tikosyn  9A/9P and Eliquis .  She reported home BP in the 160-170s systolic.  BP at the time of her visit was 172/64.  She was started on amlodipine  2.5 mg daily.  Patient presented to the ED on 04/05/2024 with chest pain and irregular heartbeat.  EMS reported patient in A-fib with heart rate in the 150s upon  their arrival.  She received a bolus of diltiazem .  Initial labs: WBC 8, hemoglobin 12.2, hematocrit 37.7, sodium 138, potassium 4, creatinine 0.91, BUN 14, magnesium  1.8.  Troponin 16>> 46.  Her heart rate improved into the 90s.  She did convert to sinus rhythm prior to discharge.  It was felt troponin elevation attributed to A-fib with RVR versus ACS.  Hospital admission was offered with repeat troponin  checks but patient declined.  Patient was discharged home to follow-up with cardiology as an outpatient.     History of Present Illness    Today, patient is here alone. She is doing well since hospital discharge. She had one more afib episode on the same day she left the hospital lasting 35-40 minutes but not as severe. She explains she went to the ED for the first episode because it was somewhat different than previous episodes. She states she first developed chest and back discomfort than noted her heart racing. Usually with episodes she first notes her heart racing then develops the discomfort. The discomfort was more intense than usual. Discomfort subsided as soon as her heart rate came down with dose of diltiazem  administered by EMS. Since that time she denies any further chest pain. She has been able to perform all household activities without limitation. She reports recent episodes of vertigo unchanged from previous. She states episodes occur when she lays down at night and it feels as though the room is spinning. If she gets up in the middle of the night to use the bathroom she will feel as though she is spinning. She typically will sit for a few minutes and the sensation will resolve and she is able to get up. At onset of vertigo 3 years ago, PCP suggested she Google vestibular exercises but she was unable to perform them secondary to nausea. If vertigo episodes increase or become more persistent suggest discussing with PCP possible referral for vestibular therapy. She agrees.     ROS: All other systems reviewed and are otherwise negative except as noted in History of Present Illness.  EKGs/Labs Reviewed    EKG Interpretation Date/Time:  Wednesday April 17 2024 11:02:52 EDT Ventricular Rate:  47 PR Interval:  190 QRS Duration:  80 QT Interval:  500 QTC Calculation: 442 R Axis:   -6  Text Interpretation: Sinus bradycardia Nonspecific T wave abnormality When compared with ECG of  06-Apr-2024 00:55, No significant change was found Confirmed by Loistine Sober [phone]) on 04/17/2024 11:04:34 AM   04/05/2024: ALT 10; AST 27; BUN 14; Creatinine, Ser 0.91; Potassium 4.0; Sodium 138   04/05/2024: Hemoglobin 12.2; WBC 8.0   04/05/2024: TSH 2.620   04/05/2024: B Natriuretic Peptide 100.9    Risk Assessment/Calculations     CHA2DS2-VASc Score = 5   This indicates a 7.2% annual risk of stroke. The patient's score is based upon: CHF History: 0 HTN History: 1 Diabetes History: 0 Stroke History: 0 Vascular Disease History: 1 Age Score: 2 Gender Score: 1     STOP-Bang Score:          Physical Exam    VS:  BP (!) 142/76 (BP Location: Left Arm, Patient Position: Sitting, Cuff Size: Normal)   Pulse (!) 47   Ht 5' (1.524 m)   Wt 150 lb 3.2 oz (68.1 kg)   SpO2 99%   BMI 29.33 kg/m  , BMI Body mass index is 29.33 kg/m.  Orthostatic VS for the past 24 hrs (Last 3 readings):  BP- Lying Pulse-  Lying BP- Sitting Pulse- Sitting BP- Standing at 0 minutes Pulse- Standing at 0 minutes BP- Standing at 3 minutes Pulse- Standing at 3 minutes  04/17/24 1106 174/76 (!) 46 173/82 (!) 48 162/87 50 167/76 (!) 48     GEN: Well nourished, well developed, in no acute distress. Neck: No JVD or carotid bruits. Cardiac:  RRR.  No murmur. No rubs or gallops.   Respiratory:  Respirations regular and unlabored. Clear to auscultation without rales, wheezing or rhonchi. GI: Soft, nontender, nondistended. Extremities: Radials/DP/PT 2+ and equal bilaterally. No clubbing or cyanosis. No edema.  Skin: Warm and dry, no rash. Neuro: Strength intact.  Assessment & Plan   Nonobstructive CAD LHC June 2023 showed mild to moderate nonobstructive CAD including 20 to 30% proximal/mid LAD and D1 lesions as well as sequential 20 to 50% proximal and mid RCA stenoses.  Patient denies exertional chest pain. She reports chest and back pain during episodes of afib. Slight elevation of troponin during ED  visit on 9/5 like related to demand ischemia in the setting of afib with RVR HR in the 150s. Patient agrees to defer ischemic evaluation at this time.  - Instructed to contact the office with further episodes of chest pain.  - Continue amlodipine , atorvastatin .  No aspirin  secondary to Eliquis .  Chronic HFpEF Echo June 2024 demonstrated normal LV/RV function, mild to moderate MR.  Patient denies lower extremity edema, orthopnea or PND.  Euvolemic and well compensated on exam. - Continue irbesartan , Lasix .  PAF S/p A-fib ablation February 2023 in August 2024.  Recent ED visit 04/05/2024 with A-fib with RVR.  Patient reports one more episode of afib when she got home from ED that lasted 35-40 minutes and was less intense than earlier episode. None since.  She denies spontaneous bleeding concerns.  EKG demonstrates sinus bradycardia 47 bpm.  - Continue Eliquis , Tikosyn . Appropriate Eliquis  dose.  Continue to avoid AV nodal blocking agents secondary to bradycardia. - Follow-up with Dr. Cindie on 9/26 as previously scheduled.  Hypertension BP today 142/76. She reports episodic vertigo. Orthostatic vitals negative today. She stopped amlodipine  secondary to BP getting too low.  - Continue irbesartan .   Hyperlipidemia LDL 60 August 2025, at goal. - Continue atorvastatin .  Disposition: Keep previously scheduled visit with Dr. Cindie next week.          Signed, Barnie HERO. Chrisandra Wiemers, DNP, NP-C

## 2024-04-17 ENCOUNTER — Ambulatory Visit: Attending: Student | Admitting: Student

## 2024-04-17 ENCOUNTER — Encounter: Payer: Self-pay | Admitting: Student

## 2024-04-17 VITALS — BP 142/76 | HR 47 | Ht 60.0 in | Wt 150.2 lb

## 2024-04-17 DIAGNOSIS — I10 Essential (primary) hypertension: Secondary | ICD-10-CM | POA: Diagnosis not present

## 2024-04-17 DIAGNOSIS — I5032 Chronic diastolic (congestive) heart failure: Secondary | ICD-10-CM | POA: Diagnosis not present

## 2024-04-17 DIAGNOSIS — E785 Hyperlipidemia, unspecified: Secondary | ICD-10-CM

## 2024-04-17 DIAGNOSIS — I480 Paroxysmal atrial fibrillation: Secondary | ICD-10-CM

## 2024-04-17 DIAGNOSIS — I2510 Atherosclerotic heart disease of native coronary artery without angina pectoris: Secondary | ICD-10-CM | POA: Diagnosis not present

## 2024-04-17 NOTE — Patient Instructions (Signed)
 Medication Instructions:  Your physician recommends that you continue on your current medications as directed. Please refer to the Current Medication list given to you today.   *If you need a refill on your cardiac medications before your next appointment, please call your pharmacy*  Lab Work: None ordered at this time  If you have labs (blood work) drawn today and your tests are completely normal, you will receive your results only by: MyChart Message (if you have MyChart) OR A paper copy in the mail If you have any lab test that is abnormal or we need to change your treatment, we will call you to review the results.  Testing/Procedures: None ordered at this time   Follow-Up: At [facility], you and your health needs are our priority.  As part of our continuing mission to provide you with exceptional heart care, our providers are all part of one team.  This team includes your primary Cardiologist (physician) and Advanced Practice Providers or APPs (Physician Assistants and Nurse Practitioners) who all work together to provide you with the care you need, when you need it.  Your next appointment:    Keep your next appointment with Dr. Cindie on 04/26/24 at 08:15 am.    We recommend signing up for the patient portal called MyChart.  Sign up information is provided on this After Visit Summary.  MyChart is used to connect with patients for Virtual Visits (Telemedicine).  Patients are able to view lab/test results, encounter notes, upcoming appointments, etc.  Non-urgent messages can be sent to your provider as well.   To learn more about what you can do with MyChart, go to [email or web address].

## 2024-04-25 NOTE — Progress Notes (Unsigned)
  Electrophysiology Office Follow up Visit Note:    Date:  04/26/2024   ID:  Charlene Coleman, DOB 1941-01-29, MRN [id number]  PCP:  Alla Amis, MD  [facility] Cardiologist:  [name], MD  [facility] Electrophysiologist:  [name], MD    Interval History:     Charlene Coleman is a 83 y.o. female who presents for a follow up visit.   The patient last saw Charlene Coleman March 19, 2024.  She has had 2 prior catheter ablations for atrial fibrillation and was maintained on Tikosyn .  She is on Eliquis  for stroke prophylaxis.  At the appointment with Charlene Coleman she reported short episodes of atrial fibrillation.        Past medical, surgical, social and family history were reviewed.  ROS:   Please see the history of present illness.    All other systems reviewed and are negative.  EKGs/Labs/Other Studies Reviewed:    The following studies were reviewed today:     EKG Interpretation Date/Time:  Friday April 26 2024 07:56:41 EDT Ventricular Rate:  55 PR Interval:  184 QRS Duration:  78 QT Interval:  450 QTC Calculation: 430 R Axis:   15  Text Interpretation: Sinus bradycardia Nonspecific ST and T wave abnormality Confirmed by HOLTS OLE [phone]) on 04/26/2024 8:04:28 AM    Physical Exam:    VS:  BP 110/60 (BP Location: Left Arm, Patient Position: Sitting, Cuff Size: Normal)   Pulse (!) 55   Ht 5' (1.524 m)   Wt 142 lb (64.4 kg)   SpO2 98%   BMI 27.73 kg/m     Wt Readings from Last 3 Encounters:  04/26/24 142 lb (64.4 kg)  04/17/24 150 lb 3.2 oz (68.1 kg)  04/05/24 143 lb (64.9 kg)     GEN: no distress CARD: RRR, No MRG RESP: No IWOB. CTAB.      ASSESSMENT:    1. Persistent atrial fibrillation (HCC)   2. Encounter for long-term (current) use of high-risk medication   3. Primary hypertension    PLAN:    In order of problems listed above:  #Persistent atrial fibrillation #High risk med monitoring-Tikosyn  The patient has had  2 prior catheter ablations but continues to have episodes of atrial fibrillation that are symptomatic.  She is doing well on Tikosyn  with a QTc that is acceptable for ongoing use.  I discussed the options today including continuing with Tikosyn , redo catheter ablation and permanent pacemaker implant and AV nodal ablation.  I have recommended continue with Tikosyn  as I do not think that the yield would be significant with another ablation.  I would not recommend permanent pacing and AV nodal ablation given associated mortality data.  Continue Eliquis  for stroke prophylaxis  #Hypertension At goal today.  Recommend checking blood pressures 1-2 times per week at home and recording the values.  Recommend bringing these recordings to the primary care physician.  Follow-up 4 months with APP    Signed, OLE HOLTS, MD, [facility], [facility] 04/26/2024 8:14 AM    Electrophysiology [facility]

## 2024-04-26 ENCOUNTER — Encounter: Payer: Self-pay | Admitting: Cardiology

## 2024-04-26 ENCOUNTER — Ambulatory Visit: Attending: Cardiology | Admitting: Cardiology

## 2024-04-26 VITALS — BP 110/60 | HR 55 | Ht 60.0 in | Wt 142.0 lb

## 2024-04-26 DIAGNOSIS — I10 Essential (primary) hypertension: Secondary | ICD-10-CM

## 2024-04-26 DIAGNOSIS — Z79899 Other long term (current) drug therapy: Secondary | ICD-10-CM | POA: Diagnosis not present

## 2024-04-26 DIAGNOSIS — I4819 Other persistent atrial fibrillation: Secondary | ICD-10-CM

## 2024-04-26 NOTE — Patient Instructions (Signed)
 Medication Instructions:  Your physician recommends that you continue on your current medications as directed. Please refer to the Current Medication list given to you today.  *If you need a refill on your cardiac medications before your next appointment, please call your pharmacy*  Follow-Up: At [facility], you and your health needs are our priority.  As part of our continuing mission to provide you with exceptional heart care, our providers are all part of one team.  This team includes your primary Cardiologist (physician) and Advanced Practice Providers or APPs (Physician Assistants and Nurse Practitioners) who all work together to provide you with the care you need, when you need it.  Your next appointment:   4 months  Provider:   You will see one of the following Advanced Practice Providers on your designated Care Team:   Charlies Arthur, [place] Jodie Passey, PA-C Suzann Riddle, NP Daphne Barrack, NP Artist Pouch, PA-C

## 2024-05-02 ENCOUNTER — Other Ambulatory Visit: Payer: Self-pay | Admitting: Cardiology

## 2024-05-03 MED ORDER — IRBESARTAN 300 MG PO TABS: 300.0000 mg | ORAL_TABLET | Freq: Every day | ORAL | 3 refills | Status: AC

## 2024-05-06 ENCOUNTER — Other Ambulatory Visit: Payer: Self-pay

## 2024-05-08 ENCOUNTER — Ambulatory Visit: Attending: Family Medicine

## 2024-05-08 DIAGNOSIS — R42 Dizziness and giddiness: Secondary | ICD-10-CM | POA: Insufficient documentation

## 2024-05-08 DIAGNOSIS — R2681 Unsteadiness on feet: Secondary | ICD-10-CM | POA: Diagnosis not present

## 2024-05-08 MED ORDER — FUROSEMIDE 20 MG PO TABS: 20.0000 mg | ORAL_TABLET | Freq: Every day | ORAL | 3 refills | Status: AC

## 2024-05-08 NOTE — Therapy (Signed)
 OUTPATIENT PHYSICAL THERAPY EVALUATION  Patient Name: Charlene Coleman MRN: [id number] DOB:[birth year], 83 y.o., female Today's Date: 05/08/2024  PCP: Alla Amis, MD REFERRING PROVIDER: Alla Amis, MD   END OF SESSION:  PT End of Session - 05/08/24 1105     Visit Number 1    Number of Visits 12    Date for Recertification  06/19/24    Authorization Type BCBS    Progress Note Due on Visit 10    PT Start Time 1102    PT Stop Time 1142    PT Time Calculation (min) 40 min    Equipment Utilized During Treatment Gait belt    Activity Tolerance Patient tolerated treatment well;No increased pain;Patient limited by fatigue    Behavior During Therapy [facility] for tasks assessed/performed          Past Medical History:  Diagnosis Date   (HFpEF) heart failure with preserved ejection fraction (HCC)    a. 06/2021 Echo: EF 60-65%, no rwma, mild asymm LVH of basal-septal segment. Nl RV fxn. RVSP 23.57mmHg. Mild MR. AoV sclerosis w/o stenosis.   Aortic valve sclerosis    a. 06/2021 Echo: Ao sclerosis w/o stenosis.   Borderline diabetes    Breast cancer (HCC) 2015   Left side - radiation - lumpectomy   CAD (coronary artery disease)    a. 12/2021 Cath: LM nl, LAD 25p/m, D1 25, RI nl, LCX nl, RCA 20/30p, 3m->Med rx.   DCIS (ductal carcinoma in situ)    Dyspnea    Dysrhythmia    Empty sella syndrome    GERD (gastroesophageal reflux disease)    Gout    Hyperlipidemia    Hypertension    PAF (paroxysmal atrial fibrillation) (HCC)    a. Amio stopped 2/2 thyroid  dysfunction; b. 09/2021 s/p PVI.   Personal history of radiation therapy    Skin cancer 2011   Past Surgical History:  Procedure Laterality Date   ABDOMINAL HYSTERECTOMY     ATRIAL FIBRILLATION ABLATION N/A 09/21/2021   Procedure: ATRIAL FIBRILLATION ABLATION;  Surgeon: Cindie Ole DASEN, MD;  Location: [facility];  Service: Cardiovascular;  Laterality: N/A;   ATRIAL FIBRILLATION ABLATION N/A 03/21/2023    Procedure: ATRIAL FIBRILLATION ABLATION;  Surgeon: Cindie Ole DASEN, MD;  Location: [facility];  Service: Cardiovascular;  Laterality: N/A;   BREAST BIOPSY Left 2015   positive   BREAST LUMPECTOMY Left 09/23/2013   CARDIOVERSION N/A 12/28/2022   Procedure: CARDIOVERSION;  Surgeon: Darliss Rogue, MD;  Location: [facility];  Service: Cardiovascular;  Laterality: N/A;   CATARACT EXTRACTION Bilateral    CHOLECYSTECTOMY     ELECTROPHYSIOLOGIC STUDY     GASTRIC FUNDOPLICATION     JOINT REPLACEMENT Right 09/22/2020   R hip replacement   LEFT HEART CATH AND CORONARY ANGIOGRAPHY N/A 12/30/2021   Procedure: LEFT HEART CATH AND CORONARY ANGIOGRAPHY;  Surgeon: Mady Bruckner, MD;  Location: [facility];  Service: Cardiovascular;  Laterality: N/A;   left lumpectomy     Patient Active Problem List   Diagnosis Date Noted   Diastolic heart failure (HCC) 05/17/2023   Dyslipidemia 05/17/2023   Gout 05/17/2023   Chronic lumbar radiculopathy 07/26/2022   A-fib (HCC) 01/24/2022   Chest pain    Hypercoagulable state due to paroxysmal atrial fibrillation (HCC) 10/19/2021   Acquired hypothyroidism 09/14/2020   CAD (coronary artery disease), native coronary artery 03/07/2017   Empty sella syndrome 03/07/2017   Benign essential hypertension 03/07/2017   Hallux valgus, acquired 03/07/2017  H/O: gout 03/07/2017   Mixed hyperlipidemia 03/07/2017   Non morbid obesity due to excess calories 03/07/2017   Other hammer toe (acquired) 03/07/2017   Synovitis and tenosynovitis 03/07/2017   Medicare annual wellness visit, initial 01/09/2017   Medicare annual wellness visit, subsequent 01/09/2017   Osteopenia of multiple sites 01/09/2017   Vaccine counseling 01/09/2017   Personal history of other malignant neoplasm of skin 11/11/2015   Ductal carcinoma in situ (DCIS) of left breast 02/01/2015   Dysphagia 07/16/2014   FH: colon polyps 07/16/2014   Cancer of breast (HCC) 03/14/2014   GERD  (gastroesophageal reflux disease) 03/14/2014   H/O cardiac catheterization 03/14/2014    ONSET DATE: September 2025  REFERRING DIAG: Vertigo   THERAPY DIAG:  Unsteadiness on feet  Dizziness and giddiness  Rationale for Evaluation and Treatment: REHABILITATION  SUBJECTIVE:                                                                                                                                                                                             SUBJECTIVE STATEMENT: Pt has been having short episodes of spinning dizziness mostly associated with getting into bed at night, rolling in bed during night, or getting out of bed in morning or over night for BR trips. Pt says this happened during a recent cardiology visit when she was positioning for US . Pt says this all started no long after a recent trip/fall and impact of Left cheek bone on a piece of furniture (no injury sustained.) Pt says episodes have progressively decreased in severity, but she still have some mild triggering with cervical extension when rinsing her hair in shower. Pt has taken no antiemetics or antidizzy meds. Pt denies any other presyncopal prodrome, visual changes.   Pt accompanied by: self  PERTINENT HISTORY:  Pt has been having short episodes of spinning dizziness mostly associated with getting into bed at night, rolling in bed during night, or getting out of bed in morning or over night for BR trips. Pt says this happened during a recent cardiology visit when she was positioning for US . Pt says this all started no long after a recent trip/fall and impact of Left cheek bone on a piece of furniture (no injury sustained.) Pt says episodes have progressively decreased in severity, but she still have some mild triggering with cervical extension when rinsing her hair in shower. Pt has taken no antiemetics or antidizzy meds. Pt denies any other presyncopal prodrome, visual changes. Pt AMB with [facility] but denies any  regular falls or close calls aside from recent event.  PAIN:  Are you having pain? No  PRECAUTIONS: None  WEIGHT BEARING RESTRICTIONS: No  FALLS: Has patient fallen in last 6 months? Yes 1   PATIENT GOALS: Stop being dizzy   OBJECTIVE:  Note: Objective measures were completed at Evaluation unless otherwise noted.  DIAGNOSTIC FINDINGS:   Left dix hallpike negative, Right negative, Left roll test negative; right negative (no symptoms or aberrant eye movements with any of these)   Orthostatic VS:  185/72 53bpm supine  164/69 53bpm supine  161/81 55bpm seated (normal typical mild, brief dizziness, no vertiginous or akin to CC)  156/77 55bpm standing 166/88 58bpm standing x1 minutes                                                                                                                  TREATMENT DATE:    PATIENT EDUCATION: Education details: Need to assess with light suppressed IR goggles for definitive R/O Person educated: Patient  Education method: Explanation  Education comprehension: discussion   HOME EXERCISE PROGRAM: None   GOALS: Goals reviewed with patient?   SHORT TERM GOALS: Target date:   Pt to report no longer having dizziness episodes when performing be mobility.  Baseline: Goal status: INITIAL  2.  Pt to report no dizziness triggers when washing hair.  Baseline:  Goal status: INITIAL   ASSESSMENT:  CLINICAL IMPRESSION: 83yoF evaluated for acute onset dizziness episodes following a fall at home and impact to left face. Subjective report very much consistent with vertigo and furthermore BPPV. Testing of horizonal and posterior canals unremarkable today. Orthostatic vitals also unremarkable. Recommend pt repeat canal tests with IR goggles for greatest level of objective assessment and decide on treatment/assessment algorithm thereafter. Reagardless of findings, problem does seem to be gradually resolving since onset. Patient will benefit from  skilled physical therapy intervention to reduce deficits and impairments identified in evaluation, in order to reduce pain, improve quality of life, and maximize activity tolerance for ADL, IADL, and leisure/fitness. Physical therapy will help pt achieve long and short term goals of care.   OBJECTIVE IMPAIRMENTS: Decreased knowledge of condition, decreased use of DME, decreased mobility, difficulty walking, decreased strength, decreased ROM. ACTIVITY LIMITATIONS: Lifting, standing, walking, squatting, transfers, locomotion level PARTICIPATION LIMITATIONS: Cleaning, laundry, interpersonal relationships, driving, yardwork, community activity.  PERSONAL FACTORS: Age, behavior pattern, education, past/current experiences, transportation, profession  are also affecting patient's functional outcome.  REHAB POTENTIAL: Good CLINICAL DECISION MAKING: Medium  EVALUATION COMPLEXITY: Moderate    PLAN:  PT FREQUENCY: 1-2x/week PT DURATION: 8 weeks PLANNED INTERVENTIONS: 97110-Therapeutic exercises, 97530- Therapeutic activity, 97112- Neuromuscular re-education, 97535- Self Care, 02859- Manual therapy, [phone]- Gait training, [phone]- Electrical stimulation (manual), Patient/Family education, Balance training, Stair training, Spinal manipulation, Spinal mobilization, Vestibular training, Visual/preceptual remediation/compensation, Wheelchair mobility training, and Moist heat  PLAN FOR NEXT SESSION: Retest canals with IR goggles    Charmane Protzman C, PT 05/08/2024, 11:08 AM  1:00 PM, 05/08/24 Peggye JAYSON Linear, PT, DPT Physical Therapist - [place] [facility]  Outpatient Physical Therapy- [facility] [phone]

## 2024-05-09 ENCOUNTER — Ambulatory Visit: Admitting: Physical Therapy

## 2024-05-09 DIAGNOSIS — R42 Dizziness and giddiness: Secondary | ICD-10-CM

## 2024-05-09 DIAGNOSIS — R2681 Unsteadiness on feet: Secondary | ICD-10-CM | POA: Diagnosis not present

## 2024-05-09 NOTE — Therapy (Signed)
 OUTPATIENT PHYSICAL THERAPY TREATMENT  Patient Name: Charlene Coleman MRN: [id number] DOB:March 24, 1941, 83 y.o., female Today's Date: 05/09/2024  PCP: Alla Amis, MD REFERRING PROVIDER: Alla Amis, MD   END OF SESSION:   PT End of Session - 05/09/24 0846     Visit Number 2    Number of Visits 12    Date for Recertification  06/19/24    Authorization Type BCBS    Progress Note Due on Visit 10    PT Start Time 0847    PT Stop Time 0940    PT Time Calculation (min) 53 min    Activity Tolerance Patient tolerated treatment well    Behavior During Therapy [facility] for tasks assessed/performed           Past Medical History:  Diagnosis Date   (HFpEF) heart failure with preserved ejection fraction (HCC)    a. 06/2021 Echo: EF 60-65%, no rwma, mild asymm LVH of basal-septal segment. Nl RV fxn. RVSP 23.71mmHg. Mild MR. AoV sclerosis w/o stenosis.   Aortic valve sclerosis    a. 06/2021 Echo: Ao sclerosis w/o stenosis.   Borderline diabetes    Breast cancer (HCC) 2015   Left side - radiation - lumpectomy   CAD (coronary artery disease)    a. 12/2021 Cath: LM nl, LAD 25p/m, D1 25, RI nl, LCX nl, RCA 20/30p, 48m->Med rx.   DCIS (ductal carcinoma in situ)    Dyspnea    Dysrhythmia    Empty sella syndrome    GERD (gastroesophageal reflux disease)    Gout    Hyperlipidemia    Hypertension    PAF (paroxysmal atrial fibrillation) (HCC)    a. Amio stopped 2/2 thyroid  dysfunction; b. 09/2021 s/p PVI.   Personal history of radiation therapy    Skin cancer 2011   Past Surgical History:  Procedure Laterality Date   ABDOMINAL HYSTERECTOMY     ATRIAL FIBRILLATION ABLATION N/A 09/21/2021   Procedure: ATRIAL FIBRILLATION ABLATION;  Surgeon: Cindie Ole DASEN, MD;  Location: [facility];  Service: Cardiovascular;  Laterality: N/A;   ATRIAL FIBRILLATION ABLATION N/A 03/21/2023   Procedure: ATRIAL FIBRILLATION ABLATION;  Surgeon: Cindie Ole DASEN, MD;  Location: [facility];  Service: Cardiovascular;  Laterality: N/A;   BREAST BIOPSY Left 2015   positive   BREAST LUMPECTOMY Left 09/23/2013   CARDIOVERSION N/A 12/28/2022   Procedure: CARDIOVERSION;  Surgeon: Darliss Rogue, MD;  Location: [facility] OR[facility];  Service: Cardiovascular;  Laterality: N/A;   CATARACT EXTRACTION Bilateral    CHOLECYSTECTOMY     ELECTROPHYSIOLOGIC STUDY     GASTRIC FUNDOPLICATION     JOINT REPLACEMENT Right 09/22/2020   R hip replacement   LEFT HEART CATH AND CORONARY ANGIOGRAPHY N/A 12/30/2021   Procedure: LEFT HEART CATH AND CORONARY ANGIOGRAPHY;  Surgeon: Mady Bruckner, MD;  Location: [facility];  Service: Cardiovascular;  Laterality: N/A;   left lumpectomy     Patient Active Problem List   Diagnosis Date Noted   Diastolic heart failure (HCC) 05/17/2023   Dyslipidemia 05/17/2023   Gout 05/17/2023   Chronic lumbar radiculopathy 07/26/2022   A-fib (HCC) 01/24/2022   Chest pain    Hypercoagulable state due to paroxysmal atrial fibrillation (HCC) 10/19/2021   Acquired hypothyroidism 09/14/2020   CAD (coronary artery disease), native coronary artery 03/07/2017   Empty sella syndrome 03/07/2017   Benign essential hypertension 03/07/2017   Hallux valgus, acquired 03/07/2017   H/O: gout 03/07/2017   Mixed hyperlipidemia 03/07/2017  Non morbid obesity due to excess calories 03/07/2017   Other hammer toe (acquired) 03/07/2017   Synovitis and tenosynovitis 03/07/2017   Medicare annual wellness visit, initial 01/09/2017   Medicare annual wellness visit, subsequent 01/09/2017   Osteopenia of multiple sites 01/09/2017   Vaccine counseling 01/09/2017   Personal history of other malignant neoplasm of skin 11/11/2015   Ductal carcinoma in situ (DCIS) of left breast 02/01/2015   Dysphagia 07/16/2014   FH: colon polyps 07/16/2014   Cancer of breast (HCC) 03/14/2014   GERD (gastroesophageal reflux disease) 03/14/2014   H/O cardiac catheterization 03/14/2014    ONSET  DATE: September 2025  REFERRING DIAG: Vertigo   THERAPY DIAG:  Unsteadiness on feet  Dizziness and giddiness  Rationale for Evaluation and Treatment: REHABILITATION  SUBJECTIVE:                                                                                                                                                                                             SUBJECTIVE STATEMENT:  Pt reports that she is doing good this morning; overall gotten better in the last 2 weeks regarding dizziness. Less room spinning, still feeling dizzy. Like the room moved when it was really bad. Pt reports getting in & out of bed on the L side. Pt states that dizziness has been less frequent and less intense. Has had vertigo multiple times over the last few years, lasting several weeks then going away. Washing hair in shower, holding head back with eye drops increase symptoms.   Pt has been having short episodes of spinning dizziness mostly associated with getting into bed at night, rolling in bed during night, or getting out of bed in morning or over night for BR trips. Pt says this happened during a recent cardiology visit when she was positioning for US . Pt says this all started no long after a recent trip/fall and impact of Left cheek bone on a piece of furniture (no injury sustained.) Pt says episodes have progressively decreased in severity, but she still have some mild triggering with cervical extension when rinsing her hair in shower. Pt has taken no antiemetics or antidizzy meds. Pt denies any other presyncopal prodrome, visual changes.   Pt accompanied by: self  PERTINENT HISTORY:  Pt has been having short episodes of spinning dizziness mostly associated with getting into bed at night, rolling in bed during night, or getting out of bed in morning or over night for BR trips. Pt says this happened during a recent cardiology visit when she was positioning for US . Pt says this all started no long after a  recent trip/fall and  impact of Left cheek bone on a piece of furniture (no injury sustained.) Pt says episodes have progressively decreased in severity, but she still have some mild triggering with cervical extension when rinsing her hair in shower. Pt has taken no antiemetics or antidizzy meds. Pt denies any other presyncopal prodrome, visual changes. Pt AMB with [facility] but denies any regular falls or close calls aside from recent event.  PAIN:  Are you having pain? No   PRECAUTIONS: None  WEIGHT BEARING RESTRICTIONS: No  FALLS: Has patient fallen in last 6 months? Yes 1   PATIENT GOALS: Stop being dizzy   OBJECTIVE:  Note: Objective measures were completed at Evaluation unless otherwise noted.  DIAGNOSTIC FINDINGS:   Left dix hallpike negative, Right negative, Left roll test negative; right negative (no symptoms or aberrant eye movements with any of these)   Orthostatic VS:  185/72 53bpm supine  164/69 53bpm supine  161/81 55bpm seated (normal typical mild, brief dizziness, no vertiginous or akin to CC)  156/77 55bpm standing 166/88 58bpm standing x1 minutes                                                                                                                  TREATMENT DATE: 05/09/2024  For all positional testing/repositioning maneuvers, vestibular goggles donned.   Loaded L/R Dix-Hallpike:  LEFT Loaded Dix-Hallpike: negative for nystagmus, reports whooziness rated as 2/10 Repeated 2nd time: negative for nystagmus and for symptoms RIGHT Loaded Dix-Hallpike: delayed onset at 45seconds, subtle R upbeat torsional nystagmus lasting 40 sec with symptoms rated as 5-6/10 Performed Epley/CRM maneuver with pt reporting symptoms when going into L sidelying position lasting <10seconds Repeated RIGHT Loaded Dix-Hallpike: 15 sec delayed onset, R upbeat, torsional nystagmus lasting ~60sec with pt reporting symptoms 5/10 Repeated Epley/CRM maneuver with pt again reporting symptoms when  rolling onto L side  Reports minor whoozy sensation when sitting upright  When lying back to perform horizontal canal testing pt immediately reported onset of symptoms, faint R upbeat torsional nystagmus lasting ~45 sec Noted increased R upbeat torsional upon sitting up from supine as well with pt report of symptoms  Repeated R Dix-Hallpike: negative nystagmus & symptom Immediately upon sitting up, noted R upbeat torsional nystagmus with pt report of symptoms lasting ~40 sec., 8/10 severity Repeated R Dix-Hallpike: negative for symptoms & nystagmus Performed Epley/CRM for R posterior canal  Nystagmus with L head turn, L downbeat torsional again symptomatic with L sidelying position Pt provided education BPPV, handouts provided.    PATIENT EDUCATION: Education details: Need to assess with light suppressed IR goggles for definitive R/O Person educated: Patient  Education method: Explanation  Education comprehension: discussion   HOME EXERCISE PROGRAM: None   GOALS: Goals reviewed with patient?   SHORT TERM GOALS: Target date:   Pt to report no longer having dizziness episodes when performing be mobility.  Baseline: Goal status: INITIAL  2.  Pt to report no dizziness triggers when washing hair.  Baseline:  Goal status: INITIAL   ASSESSMENT:  CLINICAL IMPRESSION:  Positional testing completed this date with vestibular goggles donned. Negative L loaded Dix-Hallpike. Positive R loaded Dix-Hallpike, delayed onset at ~45 sec. with R upbeat torsional nystagmus lasting 40 sec; consistent with right posterior canal. Completed R Epley/CRM. Repeated R loaded Dix-Hallpike; positive with 15 sec delayed onset, R upbeat, torsional nystagmus lasting ~60sec. Repeated Epley/CRM. When attempting to complete positional testing for horizontal canals, pt with dizziness when going from sitting to supine,faint R upbeat torsional nystagmus lasting ~45 sec. Negative repeat R Dix-Hallpike; pt with  increased severity in symptoms when sitting up with R upbeat torsional nystagmus lasting 40 sec. Completed additional Epley/CRM; L downbeat torsional nystagmus with L head turn & symptomatic with L sidelying position. Plan to repeat positional testing & CRM as appropriate next session. The pt will benefit from further skilled PT to improve positional and movement evoked dizziness and balance in order to return pt to PLOF, decrease fall risk, and increase QOL.   OBJECTIVE IMPAIRMENTS: Decreased knowledge of condition, decreased use of DME, decreased mobility, difficulty walking, decreased strength, decreased ROM. ACTIVITY LIMITATIONS: Lifting, standing, walking, squatting, transfers, locomotion level PARTICIPATION LIMITATIONS: Cleaning, laundry, interpersonal relationships, driving, yardwork, community activity.  PERSONAL FACTORS: Age, behavior pattern, education, past/current experiences, transportation, profession  are also affecting patient's functional outcome.  REHAB POTENTIAL: Good CLINICAL DECISION MAKING: Medium  EVALUATION COMPLEXITY: Moderate    PLAN:  PT FREQUENCY: 1-2x/week PT DURATION: 8 weeks PLANNED INTERVENTIONS: 97110-Therapeutic exercises, 97530- Therapeutic activity, 97112- Neuromuscular re-education, 97535- Self Care, 02859- Manual therapy, [phone]- Gait training, [phone]- Electrical stimulation (manual), Patient/Family education, Balance training, Stair training, Spinal manipulation, Spinal mobilization, Vestibular training, Visual/preceptual remediation/compensation, Wheelchair mobility training, and Moist heat  PLAN FOR NEXT SESSION:  Retest canals with IR goggles, CRM as appropriate.    R posterior canal testing (R Dix-Hallpike) L anterior canal testing   Chiquita Silvan, SPT Physical Therapy Student - [facility] [place], [place] 05/09/2024, 9:42 AM  9:42 AM, 05/09/24

## 2024-05-13 ENCOUNTER — Ambulatory Visit

## 2024-05-16 ENCOUNTER — Ambulatory Visit: Admitting: Physical Therapy

## 2024-05-16 DIAGNOSIS — R42 Dizziness and giddiness: Secondary | ICD-10-CM

## 2024-05-16 DIAGNOSIS — R2681 Unsteadiness on feet: Secondary | ICD-10-CM | POA: Diagnosis not present

## 2024-05-16 NOTE — Therapy (Signed)
 OUTPATIENT PHYSICAL THERAPY TREATMENT  Patient Name: Charlene Coleman MRN: [id number] DOB:23-Aug-1940, 83 y.o., female Today's Date: 05/16/2024  PCP: Alla Amis, MD REFERRING PROVIDER: Alla Amis, MD   END OF SESSION:   PT End of Session - 05/16/24 0936     Visit Number 3    Number of Visits 12    Date for Recertification  06/19/24    Authorization Type BCBS    Progress Note Due on Visit 10    PT Start Time 0935    PT Stop Time 1015    PT Time Calculation (min) 40 min    Activity Tolerance Patient tolerated treatment well    Behavior During Therapy [facility] for tasks assessed/performed            Past Medical History:  Diagnosis Date   (HFpEF) heart failure with preserved ejection fraction (HCC)    a. 06/2021 Echo: EF 60-65%, no rwma, mild asymm LVH of basal-septal segment. Nl RV fxn. RVSP 23.63mmHg. Mild MR. AoV sclerosis w/o stenosis.   Aortic valve sclerosis    a. 06/2021 Echo: Ao sclerosis w/o stenosis.   Borderline diabetes    Breast cancer (HCC) 2015   Left side - radiation - lumpectomy   CAD (coronary artery disease)    a. 12/2021 Cath: LM nl, LAD 25p/m, D1 25, RI nl, LCX nl, RCA 20/30p, 28m->Med rx.   DCIS (ductal carcinoma in situ)    Dyspnea    Dysrhythmia    Empty sella syndrome    GERD (gastroesophageal reflux disease)    Gout    Hyperlipidemia    Hypertension    PAF (paroxysmal atrial fibrillation) (HCC)    a. Amio stopped 2/2 thyroid  dysfunction; b. 09/2021 s/p PVI.   Personal history of radiation therapy    Skin cancer 2011   Past Surgical History:  Procedure Laterality Date   ABDOMINAL HYSTERECTOMY     ATRIAL FIBRILLATION ABLATION N/A 09/21/2021   Procedure: ATRIAL FIBRILLATION ABLATION;  Surgeon: Cindie Ole DASEN, MD;  [facility] IN[facility];  Service: Cardiovascular;  Laterality: N/A;   ATRIAL FIBRILLATION ABLATION N/A 03/21/2023   Procedure: ATRIAL FIBRILLATION ABLATION;  Surgeon: Cindie Ole DASEN, MD;  Location: [facility] INVAS[facility]  CV LAB;  Service: Cardiovascular;  Laterality: N/A;   BREAST BIOPSY Left 2015   positive   BREAST LUMPECTOMY Left 09/23/2013   CARDIOVERSION N/A 12/28/2022   Procedure: CARDIOVERSION;  Surgeon: Darliss Rogue, MD;  Location: [facility] ORS;  Service: Cardiovascular;  Laterality: N/A;   CATARACT EXTRACTION Bilateral    CHOLECYSTECTOMY     ELECTROPHYSIOLOGIC STUDY     GASTRIC FUNDOPLICATION     JOINT REPLACEMENT Right 09/22/2020   R hip replacement   LEFT HEART CATH AND CORONARY ANGIOGRAPHY N/A 12/30/2021   Procedure: LEFT HEART CATH AND CORONARY ANGIOGRAPHY;  Surgeon: Mady Bruckner, MD;  Location: [facility] INVASIVE CV LAB;  Service: Cardiovascular;  Laterality: N/A;   left lumpectomy     Patient Active Problem List   Diagnosis Date Noted   Diastolic heart failure (HCC) 05/17/2023   Dyslipidemia 05/17/2023   Gout 05/17/2023   Chronic lumbar radiculopathy 07/26/2022   A-fib (HCC) 01/24/2022   Chest pain    Hypercoagulable state due to paroxysmal atrial fibrillation (HCC) 10/19/2021   Acquired hypothyroidism 09/14/2020   CAD (coronary artery disease), native coronary artery 03/07/2017   Empty sella syndrome 03/07/2017   Benign essential hypertension 03/07/2017   Hallux valgus, acquired 03/07/2017   H/O: gout 03/07/2017   Mixed hyperlipidemia 03/07/2017  Non morbid obesity due to excess calories 03/07/2017   Other hammer toe (acquired) 03/07/2017   Synovitis and tenosynovitis 03/07/2017   Medicare annual wellness visit, initial 01/09/2017   Medicare annual wellness visit, subsequent 01/09/2017   Osteopenia of multiple sites 01/09/2017   Vaccine counseling 01/09/2017   Personal history of other malignant neoplasm of skin 11/11/2015   Ductal carcinoma in situ (DCIS) of left breast 02/01/2015   Dysphagia 07/16/2014   FH: colon polyps 07/16/2014   Cancer of breast (HCC) 03/14/2014   GERD (gastroesophageal reflux disease) 03/14/2014   H/O cardiac catheterization 03/14/2014    ONSET  DATE: September 2025  REFERRING DIAG: Vertigo   THERAPY DIAG:  Unsteadiness on feet  Dizziness and giddiness  Rationale for Evaluation and Treatment: REHABILITATION  SUBJECTIVE:                                                                                                                                                                                             SUBJECTIVE STATEMENT:   Pt reports that she is feeling extra dizzy this morning. Pt reporting that throughout the night if she woke up, her head felt like it was spinning. Pt stating this is the Worst episode I have had in several weeks. Pt reporting that is worse when turning her head to the R, more than to the L. Pt describing that her head feels heavy. Dizziness this morning was rated at 5-6/10 severity at least.   No problems with dizziness while she was at [facility], but she didn't have to do much walking while on the trip. Pt stating that she didn't have any dizziness on flight.    From initial eval:  Pt has been having short episodes of spinning dizziness mostly associated with getting into bed at night, rolling in bed during night, or getting out of bed in morning or over night for BR trips. Pt says this happened during a recent cardiology visit when she was positioning for US . Pt says this all started no long after a recent trip/fall and impact of Left cheek bone on a piece of furniture (no injury sustained.) Pt says episodes have progressively decreased in severity, but she still have some mild triggering with cervical extension when rinsing her hair in shower. Pt has taken no antiemetics or antidizzy meds. Pt denies any other presyncopal prodrome, visual changes.   Pt accompanied by: self  PERTINENT HISTORY:  Pt has been having short episodes of spinning dizziness mostly associated with getting into bed at night, rolling in bed during night, or getting out of bed in morning or over night for BR trips.  Pt says this  happened during a recent cardiology visit when she was positioning for US . Pt says this all started no long after a recent trip/fall and impact of Left cheek bone on a piece of furniture (no injury sustained.) Pt says episodes have progressively decreased in severity, but she still have some mild triggering with cervical extension when rinsing her hair in shower. Pt has taken no antiemetics or antidizzy meds. Pt denies any other presyncopal prodrome, visual changes. Pt AMB with [facility] but denies any regular falls or close calls aside from recent event.  PAIN:  Are you having pain? No   PRECAUTIONS: None  WEIGHT BEARING RESTRICTIONS: No  FALLS: Has patient fallen in last 6 months? Yes 1   PATIENT GOALS: Stop being dizzy   OBJECTIVE:  Note: Objective measures were completed at Evaluation unless otherwise noted.  DIAGNOSTIC FINDINGS:   Left dix hallpike negative, Right negative, Left roll test negative; right negative (no symptoms or aberrant eye movements with any of these)   Orthostatic VS:  185/72 53bpm supine  164/69 53bpm supine  161/81 55bpm seated (normal typical mild, brief dizziness, no vertiginous or akin to CC)  156/77 55bpm standing 166/88 58bpm standing x1 minutes                                                                                                                  TREATMENT DATE: 05/16/2024  For all positional testing/repositioning maneuvers, vestibular goggles donned.   When she sits up in the morning the back of her head feels heavy which serves as Indication that I am going to be dizzy.  Dizziness described as room spinning, head bobbing    R dix-hallpike: pt denying symptoms, no visible nystagmus when lying back for position. Upon sitting up, L upbeat torsional nystagmus noted lasting 10 sec., 4-5/10 severity dizziness, lasting 30 sec. Reports to be concordant dizziness from this morning. Transitions to potential pure L beat nystagmus that potential decreased  with light.   R-loaded Dix-Hallpike: when in loaded position, pt reporting spinning sensation dizziness, downbeat nystagmus with potential L torsion, difficult to assess; appears to last 1 min 30 sec. When lying in Dix-Hallpike position, pt reporting that the back of her head began to feel heavy, but did not progress to full symptoms; no nystagmus noted. No symptoms reported when sitting up.  Repeat R loaded Dix-Hallpike:  when in loaded position, pt continuing to report that room was spinning, now only on R side. Downbeat nystagmus with potential mild L torsion. With light, nystagmus potentially improving, but no change in pt symptoms. Pt reporting movement on R side, no longer spinning sensation.  Pt reporting less severe symptoms compared to previous.  When lying in Dix-Hallpike position, pt with no visible nystagmus, no report of symptoms.  When sitting up, pt reporting feeling wobbly without visible nystagmus.  Some dizziness reported turning off to L side of the bed, but overall feeling better than when she came in.   L Dix-hallpike: pt denying symptoms, no  visible nystagmus throughout. Repeat L Dix-Hallpike: pt denying symptoms, no visible nystagmus throughout.  Pt lying back for horizontal canal testing, pt reporting no symptoms, no visible nystagmus.  R Roll test: pt reporting very mild symptoms after ~15 sec, R upward torsional nystagmus lasting ~45 sec.  Pt reporting no symptoms when coming back to midline.  L Roll test: pt with some R beating (ageotropic) nystagmus, lasting ~20 sec, without report of symptoms.   Upon sitting on EOB at end of session, pt states she feels better than when she initially came up to sit EOB after posterior canal testing and also feeling overall better than when she came in this morning. Reports both times when seh came to sit on EOM during therapy was better than when she sat up on EOB this morning. This morning it felt like her head was flipping and  rotating towards the right.     PATIENT EDUCATION: Education details: Need to assess with light suppressed IR goggles for definitive R/O Person educated: Patient  Education method: Explanation  Education comprehension: discussion   HOME EXERCISE PROGRAM: None   GOALS: Goals reviewed with patient?   SHORT TERM GOALS: Target date:   Pt to report no longer having dizziness episodes when performing be mobility.  Baseline: Goal status: INITIAL  2.  Pt to report no dizziness triggers when washing hair.  Baseline:  Goal status: INITIAL   ASSESSMENT:  CLINICAL IMPRESSION:  Positional testing completed this date with vestibular goggles donned. Pt with L upbeat torsional nystagmus when sitting up from R Dix-Hallpike. With repeated loaded R Dix-Hallpike, in loaded position pt reporting symptoms, with downbeat nystagmus with potential L torsion that decreased with repetition. Negative L Dix-Hallpike. Pt with R upward torsional nystagmus with R roll test with very mild symptoms. Pt with some R beating, ageotropic nystagmus with L roll test. Pt reporting that her symptoms have improved within session compared to this morning. Plan next session to complete repeated CRM for R posterior canal. The pt will benefit from further skilled PT to improve positional and movement evoked dizziness and balance in order to return pt to PLOF, decrease fall risk, and increase QOL.   OBJECTIVE IMPAIRMENTS: Decreased knowledge of condition, decreased use of DME, decreased mobility, difficulty walking, decreased strength, decreased ROM. ACTIVITY LIMITATIONS: Lifting, standing, walking, squatting, transfers, locomotion level PARTICIPATION LIMITATIONS: Cleaning, laundry, interpersonal relationships, driving, yardwork, community activity.  PERSONAL FACTORS: Age, behavior pattern, education, past/current experiences, transportation, profession  are also affecting patient's functional outcome.  REHAB POTENTIAL:  Good CLINICAL DECISION MAKING: Medium  EVALUATION COMPLEXITY: Moderate    PLAN:  PT FREQUENCY: 1-2x/week PT DURATION: 8 weeks PLANNED INTERVENTIONS: 97110-Therapeutic exercises, 97530- Therapeutic activity, 97112- Neuromuscular re-education, 97535- Self Care, 02859- Manual therapy, [phone]- Gait training, [phone]- Electrical stimulation (manual), Patient/Family education, Balance training, Stair training, Spinal manipulation, Spinal mobilization, Vestibular training, Visual/preceptual remediation/compensation, Wheelchair mobility training, and Moist heat  PLAN FOR NEXT SESSION:  Oculomotor exam, VOR* Repeated CRM for R posterior canal Resting as needed  Chiquita Silvan, SPT Physical Therapy Student - [facility] [place], Student-PT 05/16/2024, 11:44 AM  11:44 AM, 05/16/24

## 2024-05-20 ENCOUNTER — Ambulatory Visit: Admitting: Physical Therapy

## 2024-05-21 ENCOUNTER — Ambulatory Visit: Admitting: Physical Therapy

## 2024-05-21 DIAGNOSIS — R42 Dizziness and giddiness: Secondary | ICD-10-CM

## 2024-05-21 DIAGNOSIS — R2681 Unsteadiness on feet: Secondary | ICD-10-CM | POA: Diagnosis not present

## 2024-05-21 NOTE — Therapy (Signed)
 OUTPATIENT PHYSICAL THERAPY TREATMENT  Patient Name: Charlene Coleman MRN: [id number] DOB:07/07/1941, 83 y.o., female Today's Date: 05/21/2024  PCP: Alla Amis, MD REFERRING PROVIDER: Alla Amis, MD   END OF SESSION:   PT End of Session - 05/21/24 1452     Visit Number 4    Number of Visits 12    Date for Recertification  06/19/24    Authorization Type BCBS    Progress Note Due on Visit 10    PT Start Time 1448    PT Stop Time 1530    PT Time Calculation (min) 42 min    Activity Tolerance Patient tolerated treatment well    Behavior During Therapy [facility] for tasks assessed/performed             Past Medical History:  Diagnosis Date   (HFpEF) heart failure with preserved ejection fraction (HCC)    a. 06/2021 Echo: EF 60-65%, no rwma, mild asymm LVH of basal-septal segment. Nl RV fxn. RVSP 23.81mmHg. Mild MR. AoV sclerosis w/o stenosis.   Aortic valve sclerosis    a. 06/2021 Echo: Ao sclerosis w/o stenosis.   Borderline diabetes    Breast cancer (HCC) 2015   Left side - radiation - lumpectomy   CAD (coronary artery disease)    a. 12/2021 Cath: LM nl, LAD 25p/m, D1 25, RI nl, LCX nl, RCA 20/30p, 24m->Med rx.   DCIS (ductal carcinoma in situ)    Dyspnea    Dysrhythmia    Empty sella syndrome    GERD (gastroesophageal reflux disease)    Gout    Hyperlipidemia    Hypertension    PAF (paroxysmal atrial fibrillation) (HCC)    a. Amio stopped 2/2 thyroid  dysfunction; b. 09/2021 s/p PVI.   Personal history of radiation therapy    Skin cancer 2011   Past Surgical History:  Procedure Laterality Date   ABDOMINAL HYSTERECTOMY     ATRIAL FIBRILLATION ABLATION N/A 09/21/2021   Procedure: ATRIAL FIBRILLATION ABLATION;  Surgeon: Cindie Ole DASEN, MD;  Location: [facility] INV[facility] CV LAB;  Service: Cardiovascular;  Laterality: N/A;   ATRIAL FIBRILLATION ABLATION N/A 03/21/2023   Procedure: ATRIAL FIBRILLATION ABLATION;  Surgeon: Cindie Ole DASEN, MD;  Location: [facility]  INVASIVE CV LAB;  Service: Cardiovascular;  Laterality: N/A;   BREAST BIOPSY Left 2015   positive   BREAST LUMPECTOMY Left 09/23/2013   CARDIOVERSION N/A 12/28/2022   Procedure: CARDIOVERSION;  Surgeon: Darliss Rogue, MD;  Location: [facility] ORS;  Service: Cardiovascular;  Laterality: N/A;   CATARACT EXTRACTION Bilateral    CHOLECYSTECTOMY     ELECTROPHYSIOLOGIC STUDY     GASTRIC FUNDOPLICATION     JOINT REPLACEMENT Right 09/22/2020   R hip replacement   LEFT HEART CATH AND CORONARY ANGIOGRAPHY N/A 12/30/2021   Procedure: LEFT HEART CATH AND CORONARY ANGIOGRAPHY;  Surgeon: Mady Bruckner, MD;  Location: [facility] INVASIVE CV LAB;  Service: Cardiovascular;  Laterality: N/A;   left lumpectomy     Patient Active Problem List   Diagnosis Date Noted   Diastolic heart failure (HCC) 05/17/2023   Dyslipidemia 05/17/2023   Gout 05/17/2023   Chronic lumbar radiculopathy 07/26/2022   A-fib (HCC) 01/24/2022   Chest pain    Hypercoagulable state due to paroxysmal atrial fibrillation (HCC) 10/19/2021   Acquired hypothyroidism 09/14/2020   CAD (coronary artery disease), native coronary artery 03/07/2017   Empty sella syndrome 03/07/2017   Benign essential hypertension 03/07/2017   Hallux valgus, acquired 03/07/2017   H/O: gout 03/07/2017   Mixed hyperlipidemia 03/07/2017  Non morbid obesity due to excess calories 03/07/2017   Other hammer toe (acquired) 03/07/2017   Synovitis and tenosynovitis 03/07/2017   Medicare annual wellness visit, initial 01/09/2017   Medicare annual wellness visit, subsequent 01/09/2017   Osteopenia of multiple sites 01/09/2017   Vaccine counseling 01/09/2017   Personal history of other malignant neoplasm of skin 11/11/2015   Ductal carcinoma in situ (DCIS) of left breast 02/01/2015   Dysphagia 07/16/2014   FH: colon polyps 07/16/2014   Cancer of breast (HCC) 03/14/2014   GERD (gastroesophageal reflux disease) 03/14/2014   H/O cardiac catheterization 03/14/2014     ONSET DATE: September 2025  REFERRING DIAG: Vertigo   THERAPY DIAG:  Unsteadiness on feet  Dizziness and giddiness  Rationale for Evaluation and Treatment: REHABILITATION  SUBJECTIVE:                                                                                                                                                                                             SUBJECTIVE STATEMENT:    Pt reporting that she is feeling overall much better; having no bad attacks. Pt reporting that she is still getting dizziness when laying down at night, experiencing some swirling. Pt states that she is sometimes experiencing dizziness symptoms when looking up.     From initial eval:  Pt has been having short episodes of spinning dizziness mostly associated with getting into bed at night, rolling in bed during night, or getting out of bed in morning or over night for BR trips. Pt says this happened during a recent cardiology visit when she was positioning for US . Pt says this all started no long after a recent trip/fall and impact of Left cheek bone on a piece of furniture (no injury sustained.) Pt says episodes have progressively decreased in severity, but she still have some mild triggering with cervical extension when rinsing her hair in shower. Pt has taken no antiemetics or antidizzy meds. Pt denies any other presyncopal prodrome, visual changes.   Pt accompanied by: self  PERTINENT HISTORY:  Pt has been having short episodes of spinning dizziness mostly associated with getting into bed at night, rolling in bed during night, or getting out of bed in morning or over night for BR trips. Pt says this happened during a recent cardiology visit when she was positioning for US . Pt says this all started no long after a recent trip/fall and impact of Left cheek bone on a piece of furniture (no injury sustained.) Pt says episodes have progressively decreased in severity, but she still have some  mild triggering with cervical extension when rinsing her hair in  shower. Pt has taken no antiemetics or antidizzy meds. Pt denies any other presyncopal prodrome, visual changes. Pt AMB with [facility] but denies any regular falls or close calls aside from recent event.  PAIN:  Are you having pain? No   PRECAUTIONS: None  WEIGHT BEARING RESTRICTIONS: No  FALLS: Has patient fallen in last 6 months? Yes 1   PATIENT GOALS: Stop being dizzy   OBJECTIVE:  Note: Objective measures were completed at Evaluation unless otherwise noted.  DIAGNOSTIC FINDINGS:   Left dix hallpike negative, Right negative, Left roll test negative; right negative (no symptoms or aberrant eye movements with any of these)   Orthostatic VS:  185/72 53bpm supine  164/69 53bpm supine  161/81 55bpm seated (normal typical mild, brief dizziness, no vertiginous or akin to CC)  156/77 55bpm standing 166/88 58bpm standing x1 minutes                                                                                                                  TREATMENT DATE: 05/21/2024 Loaded R Dix-Hallpike: no symptoms, no nystagmus.  R Epley/CRM performed.   Repeated loaded R Dix-Hallpike: no sx, no nystagmus. Epley/CRM performed.   Of note, pt reporting that when she was having dizziness, it switched from true spinning sensation (previous episodes) to more of a drift, a little up & to the L or R, depending on which way she was looking.   OCULOMOTOR EXAM:  Ocular Alignment: normal  Ocular ROM: No Limitations  Spontaneous Nystagmus: absent  Gaze-Induced Nystagmus: absent  Smooth Pursuits: noted extra eye movements with R eye when coming back to midline from the L & with all diagonals  Saccades: hypermetric/overshoots when going to L, both purely horizontal & during diagonals; pt noting that she felt like her eyes were lingering at the L targets  Cover/Cross-Cover: WNL  VESTIBULAR - OCULAR REFLEX:   Slow VOR: Normal  VOR Cancellation:  Normal  Head-Impulse Test: positive bilaterally, symmetrical. Mild corrective catchup saccades.   Dynamic Visual Acuity: Not able to be assessed Pt asymptomatic throughout oculomotor exam & VOR testing.      PATIENT EDUCATION: Education details: Need to assess with light suppressed IR goggles for definitive R/O Person educated: Patient  Education method: Explanation  Education comprehension: discussion   HOME EXERCISE PROGRAM: None   GOALS: Goals reviewed with patient?   SHORT TERM GOALS: Target date:   Pt to report no longer having dizziness episodes when performing be mobility.  Baseline: Goal status: INITIAL  2.  Pt to report no dizziness triggers when washing hair.  Baseline:  Goal status: INITIAL   ASSESSMENT:  CLINICAL IMPRESSION:  Due to pt report of some residual dizziness when laying down at night & when looking up, completed repeated loaded R Dix-Hallpike & Epley/CRM. Pt asymptomatic throughout, no nystagmus noted. Also completed oculomotor exam, VOR.  With smooth pursuits, noted extra eye movements with R eye when coming back to midline from the L & with all diagonals. With saccades, noted overshooting when going  to L, during  purely horizontal saccades & during diagonal saccades; pt noting that she felt like her eyes were lingering at the L targets. Noted positive HIT bilat, symmetrical mild corrective catchup saccades. Pt asymptomatic throughout oculomotor exam & VOR testing. The pt will benefit from further skilled PT to improve positional and movement evoked dizziness and balance in order to return pt to PLOF, decrease fall risk, and increase QOL.   OBJECTIVE IMPAIRMENTS: Decreased knowledge of condition, decreased use of DME, decreased mobility, difficulty walking, decreased strength, decreased ROM. ACTIVITY LIMITATIONS: Lifting, standing, walking, squatting, transfers, locomotion level PARTICIPATION LIMITATIONS: Cleaning, laundry, interpersonal  relationships, driving, yardwork, community activity.  PERSONAL FACTORS: Age, behavior pattern, education, past/current experiences, transportation, profession  are also affecting patient's functional outcome.  REHAB POTENTIAL: Good CLINICAL DECISION MAKING: Medium  EVALUATION COMPLEXITY: Moderate    PLAN:  PT FREQUENCY: 1-2x/week PT DURATION: 8 weeks PLANNED INTERVENTIONS: 97110-Therapeutic exercises, 97530- Therapeutic activity, 97112- Neuromuscular re-education, 97535- Self Care, 02859- Manual therapy, [phone]- Gait training, [phone]- Electrical stimulation (manual), Patient/Family education, Balance training, Stair training, Spinal manipulation, Spinal mobilization, Vestibular training, Visual/preceptual remediation/compensation, Wheelchair mobility training, and Moist heat  PLAN FOR NEXT SESSION:  Repeated testing, CRM for R posterior canal Perform provacative movements during session to assess for nystagmus  Chiquita Silvan, SPT Physical Therapy Student - [facility] [place], Student-PT 05/21/2024, 5:12 PM  5:12 PM, 05/21/24

## 2024-05-28 ENCOUNTER — Ambulatory Visit: Admitting: Physical Therapy

## 2024-05-28 DIAGNOSIS — R42 Dizziness and giddiness: Secondary | ICD-10-CM

## 2024-05-28 DIAGNOSIS — R2681 Unsteadiness on feet: Secondary | ICD-10-CM

## 2024-05-28 NOTE — Therapy (Signed)
 OUTPATIENT PHYSICAL THERAPY TREATMENT  Patient Name: Charlene Coleman MRN: [id number] DOB:1941-04-24, 83 y.o., female Today's Date: 05/28/2024  PCP: Alla Amis, MD REFERRING PROVIDER: Alla Amis, MD   END OF SESSION:   PT End of Session - 05/28/24 1147     Visit Number 5    Number of Visits 12    Date for Recertification  06/19/24    Authorization Type [employer]    Progress Note Due on Visit 10    PT Start Time 1148    PT Stop Time 1230    PT Time Calculation (min) 42 min    Activity Tolerance Patient tolerated treatment well    Behavior During Therapy WFL for tasks assessed/performed              Past Medical History:  Diagnosis Date   (HFpEF) heart failure with preserved ejection fraction (HCC)    a. 06/2021 Echo: EF 60-65%, no rwma, mild asymm LVH of basal-septal segment. Nl RV fxn. RVSP 23.48mmHg. Mild MR. AoV sclerosis w/o stenosis.   Aortic valve sclerosis    a. 06/2021 Echo: Ao sclerosis w/o stenosis.   Borderline diabetes    Breast cancer (HCC) 2015   Left side - radiation - lumpectomy   CAD (coronary artery disease)    a. 12/2021 Cath: LM nl, LAD 25p/m, D1 25, RI nl, LCX nl, RCA 20/30p, 41m->Med rx.   DCIS (ductal carcinoma in situ)    Dyspnea    Dysrhythmia    Empty sella syndrome    GERD (gastroesophageal reflux disease)    Gout    Hyperlipidemia    Hypertension    PAF (paroxysmal atrial fibrillation) (HCC)    a. Amio stopped 2/2 thyroid  dysfunction; b. 09/2021 s/p PVI.   Personal history of radiation therapy    Skin cancer 2011   Past Surgical History:  Procedure Laterality Date   ABDOMINAL HYSTERECTOMY     ATRIAL FIBRILLATION ABLATION N/A 09/21/2021   Procedure: ATRIAL FIBRILLATION ABLATION;  Surgeon: Cindie Ole DASEN, MD;  Location: [facility];  Service: Cardiovascular;  Laterality: N/A;   ATRIAL FIBRILLATION ABLATION N/A 03/21/2023   Procedure: ATRIAL FIBRILLATION ABLATION;  Surgeon: Cindie Ole DASEN, MD;  Location: [facility];  Service: Cardiovascular;  Laterality: N/A;   BREAST BIOPSY Left 2015   positive   BREAST LUMPECTOMY Left 09/23/2013   CARDIOVERSION N/A 12/28/2022   Procedure: CARDIOVERSION;  Surgeon: Darliss Rogue, MD;  Location: [facility];  Service: Cardiovascular;  Laterality: N/A;   CATARACT EXTRACTION Bilateral    CHOLECYSTECTOMY     ELECTROPHYSIOLOGIC STUDY     GASTRIC FUNDOPLICATION     JOINT REPLACEMENT Right 09/22/2020   R hip replacement   LEFT HEART CATH AND CORONARY ANGIOGRAPHY N/A 12/30/2021   Procedure: LEFT HEART CATH AND CORONARY ANGIOGRAPHY;  Surgeon: Mady Bruckner, MD;  Location: [facility];  Service: Cardiovascular;  Laterality: N/A;   left lumpectomy     Patient Active Problem List   Diagnosis Date Noted   Diastolic heart failure (HCC) 05/17/2023   Dyslipidemia 05/17/2023   Gout 05/17/2023   Chronic lumbar radiculopathy 07/26/2022   A-fib (HCC) 01/24/2022   Chest pain    Hypercoagulable state due to paroxysmal atrial fibrillation (HCC) 10/19/2021   Acquired hypothyroidism 09/14/2020   CAD (coronary artery disease), native coronary artery 03/07/2017   Empty sella syndrome 03/07/2017   Benign essential hypertension 03/07/2017   Hallux valgus, acquired 03/07/2017   H/O: gout 03/07/2017   Mixed hyperlipidemia  03/07/2017   Non morbid obesity due to excess calories 03/07/2017   Other hammer toe (acquired) 03/07/2017   Synovitis and tenosynovitis 03/07/2017   Medicare annual wellness visit, initial 01/09/2017   Medicare annual wellness visit, subsequent 01/09/2017   Osteopenia of multiple sites 01/09/2017   Vaccine counseling 01/09/2017   Personal history of other malignant neoplasm of skin 11/11/2015   Ductal carcinoma in situ (DCIS) of left breast 02/01/2015   Dysphagia 07/16/2014   FH: colon polyps 07/16/2014   Cancer of breast (HCC) 03/14/2014   GERD (gastroesophageal reflux disease) 03/14/2014   H/O cardiac catheterization 03/14/2014     ONSET DATE: September 2025  REFERRING DIAG: Vertigo   THERAPY DIAG:  Unsteadiness on feet  Dizziness and giddiness  Rationale for Evaluation and Treatment: REHABILITATION  SUBJECTIVE:                                                                                                                                                                                             SUBJECTIVE STATEMENT:    Pt with only 1 minor instance of minor dizziness when she got into bed, one night last week, potentially Thurs night. Pt reporting that she laid down flat, felt a little bit of dizziness, trialed turning her head to L with no difference in symptoms. Pt reporting worsening of symptoms when turning her head to the R. No dizziness over the weekend.   Pt reporting that she hasn't had any dizziness getting up from bed. In the shower this morning, had no dizziness when looking up.    From initial eval:  Pt has been having short episodes of spinning dizziness mostly associated with getting into bed at night, rolling in bed during night, or getting out of bed in morning or over night for BR trips. Pt says this happened during a recent cardiology visit when she was positioning for US . Pt says this all started no long after a recent trip/fall and impact of Left cheek bone on a piece of furniture (no injury sustained.) Pt says episodes have progressively decreased in severity, but she still have some mild triggering with cervical extension when rinsing her hair in shower. Pt has taken no antiemetics or antidizzy meds. Pt denies any other presyncopal prodrome, visual changes.   Pt accompanied by: self  PERTINENT HISTORY:  Pt has been having short episodes of spinning dizziness mostly associated with getting into bed at night, rolling in bed during night, or getting out of bed in morning or over night for BR trips. Pt says this happened during a recent cardiology visit when she was positioning for US . Pt  says  this all started no long after a recent trip/fall and impact of Left cheek bone on a piece of furniture (no injury sustained.) Pt says episodes have progressively decreased in severity, but she still have some mild triggering with cervical extension when rinsing her hair in shower. Pt has taken no antiemetics or antidizzy meds. Pt denies any other presyncopal prodrome, visual changes. Pt AMB with [facility] but denies any regular falls or close calls aside from recent event.  PAIN:  Are you having pain? No   PRECAUTIONS: None  WEIGHT BEARING RESTRICTIONS: No  FALLS: Has patient fallen in last 6 months? Yes 1   PATIENT GOALS: Stop being dizzy   OBJECTIVE:  Note: Objective measures were completed at Evaluation unless otherwise noted.  DIAGNOSTIC FINDINGS:   Left dix hallpike negative, Right negative, Left roll test negative; right negative (no symptoms or aberrant eye movements with any of these)   Orthostatic VS:  185/72 53bpm supine  164/69 53bpm supine  161/81 55bpm seated (normal typical mild, brief dizziness, no vertiginous or akin to CC)  156/77 55bpm standing 166/88 58bpm standing x1 minutes    OCULOMOTOR EXAM: (05/21/24)  Ocular Alignment: normal  Ocular ROM: No Limitations  Spontaneous Nystagmus: absent  Gaze-Induced Nystagmus: absent  Smooth Pursuits: noted extra eye movements with R eye when coming back to midline from the L & with all diagonals  Saccades: hypermetric/overshoots when going to L, both purely horizontal & during diagonals; pt noting that she felt like her eyes were lingering at the L targets  Cover/Cross-Cover: WNL  VESTIBULAR - OCULAR REFLEX: (05/21/24)  Slow VOR: Normal  VOR Cancellation: Normal  Head-Impulse Test: positive bilaterally, symmetrical. Mild corrective catchup saccades.   Dynamic Visual Acuity: Not able to be assessed Pt asymptomatic throughout oculomotor exam & VOR testing.                                                                                                                   TREATMENT DATE: 05/28/2024  3x repeated Loaded R Dix-Hallpike: no symptoms, no nystagmus.  1x R Epley/CRM performed due to pt report of resisdual dizziness symptoms following previous session.   When sitting up from Dix-Hallpike maneuvers, pt with some blurred vision, confirmed to be different symptom/feeling than dizziness that she originally came in with.   Reassessed orthostatics:  Supine: BP: 170/67 (98) HR: 51 Standing immediately: BP: 157/75 HR: 54   Pt reporting mild symptoms resolving after ~40 sec ; pt confirming difference in symptoms compared to vertigo symptoms.   3 min standing: BP: 165/75 HR: 57   Educated pt on POC based on symptom report at next session.   Had pt fill out [facility] retrospectively, as if filling out at initial evaluation to establish LTG for self-reported outcome measure.  THE DIZZINESS HANDICAP INVENTORY (DHI)  P1. Does looking up increase your problem? 4 = Yes  E2. Because of your problem, do you feel frustrated? 4 = Yes  F3. Because of your problem, do you restrict your  travel for business or recreation?  4 = Yes  P4. Does walking down the aisle of a supermarket increase your problems?  0 = No  F5. Because of your problem, do you have difficulty getting into or out of bed?  4 = Yes  F6. Does your problem significantly restrict your participation in social activities, such as going out to dinner, going to the movies, dancing, or going to parties? 4 = Yes  F7. Because of your problem, do you have difficulty reading?  4 = Yes  P8. Does performing more ambitious activities such as sports, dancing, household chores (sweeping or putting dishes away) increase your problems?  2 = Sometimes  E9. Because of your problem, are you afraid to leave your home without having without having someone accompany you?  2 = Sometimes  E10. Because of your problem have you been embarrassed in front of others?  0 = No  P11. Do quick  movements of your head increase your problem?  4 = Yes  F12. Because of your problem, do you avoid heights?  2 = Sometimes  P13. Does turning over in bed increase your problem?  4 = Yes  F14. Because of your problem, is it difficult for you to do strenuous homework or yard work? 2 = Sometimes  E15. Because of your problem, are you afraid people may think you are intoxicated? 0 = No  F16. Because of your problem, is it difficult for you to go for a walk by yourself?  4 = Yes  P17. Does walking down a sidewalk increase your problem?  2 = Sometimes  E18.Because of your problem, is it difficult for you to concentrate 0 = No  F19. Because of your problem, is it difficult for you to walk around your house in the dark? 4 = Yes  E20. Because of your problem, are you afraid to stay home alone?  2 = Sometimes  E21. Because of your problem, do you feel handicapped? 2 = Sometimes  E22. Has the problem placed stress on your relationships with members of your family or friends? 2 = Sometimes  E23. Because of your problem, are you depressed?  0 = No  F24. Does your problem interfere with your job or household responsibilities?  4 = Yes  P25. Does bending over increase your problem?  4 = Yes  TOTAL 64    DHI Scoring Instructions  The patient is asked to answer each question as it pertains to dizziness or unsteadiness problems, specifically  considering their condition during the last month. Questions are designed to incorporate functional (F), physical  (P), and emotional (E) impacts on disability.   Scores greater than 10 points should be referred to balance specialists for further evaluation.   16-34 Points (mild handicap)  36-52 Points (moderate handicap)  54+ Points (severe handicap)  Minimally Detectable Change: 17 points ([address] [place], 1990)  [place], G. SHAUNNA. and [place], C. W. (1990). The development of the Dizziness Handicap Inventory. Archives of Otolaryngology - Head and Neck Surgery  116(4): [id number].         PATIENT EDUCATION: Education details: Need to assess with light suppressed IR goggles for definitive R/O Person educated: Patient  Education method: Explanation  Education comprehension: discussion   HOME EXERCISE PROGRAM: None   GOALS: Goals reviewed with patient?   SHORT TERM GOALS: Target date: 06/19/24  Pt to report no longer having dizziness episodes when performing be mobility.  Baseline: Goal status: INITIAL  2.  Pt to report no dizziness triggers when washing hair.  Baseline:  Goal status: INITIAL  3. Patient will reduce dizziness handicap inventory score to <50, for less dizziness with ADLs and increased safety with home and work tasks.    Baseline: 05/28/24: filled out retrospectively, 61   ASSESSMENT:  CLINICAL IMPRESSION:  Due to pt report of some residual dizziness when laying down at night & when looking up, completed repeated additional loaded R Dix-Hallpike & Epley/CRM. Pt asymptomatic throughout, no nystagmus noted. Completed additional orthostatics assessment d/t pt report of blurry vision when sitting up from Dix-Hallpike. Pt with mild symptoms upon coming to standing, but without significant drop in BP; noted 13 drop in systolic BP. Pt completing DHI retrospectively, as if completing at initial evlauation. Pt scoring 64, indicating severe handicap. The pt will benefit from further skilled PT to improve positional and movement evoked dizziness and balance in order to return pt to PLOF, decrease fall risk, and increase QOL.   OBJECTIVE IMPAIRMENTS: Decreased knowledge of condition, decreased use of DME, decreased mobility, difficulty walking, decreased strength, decreased ROM. ACTIVITY LIMITATIONS: Lifting, standing, walking, squatting, transfers, locomotion level PARTICIPATION LIMITATIONS: Cleaning, laundry, interpersonal relationships, driving, yardwork, community activity.  PERSONAL FACTORS: Age, behavior pattern, education,  past/current experiences, transportation, profession  are also affecting patient's functional outcome.  REHAB POTENTIAL: Good CLINICAL DECISION MAKING: Medium  EVALUATION COMPLEXITY: Moderate    PLAN:  PT FREQUENCY: 1-2x/week PT DURATION: 8 weeks PLANNED INTERVENTIONS: 97110-Therapeutic exercises, 97530- Therapeutic activity, 97112- Neuromuscular re-education, 97535- Self Care, [id number]- Manual therapy, [phone]- Gait training, [phone]- Electrical stimulation (manual), Patient/Family education, Balance training, Stair training, Spinal manipulation, Spinal mobilization, Vestibular training, Visual/preceptual remediation/compensation, Wheelchair mobility training, and Moist heat  PLAN FOR NEXT SESSION:  Repeated testing, CRM for R posterior canal Perform provacative movements during session to assess for nystagmus MMST if appropriate  Chiquita Silvan, SPT Physical Therapy Student - [facility], Student-PT 05/28/2024, 5:19 PM  5:19 PM, 05/28/24

## 2024-06-03 ENCOUNTER — Ambulatory Visit: Admitting: Physical Therapy

## 2024-06-03 NOTE — Therapy (Incomplete)
 OUTPATIENT PHYSICAL THERAPY TREATMENT  Patient Name: Charlene Coleman MRN: [id number] DOB:[birth year], 83 y.o., female Today's Date: 06/03/2024  PCP: Alla Amis, MD REFERRING PROVIDER: Alla Amis, MD   END OF SESSION: ***        Past Medical History:  Diagnosis Date   (HFpEF) heart failure with preserved ejection fraction (HCC)    a. 06/2021 Echo: EF 60-65%, no rwma, mild asymm LVH of basal-septal segment. Nl RV fxn. RVSP 23.37mmHg. Mild MR. AoV sclerosis w/o stenosis.   Aortic valve sclerosis    a. 06/2021 Echo: Ao sclerosis w/o stenosis.   Borderline diabetes    Breast cancer (HCC) 2015   Left side - radiation - lumpectomy   CAD (coronary artery disease)    a. 12/2021 Cath: LM nl, LAD 25p/m, D1 25, RI nl, LCX nl, RCA 20/30p, 64m->Med rx.   DCIS (ductal carcinoma in situ)    Dyspnea    Dysrhythmia    Empty sella syndrome    GERD (gastroesophageal reflux disease)    Gout    Hyperlipidemia    Hypertension    PAF (paroxysmal atrial fibrillation) (HCC)    a. Amio stopped 2/2 thyroid  dysfunction; b. 09/2021 s/p PVI.   Personal history of radiation therapy    Skin cancer 2011   Past Surgical History:  Procedure Laterality Date   ABDOMINAL HYSTERECTOMY     ATRIAL FIBRILLATION ABLATION N/A 09/21/2021   Procedure: ATRIAL FIBRILLATION ABLATION;  Surgeon: Cindie Ole DASEN, MD;  Location: [facility];  Service: Cardiovascular;  Laterality: N/A;   ATRIAL FIBRILLATION ABLATION N/A 03/21/2023   Procedure: ATRIAL FIBRILLATION ABLATION;  Surgeon: Cindie Ole DASEN, MD;  Location: [facility];  Service: Cardiovascular;  Laterality: N/A;   BREAST BIOPSY Left 2015   positive   BREAST LUMPECTOMY Left 09/23/2013   CARDIOVERSION N/A 12/28/2022   Procedure: CARDIOVERSION;  Surgeon: Darliss Rogue, MD;  Location: [facility];  Service: Cardiovascular;  Laterality: N/A;   CATARACT EXTRACTION Bilateral    CHOLECYSTECTOMY     ELECTROPHYSIOLOGIC STUDY     GASTRIC  FUNDOPLICATION     JOINT REPLACEMENT Right 09/22/2020   R hip replacement   LEFT HEART CATH AND CORONARY ANGIOGRAPHY N/A 12/30/2021   Procedure: LEFT HEART CATH AND CORONARY ANGIOGRAPHY;  Surgeon: Mady Bruckner, MD;  Location: [facility];  Service: Cardiovascular;  Laterality: N/A;   left lumpectomy     Patient Active Problem List   Diagnosis Date Noted   Diastolic heart failure (HCC) 05/17/2023   Dyslipidemia 05/17/2023   Gout 05/17/2023   Chronic lumbar radiculopathy 07/26/2022   A-fib (HCC) 01/24/2022   Chest pain    Hypercoagulable state due to paroxysmal atrial fibrillation (HCC) 10/19/2021   Acquired hypothyroidism 09/14/2020   CAD (coronary artery disease), native coronary artery 03/07/2017   Empty sella syndrome 03/07/2017   Benign essential hypertension 03/07/2017   Hallux valgus, acquired 03/07/2017   H/O: gout 03/07/2017   Mixed hyperlipidemia 03/07/2017   Non morbid obesity due to excess calories 03/07/2017   Other hammer toe (acquired) 03/07/2017   Synovitis and tenosynovitis 03/07/2017   Medicare annual wellness visit, initial 01/09/2017   Medicare annual wellness visit, subsequent 01/09/2017   Osteopenia of multiple sites 01/09/2017   Vaccine counseling 01/09/2017   Personal history of other malignant neoplasm of skin 11/11/2015   Ductal carcinoma in situ (DCIS) of left breast 02/01/2015   Dysphagia 07/16/2014   FH: colon polyps 07/16/2014   Cancer of breast (HCC) 03/14/2014  GERD (gastroesophageal reflux disease) 03/14/2014   H/O cardiac catheterization 03/14/2014    ONSET DATE: September 2025  REFERRING DIAG: Vertigo   THERAPY DIAG: *** No diagnosis found.  Rationale for Evaluation and Treatment: REHABILITATION  SUBJECTIVE:                                                                                                                                                                                             SUBJECTIVE STATEMENT:    *** Pt with only 1 minor instance of minor dizziness when she got into bed, one night last week, potentially Thurs night. Pt reporting that she laid down flat, felt a little bit of dizziness, trialed turning her head to L with no difference in symptoms. Pt reporting worsening of symptoms when turning her head to the R. No dizziness over the weekend.   Pt reporting that she hasn't had any dizziness getting up from bed. In the shower this morning, had no dizziness when looking up.    From initial eval:  Pt has been having short episodes of spinning dizziness mostly associated with getting into bed at night, rolling in bed during night, or getting out of bed in morning or over night for BR trips. Pt says this happened during a recent cardiology visit when she was positioning for US . Pt says this all started no long after a recent trip/fall and impact of Left cheek bone on a piece of furniture (no injury sustained.) Pt says episodes have progressively decreased in severity, but she still have some mild triggering with cervical extension when rinsing her hair in shower. Pt has taken no antiemetics or antidizzy meds. Pt denies any other presyncopal prodrome, visual changes.   Pt accompanied by: self  PERTINENT HISTORY:  Pt has been having short episodes of spinning dizziness mostly associated with getting into bed at night, rolling in bed during night, or getting out of bed in morning or over night for BR trips. Pt says this happened during a recent cardiology visit when she was positioning for US . Pt says this all started no long after a recent trip/fall and impact of Left cheek bone on a piece of furniture (no injury sustained.) Pt says episodes have progressively decreased in severity, but she still have some mild triggering with cervical extension when rinsing her hair in shower. Pt has taken no antiemetics or antidizzy meds. Pt denies any other presyncopal prodrome, visual changes. Pt AMB with [facility] but  denies any regular falls or close calls aside from recent event.  PAIN:  Are you having pain? No   PRECAUTIONS: None  WEIGHT BEARING RESTRICTIONS:  No  FALLS: Has patient fallen in last 6 months? Yes 1   PATIENT GOALS: Stop being dizzy   OBJECTIVE:  Note: Objective measures were completed at Evaluation unless otherwise noted.  DIAGNOSTIC FINDINGS:   Left dix hallpike negative, Right negative, Left roll test negative; right negative (no symptoms or aberrant eye movements with any of these)   Orthostatic VS:  185/72 53bpm supine  164/69 53bpm supine  161/81 55bpm seated (normal typical mild, brief dizziness, no vertiginous or akin to CC)  156/77 55bpm standing 166/88 58bpm standing x1 minutes    OCULOMOTOR EXAM: (05/21/24)  Ocular Alignment: normal  Ocular ROM: No Limitations  Spontaneous Nystagmus: absent  Gaze-Induced Nystagmus: absent  Smooth Pursuits: noted extra eye movements with R eye when coming back to midline from the L & with all diagonals  Saccades: hypermetric/overshoots when going to L, both purely horizontal & during diagonals; pt noting that she felt like her eyes were lingering at the L targets  Cover/Cross-Cover: WNL  VESTIBULAR - OCULAR REFLEX: (05/21/24)  Slow VOR: Normal  VOR Cancellation: Normal  Head-Impulse Test: positive bilaterally, symmetrical. Mild corrective catchup saccades.   Dynamic Visual Acuity: Not able to be assessed Pt asymptomatic throughout oculomotor exam & VOR testing.                                                                                                                  TREATMENT DATE: 06/03/2024 ***   3x repeated Loaded R Dix-Hallpike: no symptoms, no nystagmus.  1x R Epley/CRM performed due to pt report of resisdual dizziness symptoms following previous session.   When sitting up from Dix-Hallpike maneuvers, pt with some blurred vision, confirmed to be different symptom/feeling than dizziness that she originally came  in with.   Reassessed orthostatics:  Supine: BP: 170/67 (98) HR: 51 Standing immediately: BP: 157/75 HR: 54   Pt reporting mild symptoms resolving after ~40 sec ; pt confirming difference in symptoms compared to vertigo symptoms.   3 min standing: BP: 165/75 HR: 57   Educated pt on POC based on symptom report at next session.   Had pt fill out [facility] retrospectively, as if filling out at initial evaluation to establish LTG for self-reported outcome measure.  THE DIZZINESS HANDICAP INVENTORY (DHI)  P1. Does looking up increase your problem? 4 = Yes  E2. Because of your problem, do you feel frustrated? 4 = Yes  F3. Because of your problem, do you restrict your travel for business or recreation?  4 = Yes  P4. Does walking down the aisle of a supermarket increase your problems?  0 = No  F5. Because of your problem, do you have difficulty getting into or out of bed?  4 = Yes  F6. Does your problem significantly restrict your participation in social activities, such as going out to dinner, going to the movies, dancing, or going to parties? 4 = Yes  F7. Because of your problem, do you have difficulty reading?  4 = Yes  P8. Does performing  more ambitious activities such as sports, dancing, household chores (sweeping or putting dishes away) increase your problems?  2 = Sometimes  E9. Because of your problem, are you afraid to leave your home without having without having someone accompany you?  2 = Sometimes  E10. Because of your problem have you been embarrassed in front of others?  0 = No  P11. Do quick movements of your head increase your problem?  4 = Yes  F12. Because of your problem, do you avoid heights?  2 = Sometimes  P13. Does turning over in bed increase your problem?  4 = Yes  F14. Because of your problem, is it difficult for you to do strenuous homework or yard work? 2 = Sometimes  E15. Because of your problem, are you afraid people may think you are intoxicated? 0 = No  F16. Because of  your problem, is it difficult for you to go for a walk by yourself?  4 = Yes  P17. Does walking down a sidewalk increase your problem?  2 = Sometimes  E18.Because of your problem, is it difficult for you to concentrate 0 = No  F19. Because of your problem, is it difficult for you to walk around your house in the dark? 4 = Yes  E20. Because of your problem, are you afraid to stay home alone?  2 = Sometimes  E21. Because of your problem, do you feel handicapped? 2 = Sometimes  E22. Has the problem placed stress on your relationships with members of your family or friends? 2 = Sometimes  E23. Because of your problem, are you depressed?  0 = No  F24. Does your problem interfere with your job or household responsibilities?  4 = Yes  P25. Does bending over increase your problem?  4 = Yes  TOTAL 64    DHI Scoring Instructions  The patient is asked to answer each question as it pertains to dizziness or unsteadiness problems, specifically  considering their condition during the last month. Questions are designed to incorporate functional (F), physical  (P), and emotional (E) impacts on disability.   Scores greater than 10 points should be referred to balance specialists for further evaluation.   16-34 Points (mild handicap)  36-52 Points (moderate handicap)  54+ Points (severe handicap)  Minimally Detectable Change: 17 points ([address] [place], 1990)  [place], G. SHAUNNA. and [place], C. W. (1990). The development of the Dizziness Handicap Inventory. Archives of Otolaryngology - Head and Neck Surgery 116(4): [id number].         PATIENT EDUCATION: Education details: Need to assess with light suppressed IR goggles for definitive R/O Person educated: Patient  Education method: Explanation  Education comprehension: discussion   HOME EXERCISE PROGRAM: None   GOALS: Goals reviewed with patient?   SHORT TERM GOALS: Target date: 06/19/24  Pt to report no longer having dizziness episodes when  performing be mobility.  Baseline: Goal status: INITIAL  2.  Pt to report no dizziness triggers when washing hair.  Baseline:  Goal status: INITIAL  3. Patient will reduce dizziness handicap inventory score to <50, for less dizziness with ADLs and increased safety with home and work tasks.    Baseline: 05/28/24: filled out retrospectively, 74   ASSESSMENT:  CLINICAL IMPRESSION: *** Due to pt report of some residual dizziness when laying down at night & when looking up, completed repeated additional loaded R Dix-Hallpike & Epley/CRM. Pt asymptomatic throughout, no nystagmus noted. Completed additional orthostatics assessment d/t pt report of  blurry vision when sitting up from Dix-Hallpike. Pt with mild symptoms upon coming to standing, but without significant drop in BP; noted 13 drop in systolic BP. Pt completing DHI retrospectively, as if completing at initial evlauation. Pt scoring 64, indicating severe handicap. The pt will benefit from further skilled PT to improve positional and movement evoked dizziness and balance in order to return pt to PLOF, decrease fall risk, and increase QOL.   OBJECTIVE IMPAIRMENTS: Decreased knowledge of condition, decreased use of DME, decreased mobility, difficulty walking, decreased strength, decreased ROM. ACTIVITY LIMITATIONS: Lifting, standing, walking, squatting, transfers, locomotion level PARTICIPATION LIMITATIONS: Cleaning, laundry, interpersonal relationships, driving, yardwork, community activity.  PERSONAL FACTORS: Age, behavior pattern, education, past/current experiences, transportation, profession  are also affecting patient's functional outcome.  REHAB POTENTIAL: Good CLINICAL DECISION MAKING: Medium  EVALUATION COMPLEXITY: Moderate    PLAN:  PT FREQUENCY: 1-2x/week PT DURATION: 8 weeks PLANNED INTERVENTIONS: 97110-Therapeutic exercises, 97530- Therapeutic activity, 97112- Neuromuscular re-education, 97535- Self Care, [id number]- Manual  therapy, [phone]- Gait training, [phone]- Electrical stimulation (manual), Patient/Family education, Balance training, Stair training, Spinal manipulation, Spinal mobilization, Vestibular training, Visual/preceptual remediation/compensation, Wheelchair mobility training, and Moist heat  PLAN FOR NEXT SESSION: *** Repeated testing, CRM for R posterior canal Perform provacative movements during session to assess for nystagmus MMST if appropriate  Chiquita Silvan, SPT Physical Therapy Student - [facility] [place], Student-PT 06/03/2024, 7:52 AM  7:52 AM, 06/03/24

## 2024-06-06 ENCOUNTER — Ambulatory Visit: Attending: Family Medicine | Admitting: Physical Therapy

## 2024-06-06 DIAGNOSIS — R2681 Unsteadiness on feet: Secondary | ICD-10-CM | POA: Diagnosis not present

## 2024-06-06 DIAGNOSIS — R42 Dizziness and giddiness: Secondary | ICD-10-CM | POA: Insufficient documentation

## 2024-06-06 NOTE — Therapy (Signed)
 OUTPATIENT PHYSICAL THERAPY TREATMENT/DISCHARGE  Patient Name: Charlene Coleman MRN: [id number] DOB:01-Sep-1940, 83 y.o., female Today's Date: 06/06/2024  PCP: Alla Amis, MD REFERRING PROVIDER: Alla Amis, MD   END OF SESSION:   PT End of Session - 06/06/24 1158     Visit Number 6    Number of Visits 12    Date for Recertification  06/19/24    Authorization Type [employer]    Progress Note Due on Visit 10    PT Start Time 1150    PT Stop Time 1215    PT Time Calculation (min) 25 min    Activity Tolerance Patient tolerated treatment well    Behavior During Therapy [facility] for tasks assessed/performed               Past Medical History:  Diagnosis Date   (HFpEF) heart failure with preserved ejection fraction (HCC)    a. 06/2021 Echo: EF 60-65%, no rwma, mild asymm LVH of basal-septal segment. Nl RV fxn. RVSP 23.53mmHg. Mild MR. AoV sclerosis w/o stenosis.   Aortic valve sclerosis    a. 06/2021 Echo: Ao sclerosis w/o stenosis.   Borderline diabetes    Breast cancer (HCC) 2015   Left side - radiation - lumpectomy   CAD (coronary artery disease)    a. 12/2021 Cath: LM nl, LAD 25p/m, D1 25, RI nl, LCX nl, RCA 20/30p, 61m->Med rx.   DCIS (ductal carcinoma in situ)    Dyspnea    Dysrhythmia    Empty sella syndrome    GERD (gastroesophageal reflux disease)    Gout    Hyperlipidemia    Hypertension    PAF (paroxysmal atrial fibrillation) (HCC)    a. Amio stopped 2/2 thyroid  dysfunction; b. 09/2021 s/p PVI.   Personal history of radiation therapy    Skin cancer 2011   Past Surgical History:  Procedure Laterality Date   ABDOMINAL HYSTERECTOMY     ATRIAL FIBRILLATION ABLATION N/A 09/21/2021   Procedure: ATRIAL FIBRILLATION ABLATION;  Surgeon: Cindie Ole DASEN, MD;  Location: [facility];  Service: Cardiovascular;  Laterality: N/A;   ATRIAL FIBRILLATION ABLATION N/A 03/21/2023   Procedure: ATRIAL FIBRILLATION ABLATION;  Surgeon: Cindie Ole DASEN, MD;   Location: [facility];  Service: Cardiovascular;  Laterality: N/A;   BREAST BIOPSY Left 2015   positive   BREAST LUMPECTOMY Left 09/23/2013   CARDIOVERSION N/A 12/28/2022   Procedure: CARDIOVERSION;  Surgeon: Darliss Rogue, MD;  Location: [facility] ORS;  Service: Cardiovascular;  Laterality: N/A;   CATARACT EXTRACTION Bilateral    CHOLECYSTECTOMY     ELECTROPHYSIOLOGIC STUDY     GASTRIC FUNDOPLICATION     JOINT REPLACEMENT Right 09/22/2020   R hip replacement   LEFT HEART CATH AND CORONARY ANGIOGRAPHY N/A 12/30/2021   Procedure: LEFT HEART CATH AND CORONARY ANGIOGRAPHY;  Surgeon: Mady Bruckner, MD;  Location: [facility] INVASIVE CV LAB;  Service: Cardiovascular;  Laterality: N/A;   left lumpectomy     Patient Active Problem List   Diagnosis Date Noted   Diastolic heart failure (HCC) 05/17/2023   Dyslipidemia 05/17/2023   Gout 05/17/2023   Chronic lumbar radiculopathy 07/26/2022   A-fib (HCC) 01/24/2022   Chest pain    Hypercoagulable state due to paroxysmal atrial fibrillation (HCC) 10/19/2021   Acquired hypothyroidism 09/14/2020   CAD (coronary artery disease), native coronary artery 03/07/2017   Empty sella syndrome 03/07/2017   Benign essential hypertension 03/07/2017   Hallux valgus, acquired 03/07/2017   H/O: gout 03/07/2017   Mixed  hyperlipidemia 03/07/2017   Non morbid obesity due to excess calories 03/07/2017   Other hammer toe (acquired) 03/07/2017   Synovitis and tenosynovitis 03/07/2017   Medicare annual wellness visit, initial 01/09/2017   Medicare annual wellness visit, subsequent 01/09/2017   Osteopenia of multiple sites 01/09/2017   Vaccine counseling 01/09/2017   Personal history of other malignant neoplasm of skin 11/11/2015   Ductal carcinoma in situ (DCIS) of left breast 02/01/2015   Dysphagia 07/16/2014   FH: colon polyps 07/16/2014   Cancer of breast (HCC) 03/14/2014   GERD (gastroesophageal reflux disease) 03/14/2014   H/O cardiac catheterization  03/14/2014    ONSET DATE: September 2025  REFERRING DIAG: Vertigo   THERAPY DIAG:  Unsteadiness on feet  Dizziness and giddiness  Rationale for Evaluation and Treatment: REHABILITATION  SUBJECTIVE:                                                                                                                                                                                             SUBJECTIVE STATEMENT:    Pt states that she has not had any dizziness since last session. No dizziness when getting in/out of bed, in the shower/looking up. Pt agreeable to discharge from PT this date.   Pt stating that she has follow up with her PCP next week, appointment with cardiology tomorrow.    From initial eval:  Pt has been having short episodes of spinning dizziness mostly associated with getting into bed at night, rolling in bed during night, or getting out of bed in morning or over night for BR trips. Pt says this happened during a recent cardiology visit when she was positioning for US . Pt says this all started no long after a recent trip/fall and impact of Left cheek bone on a piece of furniture (no injury sustained.) Pt says episodes have progressively decreased in severity, but she still have some mild triggering with cervical extension when rinsing her hair in shower. Pt has taken no antiemetics or antidizzy meds. Pt denies any other presyncopal prodrome, visual changes.   Pt accompanied by: self  PERTINENT HISTORY:  Pt has been having short episodes of spinning dizziness mostly associated with getting into bed at night, rolling in bed during night, or getting out of bed in morning or over night for BR trips. Pt says this happened during a recent cardiology visit when she was positioning for US . Pt says this all started no long after a recent trip/fall and impact of Left cheek bone on a piece of furniture (no injury sustained.) Pt says episodes have progressively decreased in severity, but she  still have  some mild triggering with cervical extension when rinsing her hair in shower. Pt has taken no antiemetics or antidizzy meds. Pt denies any other presyncopal prodrome, visual changes. Pt AMB with SPC but denies any regular falls or close calls aside from recent event.  PAIN:  Are you having pain? No   PRECAUTIONS: None  WEIGHT BEARING RESTRICTIONS: No  FALLS: Has patient fallen in last 6 months? Yes 1   PATIENT GOALS: Stop being dizzy   OBJECTIVE:  Note: Objective measures were completed at Evaluation unless otherwise noted.  DIAGNOSTIC FINDINGS:   Left dix hallpike negative, Right negative, Left roll test negative; right negative (no symptoms or aberrant eye movements with any of these)   Orthostatic VS:  185/72 53bpm supine  164/69 53bpm supine  161/81 55bpm seated (normal typical mild, brief dizziness, no vertiginous or akin to CC)  156/77 55bpm standing 166/88 58bpm standing x1 minutes    OCULOMOTOR EXAM: (05/21/24)  Ocular Alignment: normal  Ocular ROM: No Limitations  Spontaneous Nystagmus: absent  Gaze-Induced Nystagmus: absent  Smooth Pursuits: noted extra eye movements with R eye when coming back to midline from the L & with all diagonals  Saccades: hypermetric/overshoots when going to L, both purely horizontal & during diagonals; pt noting that she felt like her eyes were lingering at the L targets  Cover/Cross-Cover: WNL  VESTIBULAR - OCULAR REFLEX: (05/21/24)  Slow VOR: Normal  VOR Cancellation: Normal  Head-Impulse Test: positive bilaterally, symmetrical. Mild corrective catchup saccades.   Dynamic Visual Acuity: Not able to be assessed Pt asymptomatic throughout oculomotor exam & VOR testing.                                                                                                                  TREATMENT DATE: 06/06/2024  Pt completing DHI as noted below.  THE DIZZINESS HANDICAP INVENTORY (DHI)  P1. Does looking up increase your  problem? 0 = No  E2. Because of your problem, do you feel frustrated? 0 = No  F3. Because of your problem, do you restrict your travel for business or recreation?  0 = No  P4. Does walking down the aisle of a supermarket increase your problems?  0 = No  F5. Because of your problem, do you have difficulty getting into or out of bed?  0 = No  F6. Does your problem significantly restrict your participation in social activities, such as going out to dinner, going to the movies, dancing, or going to parties? 0 = No  F7. Because of your problem, do you have difficulty reading?  0 = No  P8. Does performing more ambitious activities such as sports, dancing, household chores (sweeping or putting dishes away) increase your problems?  0 = No  E9. Because of your problem, are you afraid to leave your home without having without having someone accompany you?  0 = No  E10. Because of your problem have you been embarrassed in front of others?  0 = No  P11. Do quick movements  of your head increase your problem?  0 = No  F12. Because of your problem, do you avoid heights?  0 = No  P13. Does turning over in bed increase your problem?  0 = No  F14. Because of your problem, is it difficult for you to do strenuous homework or yard work? 0 = No  E15. Because of your problem, are you afraid people may think you are intoxicated? 0 = No  F16. Because of your problem, is it difficult for you to go for a walk by yourself?  2 = Sometimes  P17. Does walking down a sidewalk increase your problem?  0 = No  E18.Because of your problem, is it difficult for you to concentrate 0 = No  F19. Because of your problem, is it difficult for you to walk around your house in the dark? 2 = Sometimes  E20. Because of your problem, are you afraid to stay home alone?  2 = Sometimes  E21. Because of your problem, do you feel handicapped? 0 = No  E22. Has the problem placed stress on your relationships with members of your family or friends? 0  = No  E23. Because of your problem, are you depressed?  0 = No  F24. Does your problem interfere with your job or household responsibilities?  0 = No  P25. Does bending over increase your problem?  0 = No  TOTAL 6    DHI Scoring Instructions  The patient is asked to answer each question as it pertains to dizziness or unsteadiness problems, specifically  considering their condition during the last month. Questions are designed to incorporate functional (F), physical  (P), and emotional (E) impacts on disability.   Scores greater than 10 points should be referred to balance specialists for further evaluation.   16-34 Points (mild handicap)  36-52 Points (moderate handicap)  54+ Points (severe handicap)  Minimally Detectable Change: 17 points ([address] [place], 1990)  [place], G. SHAUNNA. and [place], C. W. (1990). The development of the Dizziness Handicap Inventory. Archives of Otolaryngology - Head and Neck Surgery 116(4): [id number].    Continued to provide education on BPPV. Educated pt on increased risk of BPPV due to history of vertigo. Recommendation to return to vestibular PT if additional vertigo episodes instead of trying to treat at home.        PATIENT EDUCATION: Education details: Need to assess with light suppressed IR goggles for definitive R/O Person educated: Patient  Education method: Explanation  Education comprehension: discussion   HOME EXERCISE PROGRAM: None   GOALS: Goals reviewed with patient?   SHORT TERM GOALS: Target date: 06/19/24  Pt to report no longer having dizziness episodes when performing be mobility.  Baseline:  06/06/24: pt reporting no dizziness since last session Goal status: MET  2.  Pt to report no dizziness triggers when washing hair.  Baseline:  06/06/24: pt reporting no dizziness since last session, able to demonstrate without symptom provocation Goal status: MET  3. Patient will reduce dizziness handicap inventory score to <50,  for less dizziness with ADLs and increased safety with home and work tasks.    Baseline: 05/28/24: filled out retrospectively, 64    06/06/24: 6     Goal status: MET    ASSESSMENT:  CLINICAL IMPRESSION:  Pt reporting that her dizziness symptoms have resolved & she has not experienced any dizziness since previous session. Pt with significant improvement in DHI score, indicative of increased safety with ADLs. Pt provided education  of increased risk of BPPV due to history of vertigo, provided education to return to vestibular PT if vertigo reoccurs rather than treating at home. Pt agreeable to discharge from therapy this date.    OBJECTIVE IMPAIRMENTS: Decreased knowledge of condition, decreased use of DME, decreased mobility, difficulty walking, decreased strength, decreased ROM. ACTIVITY LIMITATIONS: Lifting, standing, walking, squatting, transfers, locomotion level PARTICIPATION LIMITATIONS: Cleaning, laundry, interpersonal relationships, driving, yardwork, community activity.  PERSONAL FACTORS: Age, behavior pattern, education, past/current experiences, transportation, profession  are also affecting patient's functional outcome.  REHAB POTENTIAL: Good CLINICAL DECISION MAKING: Medium  EVALUATION COMPLEXITY: Moderate    PLAN:  PT FREQUENCY: 1-2x/week PT DURATION: 8 weeks PLANNED INTERVENTIONS: 97110-Therapeutic exercises, 97530- Therapeutic activity, 97112- Neuromuscular re-education, 97535- Self Care, [id number]- Manual therapy, [phone]- Gait training, [phone]- Electrical stimulation (manual), Patient/Family education, Balance training, Stair training, Spinal manipulation, Spinal mobilization, Vestibular training, Visual/preceptual remediation/compensation, Wheelchair mobility training, and Moist heat  PLAN FOR NEXT SESSION:    Chiquita Silvan, SPT Physical Therapy Student - [facility], [place] 06/06/2024, 12:25 PM  12:25 PM,  06/06/24

## 2024-06-07 ENCOUNTER — Ambulatory Visit: Attending: Cardiology | Admitting: Cardiology

## 2024-06-07 ENCOUNTER — Encounter: Payer: Self-pay | Admitting: Cardiology

## 2024-06-07 VITALS — BP 180/78 | HR 52 | Ht 60.0 in | Wt 143.6 lb

## 2024-06-07 DIAGNOSIS — I5189 Other ill-defined heart diseases: Secondary | ICD-10-CM | POA: Diagnosis not present

## 2024-06-07 DIAGNOSIS — I4819 Other persistent atrial fibrillation: Secondary | ICD-10-CM | POA: Diagnosis not present

## 2024-06-07 DIAGNOSIS — I10 Essential (primary) hypertension: Secondary | ICD-10-CM | POA: Diagnosis not present

## 2024-06-07 DIAGNOSIS — I2510 Atherosclerotic heart disease of native coronary artery without angina pectoris: Secondary | ICD-10-CM | POA: Diagnosis not present

## 2024-06-07 MED ORDER — AMLODIPINE BESYLATE 2.5 MG PO TABS: 1.2500 mg | ORAL_TABLET | Freq: Every day | ORAL | Status: DC

## 2024-06-07 MED ORDER — AMLODIPINE BESYLATE 2.5 MG PO TABS: 1.2500 mg | ORAL_TABLET | Freq: Every day | ORAL | 3 refills | Status: AC

## 2024-06-07 NOTE — Patient Instructions (Signed)
 Medication Instructions:  - START 1.25 mg of amlodipine  daily which is half a tablet  *If you need a refill on your cardiac medications before your next appointment, please call your pharmacy*  Lab Work: No labs ordered today  If you have labs (blood work) drawn today and your tests are completely normal, you will receive your results only by: MyChart Message (if you have MyChart) OR A paper copy in the mail If you have any lab test that is abnormal or we need to change your treatment, we will call you to review the results.  Testing/Procedures: No test ordered today   Follow-Up: At [facility], you and your health needs are our priority.  As part of our continuing mission to provide you with exceptional heart care, our providers are all part of one team.  This team includes your primary Cardiologist (physician) and Advanced Practice Providers or APPs (Physician Assistants and Nurse Practitioners) who all work together to provide you with the care you need, when you need it.  Your next appointment:   6 week(s)  Provider:   You may see [name], MD or one of the following Advanced Practice Providers on your designated Care Team:   Lonni Meager, NP Lesley Maffucci, PA-C Bernardino Bring, PA-C Cadence Fairmount, PA-C Tylene Lunch, NP Barnie Hila, NP    We recommend signing up for the patient portal called MyChart.  Sign up information is provided on this After Visit Summary.  MyChart is used to connect with patients for Virtual Visits (Telemedicine).  Patients are able to view lab/test results, encounter notes, upcoming appointments, etc.  Non-urgent messages can be sent to your provider as well.   To learn more about what you can do with MyChart, go to [email or web address].

## 2024-06-07 NOTE — Progress Notes (Signed)
 Cardiology Office Note:    Date:  06/07/2024   ID:  Charlene Coleman, DOB [birth year], MRN [id number]  PCP:  Alla Amis, MD  [facility] Cardiologist:  [name], MD  [facility] Electrophysiologist:  [name], MD   Referring MD: Alla Amis, MD   Chief Complaint  Patient presents with   Follow-up     follow up /  pt has been doing well with no complaints of chest pain, chest pressure or SOB,  The patient states she has had an A - fib attack x 2 since her hospital visit. medication reviewed verbally with patient.    History of Present Illness:    Charlene Coleman is a 83 y.o. female with a hx of paroxysmal atrial fibrillation s/p ablation x 2 (09/2018, 03/2023) currently on Tikosyn ,  hypertension, hyperlipidemia, non obstructive CAD (LHC 6/23: 25% pLAD, 50% dRCA), HFpEF who presents for follow-up.   Doing okay, BP has been elevated at home with systolics in the 160s.  Compliant with irbesartan  300 mg daily, previously prescribed amlodipine  2.5 mg daily but this was stopped due to drop in BP.  Previously had dizziness attributed to vertigo, resolved after Epley maneuver.  Denies palpitations.  Denies any bleeding issues with Eliquis .  Prior notes LHC 6/23: 25% pLAD, 50% dRCA) Cardiac monitor in the past revealed atrial fibrillation with 12.68% burden.  LHC 2009 showed moderate stenosis 50% in D1, RCA 09/2007 .   Echo 03/2020 normal systolic function, grade 2 diastolic dysfunction, moderate pulmonary hypertension.  Past Medical History:  Diagnosis Date   (HFpEF) heart failure with preserved ejection fraction (HCC)    a. 06/2021 Echo: EF 60-65%, no rwma, mild asymm LVH of basal-septal segment. Nl RV fxn. RVSP 23.31mmHg. Mild MR. AoV sclerosis w/o stenosis.   Aortic valve sclerosis    a. 06/2021 Echo: Ao sclerosis w/o stenosis.   Borderline diabetes    Breast cancer (HCC) 2015   Left side - radiation - lumpectomy   CAD (coronary artery disease)    a.  12/2021 Cath: LM nl, LAD 25p/m, D1 25, RI nl, LCX nl, RCA 20/30p, 41m->Med rx.   DCIS (ductal carcinoma in situ)    Dyspnea    Dysrhythmia    Empty sella syndrome    GERD (gastroesophageal reflux disease)    Gout    Hyperlipidemia    Hypertension    PAF (paroxysmal atrial fibrillation) (HCC)    a. Amio stopped 2/2 thyroid  dysfunction; b. 09/2021 s/p PVI.   Personal history of radiation therapy    Skin cancer 2011    Past Surgical History:  Procedure Laterality Date   ABDOMINAL HYSTERECTOMY     ATRIAL FIBRILLATION ABLATION N/A 09/21/2021   Procedure: ATRIAL FIBRILLATION ABLATION;  Surgeon: Holts Ole ONEIDA, MD;  Location: [facility] INV[facility];  Service: Cardiovascular;  Laterality: N/A;   ATRIAL FIBRILLATION ABLATION N/A 03/21/2023   Procedure: ATRIAL FIBRILLATION ABLATION;  Surgeon: Holts Ole ONEIDA, MD;  Location: [facility] INVASIVE CV LAB;  Service: Cardiovascular;  Laterality: N/A;   BREAST BIOPSY Left 2015   positive   BREAST LUMPECTOMY Left 09/23/2013   CARDIOVERSION N/A 12/28/2022   Procedure: CARDIOVERSION;  Surgeon: Cave Redell, MD;  Location: [facility] ORS;  Service: Cardiovascular;  Laterality: N/A;   CATARACT EXTRACTION Bilateral    CHOLECYSTECTOMY     ELECTROPHYSIOLOGIC STUDY     GASTRIC FUNDOPLICATION     JOINT REPLACEMENT Right 09/22/2020   R hip replacement   LEFT HEART CATH AND CORONARY ANGIOGRAPHY  N/A 12/30/2021   Procedure: LEFT HEART CATH AND CORONARY ANGIOGRAPHY;  Surgeon: Mady Bruckner, MD;  Location: [facility] INVASIVE CV LAB;  Service: Cardiovascular;  Laterality: N/A;   left lumpectomy      Current Medications: Current Meds  Medication Sig   allopurinol  (ZYLOPRIM ) 300 MG tablet Take 300 mg by mouth daily.    amLODipine  (NORVASC ) 2.5 MG tablet Take 0.5 tablets (1.25 mg total) by mouth daily.   apixaban  (ELIQUIS ) 5 MG TABS tablet TAKE ONE TABLET BY MOUTH TWICE DAILY   atorvastatin  (LIPITOR) 20 MG tablet Take 20 mg by mouth every evening.    carboxymethylcellulose (REFRESH PLUS) 0.5 % SOLN Place 1 drop into both eyes daily as needed (dry eyes).   dofetilide  (TIKOSYN ) 250 MCG capsule Take 1 capsule (250 mcg total) by mouth 2 (two) times daily.   famotidine  (PEPCID ) 20 MG tablet Take 20 mg by mouth at bedtime.   furosemide  (LASIX ) 20 MG tablet Take 1 tablet (20 mg total) by mouth daily. For swelling.   gabapentin (NEURONTIN) 300 MG capsule Take 300 mg by mouth 2 (two) times daily at 10 AM and 5 PM.   irbesartan  (AVAPRO ) 300 MG tablet Take 1 tablet (300 mg total) by mouth daily.   oxybutynin  (DITROPAN -XL) 5 MG 24 hr tablet Take 5 mg by mouth in the morning.   potassium chloride  SA (KLOR-CON  M) 20 MEQ tablet Take 1 tablet (20 mEq total) by mouth daily.   Probiotic Product (ADVANCED PROBIOTIC PO) Take 1 tablet by mouth every morning.     Allergies:   Enalapril, Amiodarone , and Tylenol  [acetaminophen ]   Social History   Socioeconomic History   Marital status: Married    Spouse name: Zachary   Number of children: 1   Years of education: Not on file   Highest education level: Not on file  Occupational History   Not on file  Tobacco Use   Smoking status: Never   Smokeless tobacco: Never   Tobacco comments:    Never smoke 10/19/21  Vaping Use   Vaping status: Never Used  Substance and Sexual Activity   Alcohol use: No   Drug use: No   Sexual activity: Not on file  Other Topics Concern   Not on file  Social History Narrative   Lives at home with spouse   Social Drivers of Corporate Investment Banker Strain: Low Risk  (02/05/2020)   Received from [facility]   Overall Financial Resource Strain (CARDIA)    Difficulty of Paying Living Expenses: Not hard at all  Food Insecurity: No Food Insecurity (02/05/2020)   Received from [facility]   Hunger Vital Sign    Within the past 12 months, you worried that your food would run out before you got the money to buy more.: Never true    Within the  past 12 months, the food you bought just didn't last and you didn't have money to get more.: Never true  Transportation Needs: No Transportation Needs (02/05/2020)   Received from [facility] - Transportation    In the past 12 months, has lack of transportation kept you from medical appointments or from getting medications?: No    Lack of Transportation (Non-Medical): No  Physical Activity: Inactive (02/05/2020)   Received from [facility]   Exercise Vital Sign    On average, how many days per week do you engage in moderate to strenuous exercise (like a brisk  walk)?: 0 days    On average, how many minutes do you engage in exercise at this level?: 0 min  Stress: No Stress Concern Present (02/05/2020)   Received from [facility] - Occupational Stress Questionnaire    Feeling of Stress : Only a little  Social Connections: Unknown (02/05/2020)   Received from [facility]   Social Connection and Isolation Panel    In a typical week, how many times do you talk on the phone with family, friends, or neighbors?: Three times a week    How often do you get together with friends or relatives?: Once a week    Attends Religious Services: Not on file    Do you belong to any clubs or organizations such as church groups, unions, fraternal or athletic groups, or school groups?: No    How often do you attend meetings of the clubs or organizations you belong to?: Never    Are you married, widowed, divorced, separated, never married, or living with a partner?: Married     Family History: The patient's family history includes Breast cancer in her cousin; Breast cancer (age of onset: 34) in her maternal aunt.  ROS:   Please see the history of present illness.     All other systems reviewed and are negative.  EKGs/Labs/Other Studies Reviewed:    The following studies were reviewed  today:   EKG Interpretation Date/Time:  Friday June 07 2024 09:29:52 EST Ventricular Rate:  52 PR Interval:  172 QRS Duration:  78 QT Interval:  480 QTC Calculation: 446 R Axis:   -16  Text Interpretation: Sinus bradycardia Nonspecific T wave abnormality Confirmed by Darliss Rogue ([id number]) on 06/07/2024 9:32:24 AM    Recent Labs: 04/05/2024: ALT 10; B Natriuretic Peptide 100.9; BUN 14; Creatinine, Ser 0.91; Hemoglobin 12.2; Magnesium  1.8; Platelets 224; Potassium 4.0; Sodium 138; TSH 2.620  Recent Lipid Panel    Component Value Date/Time   CHOL 170 06/15/2022 1157   TRIG 83 06/15/2022 1157   HDL 67 06/15/2022 1157   CHOLHDL 2.5 06/15/2022 1157   VLDL 17 06/15/2022 1157   LDLCALC 86 06/15/2022 1157    Physical Exam:    VS:  BP (!) 180/78 (BP Location: Left Arm, Patient Position: Sitting, Cuff Size: Normal)   Pulse (!) 52   Ht 5' (1.524 m)   Wt 143 lb 9.6 oz (65.1 kg)   SpO2 97%   BMI 28.04 kg/m     Wt Readings from Last 3 Encounters:  06/07/24 143 lb 9.6 oz (65.1 kg)  04/26/24 142 lb (64.4 kg)  04/17/24 150 lb 3.2 oz (68.1 kg)     GEN:  Well nourished, well developed in no acute distress HEENT: Normal NECK: No JVD; No carotid bruits CARDIAC: Bradycardic, regular, no murmurs, rubs, gallops RESPIRATORY:  Clear to auscultation without rales, wheezing or rhonchi  ABDOMEN: Soft, non-tender, non-distended MUSCULOSKELETAL:  No edema; No deformity  SKIN: Warm and dry NEUROLOGIC:  Alert and oriented x 3 PSYCHIATRIC:  Normal affect   ASSESSMENT:    1. Coronary artery disease involving native coronary artery of native heart without angina pectoris   2. Diastolic dysfunction   3. Persistent atrial fibrillation (HCC)   4. Primary hypertension     PLAN:    In order of problems listed above:  CAD, moderate stenosis in the first diagonal and RCA. denies chest pain.  Continue Eliquis , Lipitor.  Echo 03/2020 EF 55  to 60%.   Grade 2 diastolic dysfunction.  Appears  euvolemic, no edema, continue Lasix  20 mg daily. paroxysmal atrial fibrillation, s/p RFA 09/2021, 03/2023.  On Tikosyn  250 mg twice daily, Eliquis  5 mg twice daily.  Toprol -XL previously stopped due to bradycardia.   hypertension, blood pressure elevated today, start Norvasc  1.25 mg daily, continue irbesartan  300 mg daily.  Check BP at home and keep a log.  History of hypertension with Norvasc  2.5 mg daily.  Follow-up in 6 weeks for BP check...    Medication Adjustments/Labs and Tests Ordered: Current medicines are reviewed at length with the patient today.  Concerns regarding medicines are outlined above.  Orders Placed This Encounter  Procedures   EKG 12-Lead     Meds ordered this encounter  Medications   amLODipine  (NORVASC ) 2.5 MG tablet    Sig: Take 0.5 tablets (1.25 mg total) by mouth daily.      Patient Instructions  Medication Instructions:  - START 1.25 mg of amlodipine  daily which is half a tablet  *If you need a refill on your cardiac medications before your next appointment, please call your pharmacy*  Lab Work: No labs ordered today  If you have labs (blood work) drawn today and your tests are completely normal, you will receive your results only by: MyChart Message (if you have MyChart) OR A paper copy in the mail If you have any lab test that is abnormal or we need to change your treatment, we will call you to review the results.  Testing/Procedures: No test ordered today   Follow-Up: At [facility], you and your health needs are our priority.  As part of our continuing mission to provide you with exceptional heart care, our providers are all part of one team.  This team includes your primary Cardiologist (physician) and Advanced Practice Providers or APPs (Physician Assistants and Nurse Practitioners) who all work together to provide you with the care you need, when you need it.  Your next appointment:   6 week(s)  Provider:   You may see [name], MD or one of the following Advanced Practice Providers on your designated Care Team:   Lonni Meager, NP Lesley Maffucci, PA-C Bernardino Bring, PA-C Cadence Bellevue, PA-C Tylene Lunch, NP Barnie Hila, NP    We recommend signing up for the patient portal called MyChart.  Sign up information is provided on this After Visit Summary.  MyChart is used to connect with patients for Virtual Visits (Telemedicine).  Patients are able to view lab/test results, encounter notes, upcoming appointments, etc.  Non-urgent messages can be sent to your provider as well.   To learn more about what you can do with MyChart, go to [email or web address].              Signed, [name], MD  06/07/2024 10:24 AM    [facility]

## 2024-06-17 ENCOUNTER — Ambulatory Visit

## 2024-06-17 DIAGNOSIS — M170 Bilateral primary osteoarthritis of knee: Secondary | ICD-10-CM | POA: Diagnosis not present

## 2024-06-24 ENCOUNTER — Ambulatory Visit

## 2024-06-24 DIAGNOSIS — K080 Exfoliation of teeth due to systemic causes: Secondary | ICD-10-CM | POA: Diagnosis not present

## 2024-06-25 DIAGNOSIS — M1712 Unilateral primary osteoarthritis, left knee: Secondary | ICD-10-CM | POA: Diagnosis not present

## 2024-07-01 ENCOUNTER — Ambulatory Visit

## 2024-07-08 ENCOUNTER — Ambulatory Visit

## 2024-07-15 ENCOUNTER — Ambulatory Visit

## 2024-07-15 NOTE — Progress Notes (Unsigned)
°  Electrophysiology Office Follow up Visit Note:    Date:  07/17/2024   ID:  Charlene Coleman, DOB 26-Jul-1941, MRN [id number]  PCP:  Alla Amis, MD  [facility] Cardiologist:  [name], MD  [facility] Electrophysiologist:  [name], MD    Interval History:     Charlene Coleman is a 83 y.o. female who presents for a follow up visit.   I last saw the patient April 26, 2024.  The patient has an extensive history of atrial fibrillation with 2 prior catheter ablations and use of dofetilide .  She takes Eliquis  for stroke prophylaxis.  She is doing well today.  No atrial fibrillation episodes that she is aware of.  She uses an Apple watch to monitor her heart rhythm.       Past medical, surgical, social and family history were reviewed.  ROS:   Please see the history of present illness.    All other systems reviewed and are negative.  EKGs/Labs/Other Studies Reviewed:    The following studies were reviewed today:     EKG Interpretation Date/Time:  Wednesday July 17 2024 09:50:00 EST Ventricular Rate:  60 PR Interval:  178 QRS Duration:  86 QT Interval:  416 QTC Calculation: 416 R Axis:   -13  Text Interpretation: Normal sinus rhythm Normal ECG Confirmed by Holts Ole [phone]) on 07/17/2024 9:54:16 AM    Physical Exam:    VS:  BP (!) 140/62   Pulse 64   Ht 5' (1.524 m)   Wt 141 lb (64 kg)   SpO2 96%   BMI 27.54 kg/m     Wt Readings from Last 3 Encounters:  07/17/24 141 lb (64 kg)  06/07/24 143 lb 9.6 oz (65.1 kg)  04/26/24 142 lb (64.4 kg)     GEN: no distress CARD: RRR, No MRG RESP: No IWOB. CTAB.      ASSESSMENT:    1. Persistent atrial fibrillation (HCC)   2. Encounter for long-term (current) use of high-risk medication   3. Primary hypertension    PLAN:    In order of problems listed above:  #Persistent atrial fibrillation #High risk medication monitoring-dofetilide  Maintaining sinus rhythm on  dofetilide  QTc acceptable for ongoing dofetilide  use Continue Eliquis  Update BMP and Mag today  #Hypertension At goal today.  Recommend checking blood pressures 1-2 times per week at home and recording the values.  Recommend bringing these recordings to the primary care physician.  I discussed my upcoming departure from [facility] during today's clinic appointment.  The patient will continue to follow-up with one of my EP partners moving forward.  Follow-up 4 months with EP APP   Signed, Ole Holts, MD, [facility], [facility] 07/17/2024 10:02 AM    Electrophysiology [facility]

## 2024-07-17 ENCOUNTER — Other Ambulatory Visit: Payer: Self-pay

## 2024-07-17 ENCOUNTER — Ambulatory Visit: Attending: Cardiology | Admitting: Cardiology

## 2024-07-17 VITALS — BP 140/62 | HR 64 | Ht 60.0 in | Wt 141.0 lb

## 2024-07-17 DIAGNOSIS — Z79899 Other long term (current) drug therapy: Secondary | ICD-10-CM

## 2024-07-17 DIAGNOSIS — I4819 Other persistent atrial fibrillation: Secondary | ICD-10-CM | POA: Diagnosis not present

## 2024-07-17 DIAGNOSIS — I10 Essential (primary) hypertension: Secondary | ICD-10-CM

## 2024-07-17 NOTE — Patient Instructions (Signed)
 Medication Instructions:  Your physician recommends that you continue on your current medications as directed. Please refer to the Current Medication list given to you today.  *If you need a refill on your cardiac medications before your next appointment, please call your pharmacy*  Lab Work: TODAY:  BMET and Mag  Follow-Up: At [facility], you and your health needs are our priority.  As part of our continuing mission to provide you with exceptional heart care, our providers are all part of one team.  This team includes your primary Cardiologist (physician) and Advanced Practice Providers or APPs (Physician Assistants and Nurse Practitioners) who all work together to provide you with the care you need, when you need it.  Your next appointment:   4 months  Provider:   Suzann Riddle, NP

## 2024-07-19 LAB — BASIC METABOLIC PANEL WITH GFR
BUN/Creatinine Ratio: 11 — ABNORMAL LOW (ref 12–28)
BUN: 11 mg/dL (ref 8–27)
CO2: 21 mmol/L (ref 20–29)
Calcium: 9.5 mg/dL (ref 8.7–10.3)
Chloride: 102 mmol/L (ref 96–106)
Creatinine, Ser: 0.96 mg/dL (ref 0.57–1.00)
Glucose: 94 mg/dL (ref 70–99)
Potassium: 4.6 mmol/L (ref 3.5–5.2)
Sodium: 139 mmol/L (ref 134–144)
eGFR: 59 mL/min/1.73 — ABNORMAL LOW

## 2024-07-19 LAB — MAGNESIUM: Magnesium: 1.9 mg/dL (ref 1.6–2.3)

## 2024-07-22 ENCOUNTER — Ambulatory Visit

## 2024-07-23 ENCOUNTER — Other Ambulatory Visit: Payer: Self-pay | Admitting: Cardiology

## 2024-07-23 DIAGNOSIS — I480 Paroxysmal atrial fibrillation: Secondary | ICD-10-CM

## 2024-07-23 MED ORDER — APIXABAN 5 MG PO TABS: 5.0000 mg | ORAL_TABLET | Freq: Two times a day (BID) | ORAL | 1 refills | Status: AC

## 2024-07-23 NOTE — Telephone Encounter (Signed)
 Pt last saw Dr Cindie 07/17/24, last labs 07/17/24 Creat 0.96, age 83, weight 64kg, based on specified criteria pt is on appropriate dosage of Eliquis  5mg  BID for afib.  Will refill rx.

## 2024-07-24 ENCOUNTER — Encounter: Payer: Self-pay | Admitting: Cardiology

## 2024-07-24 ENCOUNTER — Ambulatory Visit: Attending: Cardiology | Admitting: Cardiology

## 2024-07-24 VITALS — BP 136/80 | HR 72 | Ht 60.0 in | Wt 140.0 lb

## 2024-07-24 DIAGNOSIS — I480 Paroxysmal atrial fibrillation: Secondary | ICD-10-CM | POA: Diagnosis not present

## 2024-07-24 DIAGNOSIS — I2510 Atherosclerotic heart disease of native coronary artery without angina pectoris: Secondary | ICD-10-CM | POA: Diagnosis not present

## 2024-07-24 DIAGNOSIS — I10 Essential (primary) hypertension: Secondary | ICD-10-CM

## 2024-07-24 DIAGNOSIS — I5189 Other ill-defined heart diseases: Secondary | ICD-10-CM | POA: Diagnosis not present

## 2024-07-24 NOTE — Progress Notes (Signed)
 " Cardiology Office Note:    Date:  07/24/2024   ID:  TAWANNA Coleman, DOB March 22, 1941, MRN [id number]  PCP:  Charlene Amis, MD  [facility] Cardiologist:  Charlene Cave, MD  [facility] Electrophysiologist:  Charlene Coleman HOLTS, MD   Referring MD: Charlene Amis, MD   Chief Complaint  Patient presents with   Follow-up    History of Present Illness:    Charlene Coleman is a 83 y.o. female with a hx of paroxysmal atrial fibrillation s/p ablation x 2 (09/2018, 03/2023) currently on Tikosyn ,  hypertension, hyperlipidemia, non obstructive CAD (LHC 6/23: 25% pLAD, 50% dRCA), HFpEF who presents for follow-up.   Last seen due to elevated BP, previously prescribed Norvasc  2.5 mg daily but patient became hypotensive.  Norvasc  was reduced to 1.25 mg daily with good effect.  BP at home ranges in the low 100s to 140s/150s systolic.  Overall feels well, denies any dizziness, or pre-syncope.  Feels well, has no concerns at this time.   Prior notes LHC 6/23: 25% pLAD, 50% dRCA) Cardiac monitor in the past revealed atrial fibrillation with 12.68% burden.  LHC 2009 showed moderate stenosis 50% in D1, RCA 09/2007 .   Echo 03/2020 normal systolic function, grade 2 diastolic dysfunction, moderate pulmonary hypertension.  Past Medical History:  Diagnosis Date   (HFpEF) heart failure with preserved ejection fraction (HCC)    a. 06/2021 Echo: EF 60-65%, no rwma, mild asymm LVH of basal-septal segment. Nl RV fxn. RVSP 23.52mmHg. Mild MR. AoV sclerosis w/o stenosis.   Aortic valve sclerosis    a. 06/2021 Echo: Ao sclerosis w/o stenosis.   Borderline diabetes    Breast cancer (HCC) 2015   Left side - radiation - lumpectomy   CAD (coronary artery disease)    a. 12/2021 Cath: LM nl, LAD 25p/m, D1 25, RI nl, LCX nl, RCA 20/30p, 44m->Med rx.   DCIS (ductal carcinoma in situ)    Dyspnea    Dysrhythmia    Empty sella syndrome    GERD (gastroesophageal reflux disease)    Gout    Hyperlipidemia     Hypertension    PAF (paroxysmal atrial fibrillation) (HCC)    a. Amio stopped 2/2 thyroid  dysfunction; b. 09/2021 s/p PVI.   Personal history of radiation therapy    Skin cancer 2011    Past Surgical History:  Procedure Laterality Date   ABDOMINAL HYSTERECTOMY     ATRIAL FIBRILLATION ABLATION N/A 09/21/2021   Procedure: ATRIAL FIBRILLATION ABLATION;  Surgeon: Holts Charlene ONEIDA, MD;  Location: [facility];  Service: Cardiovascular;  Laterality: N/A;   ATRIAL FIBRILLATION ABLATION N/A 03/21/2023   Procedure: ATRIAL FIBRILLATION ABLATION;  Surgeon: Holts Charlene ONEIDA, MD;  Location: [facility] CV [facility];  Service: Cardiovascular;  Laterality: N/A;   BREAST BIOPSY Left 2015   positive   BREAST LUMPECTOMY Left 09/23/2013   CARDIOVERSION N/A 12/28/2022   Procedure: CARDIOVERSION;  Surgeon: Coleman Redell, MD;  Location: [facility] ORS;  Service: Cardiovascular;  Laterality: N/A;   CATARACT EXTRACTION Bilateral    CHOLECYSTECTOMY     ELECTROPHYSIOLOGIC STUDY     GASTRIC FUNDOPLICATION     JOINT REPLACEMENT Right 09/22/2020   R hip replacement   LEFT HEART CATH AND CORONARY ANGIOGRAPHY N/A 12/30/2021   Procedure: LEFT HEART CATH AND CORONARY ANGIOGRAPHY;  Surgeon: Charlene Bruckner, MD;  Location: [facility] INVASIVE CV LAB;  Service: Cardiovascular;  Laterality: N/A;   left lumpectomy      Current Medications: Current Meds  Medication  Sig   allopurinol  (ZYLOPRIM ) 300 MG tablet Take 300 mg by mouth daily.    amLODipine  (NORVASC ) 2.5 MG tablet Take 0.5 tablets (1.25 mg total) by mouth daily.   apixaban  (ELIQUIS ) 5 MG TABS tablet Take 1 tablet (5 mg total) by mouth 2 (two) times daily.   atorvastatin  (LIPITOR) 20 MG tablet Take 20 mg by mouth every evening.   carboxymethylcellulose (REFRESH PLUS) 0.5 % SOLN Place 1 drop into both eyes daily as needed (dry eyes).   dofetilide  (TIKOSYN ) 250 MCG capsule Take 1 capsule (250 mcg total) by mouth 2 (two) times daily.   famotidine  (PEPCID ) 20 MG  tablet Take 20 mg by mouth at bedtime.   furosemide  (LASIX ) 20 MG tablet Take 1 tablet (20 mg total) by mouth daily. For swelling.   gabapentin (NEURONTIN) 300 MG capsule Take 300 mg by mouth 2 (two) times daily at 10 AM and 5 PM.   irbesartan  (AVAPRO ) 300 MG tablet Take 1 tablet (300 mg total) by mouth daily.   oxybutynin  (DITROPAN -XL) 5 MG 24 hr tablet Take 5 mg by mouth in the morning.   potassium chloride  SA (KLOR-CON  M) 20 MEQ tablet Take 1 tablet (20 mEq total) by mouth daily.   Probiotic Product (ADVANCED PROBIOTIC PO) Take 1 tablet by mouth every morning.     Allergies:   Enalapril, Amiodarone , and Tylenol  [acetaminophen ]   Social History   Socioeconomic History   Marital status: Married    Spouse name: Charlene Coleman   Number of children: 1   Years of education: Not on file   Highest education level: Not on file  Occupational History   Not on file  Tobacco Use   Smoking status: Never   Smokeless tobacco: Never   Tobacco comments:    Never smoke 10/19/21  Vaping Use   Vaping status: Never Used  Substance and Sexual Activity   Alcohol use: No   Drug use: No   Sexual activity: Not on file  Other Topics Concern   Not on file  Social History Narrative   Lives at home with spouse   Social Drivers of Health   Tobacco Use: Low Risk (07/24/2024)   Patient History    Smoking Tobacco Use: Never    Smokeless Tobacco Use: Never    Passive Exposure: Not on file  Financial Resource Strain: Not on file  Food Insecurity: Not on file  Transportation Needs: Not on file  Physical Activity: Not on file  Stress: Not on file  Social Connections: Not on file  Depression (EYV7-0): Not on file  Alcohol Screen: Not on file  Housing: Unknown (08/08/2023)   Received from [facility]   Epic    At any time in the past 12 months, were you homeless or living in a shelter (including now)?: No    Number of Times Moved in the Last Year: Not on file    Unable to Pay for Housing  in the Last Year: Not on file  Utilities: Not on file  Health Literacy: Not on file     Family History: The patient's family history includes Breast cancer in her cousin; Breast cancer (age of onset: 49) in her maternal aunt.  ROS:   Please see the history of present illness.     All other systems reviewed and are negative.  EKGs/Labs/Other Studies Reviewed:    The following studies were reviewed today:        Recent Labs: 04/05/2024: ALT 10; B Natriuretic Peptide  100.9; Hemoglobin 12.2; Platelets 224; TSH 2.620 07/17/2024: BUN 11; Creatinine, Ser 0.96; Magnesium  1.9; Potassium 4.6; Sodium 139  Recent Lipid Panel    Component Value Date/Time   CHOL 170 06/15/2022 1157   TRIG 83 06/15/2022 1157   HDL 67 06/15/2022 1157   CHOLHDL 2.5 06/15/2022 1157   VLDL 17 06/15/2022 1157   LDLCALC 86 06/15/2022 1157    Physical Exam:    VS:  BP 136/80 (BP Location: Left Arm, Patient Position: Sitting, Cuff Size: Normal)   Pulse 72   Ht 5' (1.524 m)   Wt 140 lb (63.5 kg)   SpO2 95%   BMI 27.34 kg/m     Wt Readings from Last 3 Encounters:  07/24/24 140 lb (63.5 kg)  07/17/24 141 lb (64 kg)  06/07/24 143 lb 9.6 oz (65.1 kg)     GEN:  Well nourished, well developed in no acute distress HEENT: Normal NECK: No JVD; No carotid bruits CARDIAC: Bradycardic, regular, no murmurs, rubs, gallops RESPIRATORY:  Clear to auscultation without rales, wheezing or rhonchi  ABDOMEN: Soft, non-tender, non-distended MUSCULOSKELETAL:  No edema; No deformity  SKIN: Warm and dry NEUROLOGIC:  Alert and oriented x 3 PSYCHIATRIC:  Normal affect   ASSESSMENT:    1. Coronary artery disease involving native coronary artery of native heart without angina pectoris   2. Diastolic dysfunction   3. Paroxysmal atrial fibrillation (HCC)   4. Primary hypertension    PLAN:    In order of problems listed above:  CAD, moderate stenosis in the first diagonal and RCA. denies chest pain.  Continue Eliquis ,  Lipitor.  Echo 03/2020 EF 55 to 60%.   Grade 2 diastolic dysfunction.  Appears euvolemic, no edema, continue Lasix  20 mg daily. paroxysmal atrial fibrillation, s/p RFA 09/2021, 03/2023.  On Tikosyn  250 mg twice daily, Eliquis  5 mg twice daily.  Toprol -XL previously stopped due to bradycardia.   hypertension, blood pressure controlled.  Continue Norvasc  1.25 mg daily,  irbesartan  300 mg daily.  Check BP at home and keep a log.  History of hypotension with Norvasc  2.5 mg daily.  Follow-up in 1 year.  Medication Adjustments/Labs and Tests Ordered: Current medicines are reviewed at length with the patient today.  Concerns regarding medicines are outlined above.  No orders of the defined types were placed in this encounter.    No orders of the defined types were placed in this encounter.     Patient Instructions  Medication Instructions:  Your physician recommends that you continue on your current medications as directed. Please refer to the Current Medication list given to you today.   *If you need a refill on your cardiac medications before your next appointment, please call your pharmacy*  Lab Work: No labs ordered today  If you have labs (blood work) drawn today and your tests are completely normal, you will receive your results only by: MyChart Message (if you have MyChart) OR A paper copy in the mail If you have any lab test that is abnormal or we need to change your treatment, we will call you to review the results.  Testing/Procedures: No test ordered today   Follow-Up: At [facility], you and your health needs are our priority.  As part of our continuing mission to provide you with exceptional heart care, our providers are all part of one team.  This team includes your primary Cardiologist (physician) and Advanced Practice Providers or APPs (Physician Assistants and Nurse Practitioners) who all work together to  provide you with the care you need, when you need it.  Your  next appointment:   1 year(s)  Provider:   You may see Charlene Cave, MD or one of the following Advanced Practice Providers on your designated Care Team:   Lonni Meager, NP Lesley Maffucci, PA-C Bernardino Bring, PA-C Cadence Shiro, PA-C Tylene Lunch, NP Barnie Hila, NP    We recommend signing up for the patient portal called MyChart.  Sign up information is provided on this After Visit Summary.  MyChart is used to connect with patients for Virtual Visits (Telemedicine).  Patients are able to view lab/test results, encounter notes, upcoming appointments, etc.  Non-urgent messages can be sent to your provider as well.   To learn more about what you can do with MyChart, go to [email or web address].         Signed, Charlene Cave, MD  07/24/2024 10:07 AM    [facility] "

## 2024-07-24 NOTE — Patient Instructions (Signed)

## 2024-07-30 ENCOUNTER — Ambulatory Visit

## 2024-08-06 ENCOUNTER — Ambulatory Visit

## 2024-08-13 ENCOUNTER — Ambulatory Visit

## 2024-08-23 ENCOUNTER — Other Ambulatory Visit: Payer: Self-pay

## 2024-08-29 ENCOUNTER — Other Ambulatory Visit: Payer: Self-pay | Admitting: Cardiology

## 2024-09-03 ENCOUNTER — Other Ambulatory Visit: Payer: Self-pay

## 2024-09-03 MED ORDER — FUROSEMIDE 20 MG PO TABS: 20.0000 mg | ORAL_TABLET | Freq: Every day | ORAL | 3 refills | Status: AC

## 2024-09-03 MED ORDER — GABAPENTIN 300 MG PO CAPS: 300.0000 mg | ORAL_CAPSULE | Freq: Two times a day (BID) | ORAL | 3 refills | Status: AC

## 2024-09-03 MED ORDER — AMLODIPINE BESYLATE 2.5 MG PO TABS: 1.2500 mg | ORAL_TABLET | Freq: Every day | ORAL | 2 refills | Status: AC

## 2024-09-03 MED ORDER — ATORVASTATIN CALCIUM 20 MG PO TABS: 20.0000 mg | ORAL_TABLET | Freq: Every day | ORAL | 1 refills | Status: AC

## 2024-09-03 MED ORDER — ALLOPURINOL 300 MG PO TABS: 300.0000 mg | ORAL_TABLET | Freq: Every day | ORAL | 3 refills | Status: AC

## 2024-09-03 MED ORDER — APIXABAN 5 MG PO TABS: 5.0000 mg | ORAL_TABLET | Freq: Two times a day (BID) | ORAL | 1 refills | Status: AC

## 2024-09-03 MED ORDER — IRBESARTAN 300 MG PO TABS: 300.0000 mg | ORAL_TABLET | Freq: Every day | ORAL | 3 refills | Status: AC

## 2024-09-06 ENCOUNTER — Other Ambulatory Visit: Payer: Self-pay

## 2024-09-06 MED ORDER — ONDANSETRON 4 MG PO TBDP
4.0000 mg | ORAL_TABLET | Freq: Three times a day (TID) | ORAL | 0 refills | Status: AC | PRN
  Filled 2024-09-06: qty 30, 10d supply, fill #0

## 2024-11-15 ENCOUNTER — Ambulatory Visit: Admitting: Cardiology
